# Patient Record
Sex: Male | Born: 1945 | Race: White | Hispanic: No | Marital: Married | Smoking: Former smoker
Health system: Southern US, Community
[De-identification: ages and names within clinical notes are randomized; demographics above are authoritative.]

## PROBLEM LIST (undated history)

## (undated) DIAGNOSIS — N529 Male erectile dysfunction, unspecified: Secondary | ICD-10-CM

## (undated) DIAGNOSIS — F329 Major depressive disorder, single episode, unspecified: Secondary | ICD-10-CM

## (undated) DIAGNOSIS — J342 Deviated nasal septum: Secondary | ICD-10-CM

## (undated) DIAGNOSIS — I10 Essential (primary) hypertension: Secondary | ICD-10-CM

## (undated) DIAGNOSIS — E785 Hyperlipidemia, unspecified: Secondary | ICD-10-CM

## (undated) DIAGNOSIS — H9319 Tinnitus, unspecified ear: Secondary | ICD-10-CM

## (undated) DIAGNOSIS — F419 Anxiety disorder, unspecified: Secondary | ICD-10-CM

## (undated) DIAGNOSIS — K5790 Diverticulosis of intestine, part unspecified, without perforation or abscess without bleeding: Secondary | ICD-10-CM

## (undated) DIAGNOSIS — E669 Obesity, unspecified: Secondary | ICD-10-CM

## (undated) DIAGNOSIS — F32A Depression, unspecified: Secondary | ICD-10-CM

## (undated) DIAGNOSIS — K649 Unspecified hemorrhoids: Secondary | ICD-10-CM

## (undated) DIAGNOSIS — G4733 Obstructive sleep apnea (adult) (pediatric): Secondary | ICD-10-CM

## (undated) DIAGNOSIS — K8020 Calculus of gallbladder without cholecystitis without obstruction: Secondary | ICD-10-CM

## (undated) DIAGNOSIS — I2510 Atherosclerotic heart disease of native coronary artery without angina pectoris: Secondary | ICD-10-CM

## (undated) DIAGNOSIS — I219 Acute myocardial infarction, unspecified: Secondary | ICD-10-CM

## (undated) HISTORY — DX: Essential (primary) hypertension: I10

## (undated) HISTORY — DX: Depression, unspecified: F32.A

## (undated) HISTORY — DX: Hyperlipidemia, unspecified: E78.5

## (undated) HISTORY — PX: TONSILLECTOMY: SUR1361

## (undated) HISTORY — PX: APPENDECTOMY: SHX54

## (undated) HISTORY — DX: Tinnitus, unspecified ear: H93.19

## (undated) HISTORY — DX: Major depressive disorder, single episode, unspecified: F32.9

## (undated) HISTORY — DX: Male erectile dysfunction, unspecified: N52.9

## (undated) HISTORY — DX: Deviated nasal septum: J34.2

## (undated) HISTORY — PX: SPLENECTOMY: SUR1306

## (undated) HISTORY — DX: Obesity, unspecified: E66.9

## (undated) HISTORY — DX: Obstructive sleep apnea (adult) (pediatric): G47.33

---

## 2001-08-27 HISTORY — PX: CORONARY ANGIOPLASTY WITH STENT PLACEMENT: SHX49

## 2002-07-31 ENCOUNTER — Emergency Department (HOSPITAL_COMMUNITY): Admission: EM | Admit: 2002-07-31 | Discharge: 2002-07-31 | Payer: Self-pay | Admitting: Internal Medicine

## 2002-07-31 ENCOUNTER — Encounter: Payer: Self-pay | Admitting: Internal Medicine

## 2002-08-03 ENCOUNTER — Inpatient Hospital Stay (HOSPITAL_COMMUNITY): Admission: RE | Admit: 2002-08-03 | Discharge: 2002-08-06 | Payer: Self-pay | Admitting: *Deleted

## 2002-08-03 ENCOUNTER — Encounter: Payer: Self-pay | Admitting: *Deleted

## 2003-06-26 ENCOUNTER — Inpatient Hospital Stay (HOSPITAL_COMMUNITY): Admission: EM | Admit: 2003-06-26 | Discharge: 2003-06-27 | Payer: Self-pay

## 2006-12-03 ENCOUNTER — Emergency Department (HOSPITAL_COMMUNITY): Admission: EM | Admit: 2006-12-03 | Discharge: 2006-12-03 | Payer: Self-pay | Admitting: Emergency Medicine

## 2011-03-22 ENCOUNTER — Other Ambulatory Visit: Payer: Self-pay | Admitting: Neurology

## 2011-03-22 DIAGNOSIS — G479 Sleep disorder, unspecified: Secondary | ICD-10-CM

## 2011-03-22 DIAGNOSIS — R443 Hallucinations, unspecified: Secondary | ICD-10-CM

## 2011-04-10 ENCOUNTER — Ambulatory Visit
Admission: RE | Admit: 2011-04-10 | Discharge: 2011-04-10 | Disposition: A | Discharge status: Home / Self Care | Payer: Federal, State, Local not specified - PPO | Source: Ambulatory Visit | Attending: Neurology | Admitting: Neurology

## 2011-04-10 DIAGNOSIS — G479 Sleep disorder, unspecified: Secondary | ICD-10-CM

## 2011-04-10 DIAGNOSIS — R443 Hallucinations, unspecified: Secondary | ICD-10-CM

## 2011-11-09 DIAGNOSIS — J329 Chronic sinusitis, unspecified: Secondary | ICD-10-CM | POA: Diagnosis not present

## 2011-11-09 DIAGNOSIS — I10 Essential (primary) hypertension: Secondary | ICD-10-CM | POA: Diagnosis not present

## 2011-11-09 DIAGNOSIS — G4733 Obstructive sleep apnea (adult) (pediatric): Secondary | ICD-10-CM | POA: Diagnosis not present

## 2011-11-09 DIAGNOSIS — J301 Allergic rhinitis due to pollen: Secondary | ICD-10-CM | POA: Diagnosis not present

## 2011-12-17 DIAGNOSIS — H9319 Tinnitus, unspecified ear: Secondary | ICD-10-CM | POA: Diagnosis not present

## 2011-12-17 DIAGNOSIS — J329 Chronic sinusitis, unspecified: Secondary | ICD-10-CM | POA: Diagnosis not present

## 2011-12-17 DIAGNOSIS — J342 Deviated nasal septum: Secondary | ICD-10-CM | POA: Diagnosis not present

## 2011-12-20 DIAGNOSIS — J329 Chronic sinusitis, unspecified: Secondary | ICD-10-CM | POA: Diagnosis not present

## 2012-01-10 DIAGNOSIS — J310 Chronic rhinitis: Secondary | ICD-10-CM | POA: Diagnosis not present

## 2012-01-10 DIAGNOSIS — J342 Deviated nasal septum: Secondary | ICD-10-CM | POA: Diagnosis not present

## 2012-01-10 DIAGNOSIS — J320 Chronic maxillary sinusitis: Secondary | ICD-10-CM | POA: Diagnosis not present

## 2012-01-10 DIAGNOSIS — H9319 Tinnitus, unspecified ear: Secondary | ICD-10-CM | POA: Diagnosis not present

## 2012-01-31 DIAGNOSIS — J342 Deviated nasal septum: Secondary | ICD-10-CM | POA: Diagnosis not present

## 2012-01-31 DIAGNOSIS — J320 Chronic maxillary sinusitis: Secondary | ICD-10-CM | POA: Diagnosis not present

## 2012-03-26 DIAGNOSIS — I10 Essential (primary) hypertension: Secondary | ICD-10-CM | POA: Diagnosis not present

## 2012-03-26 DIAGNOSIS — J320 Chronic maxillary sinusitis: Secondary | ICD-10-CM | POA: Diagnosis not present

## 2012-03-27 HISTORY — PX: CORONARY ANGIOPLASTY WITH STENT PLACEMENT: SHX49

## 2012-04-08 DIAGNOSIS — E669 Obesity, unspecified: Secondary | ICD-10-CM | POA: Diagnosis not present

## 2012-04-08 DIAGNOSIS — I10 Essential (primary) hypertension: Secondary | ICD-10-CM | POA: Diagnosis not present

## 2012-04-08 DIAGNOSIS — R0789 Other chest pain: Secondary | ICD-10-CM | POA: Diagnosis not present

## 2012-04-09 ENCOUNTER — Encounter: Payer: Self-pay | Admitting: *Deleted

## 2012-04-10 ENCOUNTER — Other Ambulatory Visit: Payer: Self-pay

## 2012-04-10 ENCOUNTER — Ambulatory Visit (HOSPITAL_COMMUNITY): Admit: 2012-04-10 | Payer: Federal, State, Local not specified - PPO | Admitting: Cardiovascular Disease

## 2012-04-10 ENCOUNTER — Encounter (HOSPITAL_COMMUNITY): Payer: Self-pay | Admitting: Emergency Medicine

## 2012-04-10 ENCOUNTER — Encounter: Payer: Self-pay | Admitting: Cardiology

## 2012-04-10 ENCOUNTER — Encounter: Payer: Federal, State, Local not specified - PPO | Admitting: Cardiology

## 2012-04-10 ENCOUNTER — Encounter (HOSPITAL_COMMUNITY)
Admission: EM | Disposition: A | Discharge status: Home / Self Care | Payer: Self-pay | Source: Home / Self Care | Attending: Cardiovascular Disease

## 2012-04-10 ENCOUNTER — Emergency Department (HOSPITAL_COMMUNITY): Payer: Federal, State, Local not specified - PPO

## 2012-04-10 ENCOUNTER — Inpatient Hospital Stay (HOSPITAL_COMMUNITY)
Admission: EM | Admit: 2012-04-10 | Discharge: 2012-04-13 | DRG: 853 | Disposition: A | Discharge status: Home / Self Care | Payer: Federal, State, Local not specified - PPO | Attending: Cardiovascular Disease | Admitting: Cardiovascular Disease

## 2012-04-10 DIAGNOSIS — E785 Hyperlipidemia, unspecified: Secondary | ICD-10-CM | POA: Diagnosis present

## 2012-04-10 DIAGNOSIS — Z87891 Personal history of nicotine dependence: Secondary | ICD-10-CM

## 2012-04-10 DIAGNOSIS — Z955 Presence of coronary angioplasty implant and graft: Secondary | ICD-10-CM

## 2012-04-10 DIAGNOSIS — I2510 Atherosclerotic heart disease of native coronary artery without angina pectoris: Secondary | ICD-10-CM

## 2012-04-10 DIAGNOSIS — D72829 Elevated white blood cell count, unspecified: Secondary | ICD-10-CM | POA: Diagnosis present

## 2012-04-10 DIAGNOSIS — F329 Major depressive disorder, single episode, unspecified: Secondary | ICD-10-CM | POA: Diagnosis present

## 2012-04-10 DIAGNOSIS — I213 ST elevation (STEMI) myocardial infarction of unspecified site: Secondary | ICD-10-CM | POA: Diagnosis present

## 2012-04-10 DIAGNOSIS — I2109 ST elevation (STEMI) myocardial infarction involving other coronary artery of anterior wall: Secondary | ICD-10-CM

## 2012-04-10 DIAGNOSIS — E876 Hypokalemia: Secondary | ICD-10-CM | POA: Diagnosis not present

## 2012-04-10 DIAGNOSIS — R079 Chest pain, unspecified: Secondary | ICD-10-CM | POA: Diagnosis not present

## 2012-04-10 DIAGNOSIS — R7309 Other abnormal glucose: Secondary | ICD-10-CM | POA: Diagnosis present

## 2012-04-10 DIAGNOSIS — G4733 Obstructive sleep apnea (adult) (pediatric): Secondary | ICD-10-CM | POA: Diagnosis present

## 2012-04-10 DIAGNOSIS — I517 Cardiomegaly: Secondary | ICD-10-CM | POA: Diagnosis not present

## 2012-04-10 DIAGNOSIS — E669 Obesity, unspecified: Secondary | ICD-10-CM | POA: Diagnosis present

## 2012-04-10 DIAGNOSIS — I10 Essential (primary) hypertension: Secondary | ICD-10-CM | POA: Diagnosis not present

## 2012-04-10 DIAGNOSIS — F3289 Other specified depressive episodes: Secondary | ICD-10-CM | POA: Diagnosis present

## 2012-04-10 DIAGNOSIS — I219 Acute myocardial infarction, unspecified: Secondary | ICD-10-CM | POA: Diagnosis not present

## 2012-04-10 DIAGNOSIS — Z79899 Other long term (current) drug therapy: Secondary | ICD-10-CM

## 2012-04-10 HISTORY — PX: LEFT HEART CATHETERIZATION WITH CORONARY ANGIOGRAM: SHX5451

## 2012-04-10 LAB — PROTIME-INR: INR: 0.96 (ref 0.00–1.49)

## 2012-04-10 LAB — CBC
HCT: 40.7 % (ref 39.0–52.0)
MCH: 31.6 pg (ref 26.0–34.0)
MCH: 31.6 pg (ref 26.0–34.0)
MCHC: 35.6 g/dL (ref 30.0–36.0)
MCV: 88.6 fL (ref 78.0–100.0)
MCV: 88.1 fL (ref 78.0–100.0)
Platelets: 323 10*3/uL (ref 150–400)
RBC: 4.62 MIL/uL (ref 4.22–5.81)
RDW: 13.9 % (ref 11.5–15.5)
WBC: 20.3 10*3/uL — ABNORMAL HIGH (ref 4.0–10.5)

## 2012-04-10 LAB — COMPREHENSIVE METABOLIC PANEL
AST: 39 U/L — ABNORMAL HIGH (ref 0–37)
Albumin: 4.4 g/dL (ref 3.5–5.2)
CO2: 28 mEq/L (ref 19–32)
Calcium: 10.4 mg/dL (ref 8.4–10.5)
Creatinine, Ser: 1.23 mg/dL (ref 0.50–1.35)
GFR calc non Af Amer: 59 mL/min — ABNORMAL LOW (ref >90)
Total Protein: 8.6 g/dL — ABNORMAL HIGH (ref 6.0–8.3)

## 2012-04-10 LAB — CARDIAC PANEL(CRET KIN+CKTOT+MB+TROPI): CK, MB: 129.1 ng/mL (ref 0.3–4.0)

## 2012-04-10 LAB — APTT: aPTT: 24 seconds (ref 24–37)

## 2012-04-10 LAB — POCT ACTIVATED CLOTTING TIME: Activated Clotting Time: 160 seconds

## 2012-04-10 SURGERY — LEFT HEART CATHETERIZATION WITH CORONARY ANGIOGRAM
Anesthesia: LOCAL

## 2012-04-10 MED ORDER — ONDANSETRON HCL 4 MG/2ML IJ SOLN
INTRAMUSCULAR | Status: AC
  Administered 2012-04-10: 4 mg
  Filled 2012-04-10: qty 2

## 2012-04-10 MED ORDER — NITROGLYCERIN IN D5W 200-5 MCG/ML-% IV SOLN
5.0000 ug/min | Freq: Once | INTRAVENOUS | Status: AC
  Administered 2012-04-10: 5 ug/min via INTRAVENOUS

## 2012-04-10 MED ORDER — EPTIFIBATIDE 75 MG/100ML IV SOLN
INTRAVENOUS | Status: AC
  Administered 2012-04-10: 2.003 ug/kg/min via INTRAVENOUS
  Filled 2012-04-10: qty 100

## 2012-04-10 MED ORDER — ACETAMINOPHEN 325 MG PO TABS
650.0000 mg | ORAL_TABLET | ORAL | Status: DC | PRN
  Administered 2012-04-11 – 2012-04-12 (×4): 650 mg via ORAL
  Filled 2012-04-10 (×4): qty 2

## 2012-04-10 MED ORDER — SODIUM CHLORIDE 0.9 % IV SOLN
0.2500 mg/kg/h | INTRAVENOUS | Status: DC
  Filled 2012-04-10: qty 250

## 2012-04-10 MED ORDER — SODIUM CHLORIDE 0.9 % IV SOLN: INTRAVENOUS | Status: AC

## 2012-04-10 MED ORDER — MORPHINE SULFATE 4 MG/ML IJ SOLN
INTRAMUSCULAR | Status: AC
  Administered 2012-04-10: 4 mg
  Filled 2012-04-10: qty 1

## 2012-04-10 MED ORDER — HYDRALAZINE HCL 20 MG/ML IJ SOLN
INTRAMUSCULAR | Status: AC
  Filled 2012-04-10: qty 1

## 2012-04-10 MED ORDER — SODIUM CHLORIDE 0.9 % IV SOLN
INTRAVENOUS | Status: DC
  Filled 2012-04-10: qty 250

## 2012-04-10 MED ORDER — PRASUGREL HCL 10 MG PO TABS
10.0000 mg | ORAL_TABLET | Freq: Every day | ORAL | Status: DC
  Administered 2012-04-11 – 2012-04-13 (×3): 10 mg via ORAL
  Filled 2012-04-10 (×3): qty 1

## 2012-04-10 MED ORDER — HYDRALAZINE HCL 20 MG/ML IJ SOLN: 10.0000 mg | INTRAMUSCULAR | Status: DC | PRN

## 2012-04-10 MED ORDER — HEPARIN (PORCINE) IN NACL 100-0.45 UNIT/ML-% IJ SOLN
INTRAMUSCULAR | Status: AC
  Filled 2012-04-10: qty 250

## 2012-04-10 MED ORDER — NITROGLYCERIN IN D5W 200-5 MCG/ML-% IV SOLN
INTRAVENOUS | Status: AC
  Filled 2012-04-10: qty 250

## 2012-04-10 MED ORDER — HEPARIN BOLUS VIA INFUSION
4000.0000 [IU] | Freq: Once | INTRAVENOUS | Status: AC
  Administered 2012-04-10: 4000 [IU] via INTRAVENOUS

## 2012-04-10 MED ORDER — METOPROLOL TARTRATE 1 MG/ML IV SOLN
INTRAVENOUS | Status: AC
  Filled 2012-04-10: qty 5

## 2012-04-10 MED ORDER — FENTANYL CITRATE 0.05 MG/ML IJ SOLN
INTRAMUSCULAR | Status: AC
  Filled 2012-04-10: qty 2

## 2012-04-10 MED ORDER — LIDOCAINE HCL (PF) 1 % IJ SOLN
INTRAMUSCULAR | Status: AC
  Filled 2012-04-10: qty 30

## 2012-04-10 MED ORDER — NITROGLYCERIN IN D5W 200-5 MCG/ML-% IV SOLN: 2.0000 ug/min | INTRAVENOUS | Status: DC

## 2012-04-10 MED ORDER — NITROGLYCERIN 0.2 MG/ML ON CALL CATH LAB
INTRAVENOUS | Status: AC
  Filled 2012-04-10: qty 1

## 2012-04-10 MED ORDER — MORPHINE SULFATE 10 MG/ML IJ SOLN
INTRAMUSCULAR | Status: AC
  Filled 2012-04-10: qty 1

## 2012-04-10 MED ORDER — HEPARIN (PORCINE) IN NACL 100-0.45 UNIT/ML-% IJ SOLN
1000.0000 [IU]/h | INTRAMUSCULAR | Status: DC
  Administered 2012-04-10: 1000 [IU]/h via INTRAVENOUS
  Filled 2012-04-10: qty 250

## 2012-04-10 MED ORDER — METOPROLOL TARTRATE 1 MG/ML IV SOLN: 5.0000 mg | Freq: Once | INTRAVENOUS | Status: DC

## 2012-04-10 MED ORDER — MIDAZOLAM HCL 2 MG/2ML IJ SOLN
INTRAMUSCULAR | Status: AC
  Filled 2012-04-10: qty 2

## 2012-04-10 MED ORDER — PRASUGREL HCL 10 MG PO TABS
ORAL_TABLET | ORAL | Status: AC
  Filled 2012-04-10: qty 6

## 2012-04-10 MED ORDER — ONDANSETRON HCL 4 MG/2ML IJ SOLN
4.0000 mg | Freq: Four times a day (QID) | INTRAMUSCULAR | Status: DC | PRN
  Filled 2012-04-10: qty 2

## 2012-04-10 MED ORDER — SODIUM CHLORIDE 0.9 % IV SOLN
1000.0000 mL | INTRAVENOUS | Status: DC
Start: 2012-04-10 — End: 2012-04-10

## 2012-04-10 MED ORDER — METOPROLOL TARTRATE 25 MG PO TABS
25.0000 mg | ORAL_TABLET | Freq: Two times a day (BID) | ORAL | Status: DC
  Administered 2012-04-10 – 2012-04-13 (×6): 25 mg via ORAL
  Filled 2012-04-10 (×8): qty 1

## 2012-04-10 MED ORDER — ATORVASTATIN CALCIUM 80 MG PO TABS
80.0000 mg | ORAL_TABLET | Freq: Every day | ORAL | Status: DC
  Administered 2012-04-10 – 2012-04-12 (×3): 80 mg via ORAL
  Filled 2012-04-10 (×4): qty 1

## 2012-04-10 MED ORDER — HEPARIN (PORCINE) IN NACL 2-0.9 UNIT/ML-% IJ SOLN
INTRAMUSCULAR | Status: AC
  Filled 2012-04-10: qty 3000

## 2012-04-10 MED ORDER — BIVALIRUDIN 250 MG IV SOLR
INTRAVENOUS | Status: AC
  Filled 2012-04-10: qty 250

## 2012-04-10 MED ORDER — EPTIFIBATIDE 75 MG/100ML IV SOLN
2.0000 ug/kg/min | INTRAVENOUS | Status: DC
  Administered 2012-04-10: 2.003 ug/kg/min via INTRAVENOUS
  Administered 2012-04-11: 2 ug/kg/min via INTRAVENOUS
  Filled 2012-04-10 (×3): qty 100

## 2012-04-10 MED ORDER — POTASSIUM CHLORIDE CRYS ER 20 MEQ PO TBCR: 60.0000 meq | EXTENDED_RELEASE_TABLET | Freq: Once | ORAL | Status: DC

## 2012-04-10 NOTE — CV Procedure (Signed)
Cardiac Catheterization Operative Report  Bradley Hurley [id number] 8/15/20134:57 PM [name], MD  Procedure Performed:  1. Left Heart Catheterization 2. Selective Coronary Angiography 3. Left ventricular angiogram 4. Thrombectomy of the mid LAD with an Expressway thrombectomy catheter 5. PTCA/DES x 2 proximal and mid LAD  Operator: Verne Carrow, MD  Indication:  Acute anterior STEMI                                  Procedure Details: The patient was brought to the cath lab via EMS. He received 4000 Units heparin IV per EMS. No ASA secondary to ASA allergy. Emergency consent was obtained. The risks, benefits, complications, treatment options, and expected outcomes were discussed with the patient. The patient and/or family concurred with the proposed plan, giving verbal consent. The patient was brought to the cath lab after IV hydration was begun and oral premedication was given. The patient was further sedated with Versed and Fentanyl. The right groin was prepped and draped in the usual manner. Using the modified Seldinger access technique, a 6 French sheath was placed in the right femoral artery. A JR4 catheter was used to engage the RCA and perform selective angiography. I then engaged the left main with a XB LAD 3.5 guiding catheter. Angiography was performed. The LAD was totally occluded in the mid segment at the proximal edge of the old stent. He was given a bolus of Angiomax and a drip was started. He was given 60 mg po Effient. I then advanced a Cougar wire down the LAD through the totally occluded segment. Flow was re-established with the wire. I then used a 2.5 x 12 mm balloon x 1 to open the stenotic area. This was just prior to the old stent. There was a long segment of disease extending from the proximal vessel down through the more distal old stent. I deployed a 3.0 x 38 mm Promus Element DES in the mid vessel. I then deployed an overlapping 3.5 x 20 mm Promus  Element DES in the proximal vessel. The most distal newly placed stent was post-dilated with a 3.0 x 20 mm Harlan balloon. The more proximal newly placed stent was post-dilated with a 3.5 x 20 mm [place] balloon. There was an excellent result proximally and in the mid segment. The old stent in the mid vessel had a slight filling defect on the distal edge. The remainder of the distal LAD was diffusely diseased. I placed my 2.5 x 12 mm balloon in the distal edge of the old stent and inflated x 2 to see if this affected the small filling defect. This is likely residual downstream thrombus. The mid and distal LAD was too small and diffusely diseased for further stenting. I gave multiple rounds of NTG IC. He was given a bolus of Integrilin and a drip was started. The Angiomax was turned off.  I elected to treat him with Integrilin overnight in an attempt to allow the thrombus distally to break up. The guide and wire were removed. A pigtail catheter was used to perform a left ventricular angiogram. Right femoral artery angiogram showed that there was a high bifurcation and the sheath was in the profunda so no closure device was used.   There were no immediate complications. The patient was taken to the recovery area in stable condition.   Hemodynamic Findings: Central aortic pressure: 180/101 Left ventricular pressure: 172/11/15  Angiographic Findings:  Left  main:  Distal 10% plaque.   Left Anterior Descending Artery: Large caliber vessel that courses the apex. The proximal vessel has diffuse 50% stenosis. The mid vessel has 100% total occlusion just prior to the old stent. Two stents are visible in the mid vessel. There is a 5-8 mm area of vessel that is not stented between the stents.   Circumflex Artery: Large caliber vessel that terminates into a marginal branch. There is a 40% stenosis in the mid portion of the marginal branch.   Right Coronary Artery: Large dominant vessel with luminal irregularities in the  proximal and mid segment. The distal vessel has serial 30% stenoses. The PDA is a moderate caliber vessel with mild diffuse plaque. The PLA is a moderate sized vessel with mild diffuse plaque.   Left Ventricular Angiogram: LVEF=40%. Hypokinesis of the anterior wall and apex.   Impression: 1. Acute anterior STEMI 2. Occluded mid LAD, now s/p successful thrombectomy and PTCA with placement of 2 DES in the proximal and mid LAD.  3. Segment LV systolic dysfunction  Recommendations: Will continue Effient for one year. He is allergic to ASA. Start beta blocker, statin. Will continue Integrilin drip for 18 hours with large thrombus burden. Will continue NTG gtt with elevated BP. Echo in am. CCU given large anterior MI.        Complications:  None. The patient tolerated the procedure well.

## 2012-04-10 NOTE — Progress Notes (Signed)
CRITICAL VALUE ALERT  Critical value received:  ckmb 129.9, troponin >20.0  Date of notification:  04/10/12  Time of notification:  2000  Critical value read back:yes  Nurse who received alert:  Hillery Aldo RN  MD notified (1st page):  J. Hope   Time of first page:  2030  MD notified (2nd page):  Time of second page:  Responding MD:  J. Hope  Time MD responded:  2045

## 2012-04-10 NOTE — ED Provider Notes (Signed)
History  This chart was scribed for Carleene Cooper III, MD by Erskine Emery. This patient was seen in room APA07/APA07 and the patient's care was started at 14:30.    CSN: [id number]  Arrival date & time 04/10/12  1433   None     No chief complaint on file.   (Consider location/radiation/quality/duration/timing/severity/associated sxs/prior treatment) HPI Bradley Hurley is a 66 y.o. male who presents to the Emergency Department complaining of sudden onset severe chest pain with associated numbness but no radiation for about 10 minutes. Pt reports he has had some mild chest pain for 3 days but reports he slept fine last night. Pt had an appointment with [place] at 2:30 today but came here instead. Pt has not taken an aspirin because he is allergic. Pt has a h/o stent placement (2) in 2003 at [facility]. Pt also has a h/o ASCVD, HTN, hyperlipidemia, and obstructive sleep apnea and is on flonase, hydrodiurl, lisinopril, and pravachol. Pt does not smoke (he quit in 1982 after 15 years) and he drinks a little.   Past Medical History  Diagnosis Date  . 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 Hypertension     Lab 03/2012: Normal CMet, TSH, CBC, PSA  . Hyperlipidemia     Lipid profile in 03/2012:182, 409,81,191  . Obstructive sleep apnea 05/2011    [facility]-05/2011  . Tinnitus   . Erectile dysfunction     Low serum testosterone level  . Depression   . Obesity   . Deviated septum   . 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 Splenectomy   . Appendectomy     Family History  Problem Relation Age of Onset  . Arrhythmia Father   . Cancer Mother     History  Substance Use Topics  . Smoking status: Former Smoker -- 1.0 packs/day for 15 years  .  Smokeless tobacco: Former Neurosurgeon    Quit date: 01/30/1981   Comment: Quit in 1982  . Alcohol Use: 0.5 oz/week    1 drink(s) per week     Occational consumption      Review of Systems  Constitutional: Negative for fever, chills and activity change.  HENT: Negative for neck pain.   Eyes: Negative for discharge.  Respiratory: Negative for cough.   Cardiovascular: Positive for chest pain.  Gastrointestinal: Negative for nausea, vomiting and diarrhea.  Genitourinary: Negative for dysuria.  Musculoskeletal: Negative for back pain.  Skin: Negative for rash.  Neurological: Negative for syncope.  Psychiatric/Behavioral: Negative for confusion and disturbed wake/sleep cycle.    Allergies  Aspirin  Home Medications   Current Outpatient Rx  Name Route Sig Dispense Refill  . FLUTICASONE PROPIONATE 50 MCG/ACT NA SUSP Nasal Place 2 sprays into the nose daily.    Marland Kitchen HYDROCHLOROTHIAZIDE 25 MG PO TABS Oral Take 25 mg by mouth daily.    Marland Kitchen LISINOPRIL 20 MG PO TABS Oral Take 20 mg by mouth daily.    Marland Kitchen PRAVASTATIN SODIUM 40 MG PO TABS Oral Take 40 mg by mouth daily.      There were no vitals taken for this visit.  Physical Exam  Nursing note and vitals reviewed. Constitutional: He is oriented to person, place, and time. He appears well-developed and well-nourished.  No distress.  HENT:  Head: Normocephalic and atraumatic.  Eyes: EOM are normal.  Neck: Neck supple. No tracheal deviation present.  Pulmonary/Chest: He exhibits tenderness.       EKG shows acute MI  Abdominal: Soft. He exhibits no distension. There is no tenderness.  Musculoskeletal: Normal range of motion. He exhibits no edema.  Neurological: He is alert and oriented to person, place, and time.  Skin: Skin is warm and dry.  Psychiatric: He has a normal mood and affect.    ED Course  Procedures (including critical care time)   COORDINATION OF CARE: 14:30--I evaluated the patient and we discussed a treatment plan  including morphine, zophran, and nitroglycerine to which the pt agreed.  Pt's EKG shows acute MI.   14:56--I rechecked the pt and ordered him more pain medication (4 mg/ml of morphine)  15:00--I notified Dr. Gwyneth Sprout in [facility] ED that the pt would be there soon.    Date: 04/10/2012  Rate: 68  Rhythm: normal sinus rhythm  QRS Axis: normal  Intervals: normal QRS:  ST elevation in V1 - V4, Acute anterior myocardial infarction.  ST/T Wave abnormalities: ST elevations anteriorly  Conduction Disutrbances:none  Narrative Interpretation: Acute anterior myocardial infarction.  Old EKG Reviewed: none available  2:48 PM Pt seen --> physical exam performed.  EKG shows acute anterior myocardial infarction.  Call to [facility] STEMI --> spoke to Dr. Herbie Baltimore, who accepts pt in transfer to [facility].  He will notify [facility].         1. Acute myocardial infarction      I personally performed the services described in this documentation, which was scribed in my presence. The recorded information has been reviewed and considered.  Osvaldo Human, M.D.       Carleene Cooper III, MD 04/11/12 [phone]

## 2012-04-10 NOTE — Plan of Care (Signed)
Problem: Phase I Progression Outcomes Goal: Aspirin unless contraindicated Outcome: Completed/Met Date Met:  04/10/12 Pt allergic to ASA

## 2012-04-10 NOTE — H&P (Signed)
History and Physical  Patient ID: Bradley Hurley MRN: [id number], DOB: [birth year] Date of Encounter: 04/10/2012, 3:48 PM Primary Physician: [name], MD Primary Cardiologist: was scheduled to see Dr. Dietrich Pates for the first time today  Chief Complaint: chest pain Reason for Admission: anterior STEMI  HPI: Bradley Hurley is a 66 y/o M with hx of known CAD s/p LAD stenting x 2 in 2003 who presented to the [facility] ER with chest pain and was found to have anterior STEMI by EKG. He was actually scheduled to see Dr. Dietrich Pates this afternoon as a new patient -- he was formerly followed by [facility] & recently requested to establish followup with [place]. He has been having intermittent chest pain approximately 3 days. He was feeling okay this afternoon but while getting ready for his appointment around 1:50pm, he developed substernal chest pain associated with diaphoresis. No SOB, nausea or vomiting. His pain got progressively worse, eventually radiating to his back so he went to [facility] ER where EKG demonstrated NSR 68bpm with acute ST elevation anteriorly V1-V5 up to 4mm in V2 with reciprocal inferior changes. He was in so much distress that he was apparently screaming in pain there. At [facility], was given heparin bolus and gtt, NTG gtt, Zofran, and 8mg  morphine plus received an additional 2mg  by [facility]. He was [facility] emerg[facility]. He was comfortable and pain free upon arrival. He did not receive ASA prior to cath because of reported ASA allergy. Initial labs show WBC 17.9, first troponin of 1.47. CXR showed borderline cardiomegaly without acute disease.  He denies any interim medical history since discharge in 2003, except for a hospitalization for CP which he ruled out for MI and was told he had reflux. He denies any diabetes, stroke, liver dz, kidney dz, or bleeding problems.   Past Medical History  Diagnosis Date  . 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 Hypertension     Lab 03/2012: Normal CMet, TSH, CBC, PSA  . Hyperlipidemia     Lipid profile in 03/2012:182, 409,81,191  . Obstructive sleep apnea 05/2011    [facility]-05/2011  . Tinnitus   . Erectile dysfunction     Low serum testosterone level  . Depression   . Obesity   . Deviated septum   . 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 Splenectomy   . Appendectomy      Home Meds: Prior to Admission medications   Medication Sig Start Date End Date Taking? Authorizing Provider  fluticasone (FLONASE) 50 MCG/ACT nasal spray Place 2 sprays into the nose daily.    Historical Provider, MD  hydrochlorothiazide (HYDRODIURIL) 25 MG tablet Take 25 mg by mouth daily.    Historical Provider, MD  lisinopril (PRINIVIL,ZESTRIL) 20 MG tablet Take 20 mg by mouth daily.    Historical Provider, MD  pravastatin (PRAVACHOL) 40 MG tablet Take 40 mg by mouth daily.    Historical Provider, MD    Allergies:  Allergies  Allergen Reactions  . Aspirin     Throat closes    History   Social History  . Marital Status: Married    Spouse Name: N/A    Number  of Children: N/A  . Years of Education: N/A   Occupational History  . 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 Smoking status: Former Smoker -- 1.0 packs/day for 15 years  . Smokeless tobacco: Former Neurosurgeon    Quit date: 01/30/1981   Comment: Quit in 1982  . Alcohol Use: 0.5 oz/week    1 drink(s) per week     Rare consumption - once/twice per week  . Drug Use: No  . Sexually Active: Not on file   Other Topics Concern  . 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     Family History    Problem Relation Age of Onset  . Arrhythmia Father   . Cancer Mother     Review of Systems: General: negative for chills, fever Cardiovascular: see above Respiratory: negative for cough Urologic: negative for hematuria Abdominal: negative for nausea, vomiting, diarrhea, bright red blood per rectum, melena, or hematemesis Neurologic: negative for visual changes, syncope, or dizziness All other systems reviewed and are otherwise negative except as noted above.  Labs:   Lab Results  Component Value Date   WBC 17.9* 04/10/2012   HGB 16.0 04/10/2012   HCT 44.9 04/10/2012   MCV 88.6 04/10/2012   PLT 323 04/10/2012     Lab 04/10/12 1436  NA 138  K 3.2*  CL 97  CO2 28  BUN 15  CREATININE 1.23  CALCIUM 10.4  PROT 8.6*  BILITOT 0.5  ALKPHOS 48  ALT 24  AST 39*  GLUCOSE 134*   First POC troponin 1.47  Radiology/Studies:  1.  Chest Portable 1 View 04/10/2012  *RADIOLOGY REPORT*  Clinical Data: Chest pain and hypertension  PORTABLE CHEST - 1 VIEW  Comparison: None of the  Findings: Heart size appears upper normal to mildly prominent for portable technique.  Coronary artery stents are noted.  The pulmonary vascularity is within normal limits.  The lung volumes are low bilaterally.  No focal airspace disease, effusion, or edema is identified.  No evidence of pneumothorax.  Visualized bony thorax is unremarkable.  IMPRESSION: 1. Low lung volumes.  No acute findings identified. 2.  Borderline/mild cardiomegaly with coronary artery stents visible.  Original Report Authenticated By: Britta Mccreedy, M.D.    EKG: NSR 68bpm with acute ST elevation anteriorly V1-V5 up to 4mm in V2 with reciprocal inferior changes  Physical Exam:brief given acute situation Blood pressure 144/108, pulse 66, temperature 98.5 F (36.9 C), temperature source Oral, resp. rate 23, SpO2 100.00%. Repeat BP by [facility] 160 systolic General: Well developed flushed WM in no acute distress. Head: Normocephalic, atraumatic,  sclera non-icteric, no xanthomas, nares are without discharge.  Neck: JVD not elevated. Lungs: Clear bilaterally to auscultation without wheezes, rales, or rhonchi. Breathing is unlabored. Heart: RRR with S1 S2. No murmurs, rubs, or gallops appreciated. Abdomen: Soft, non-tender, non-distended with normoactive bowel sounds. No hepatomegaly. No rebound/guarding. No obvious abdominal masses. Msk:  Strength and tone appear normal for age. Extremities: No clubbing or cyanosis. No edema.  Distal pedal pulses are 2+ and equal bilaterally. Neuro: Alert and oriented X 3. Moves all extremities spontaneously. Psych:  Responds to questions appropriately with a normal affect.   ASSESSMENT AND PLAN:   1. Acute anterior STEMI with hx of known CAD with prior LAD stenting  - for emergent cath. Tentatively has been loaded with Effient. Note ASA allergy.  2. HTN - BP meds  will need adjusting. 3. HL - consider change to more potent statin. 4. Hypokalemia - will need repletion. May be due to home HCTZ. 5. Leukocytosis - likely reactive to STEMI. Trend. Afebrile and no evidence of infection preliminarily. 6. Hyperglycemia - no hx of diabetes. Could be related to stress of acute illness but check A1C.  Further meds/recs per MD based on coronary anatomy.  Signed, Ronie Spies PA-C 04/10/2012, 3:48 PM  I have personally seen and examined this patient with Ronie Spies, PA-C. I agree with the assessment and plan as outlined above. Emergent cath today. Plans to follow.   MCALHANY,CHRISTOPHER\ 5:34 PM 04/10/2012

## 2012-04-10 NOTE — ED Notes (Signed)
Report given to [facility].  Pt. Left via ambulance.

## 2012-04-10 NOTE — ED Notes (Addendum)
Increased pain, gave addt'l. 4mg  Morphine.  Increased NTG to 20 mcg.

## 2012-04-10 NOTE — ED Notes (Signed)
I stat troponin running at this time. Ekg mused and given to DR. Ignacia Palma as well. [name]

## 2012-04-10 NOTE — ED Notes (Signed)
Pt c/o chest pain x 3 days and had an appt with [facility]auer at 230.

## 2012-04-10 NOTE — ED Notes (Signed)
[facility] has been called for [name] also [facility] called and in route to transport the Pt.

## 2012-04-10 NOTE — Progress Notes (Signed)
Rt groin sheath pulled. No bleeding issues noted during sheath pull. Pull had minimal bleeding. Pressure dressing applied after bleeding stopped and monitored by nurse for 20 minutes. Pt tolerated well. No complaints of pain or SOB. Pt instructed to keep leg straight and has bedrest for four hours starting at 1900. Pt instructed to hold groin if needs to urinate, laugh, cough, or strain. Informed to call nurse with any chest or back pain, SOB, or notice of blood running down leg. Pulses good and strong in feet. Pt verbalizes understanding of all instructions.

## 2012-04-10 NOTE — ED Notes (Signed)
Report called to Morrie Sheldon RN in [facility] cath lab

## 2012-04-10 NOTE — Progress Notes (Signed)
Responded to [facility] STEMI.  On arrival of pt, offered pastoral presence to staff.  Per EMS, a family member was en route- I could not locate them at the time of this writing.  Please page if we can be of support to the family or pt.  [phone]

## 2012-04-11 DIAGNOSIS — I2510 Atherosclerotic heart disease of native coronary artery without angina pectoris: Secondary | ICD-10-CM

## 2012-04-11 DIAGNOSIS — I517 Cardiomegaly: Secondary | ICD-10-CM

## 2012-04-11 DIAGNOSIS — I10 Essential (primary) hypertension: Secondary | ICD-10-CM | POA: Diagnosis not present

## 2012-04-11 DIAGNOSIS — E876 Hypokalemia: Secondary | ICD-10-CM | POA: Diagnosis present

## 2012-04-11 DIAGNOSIS — E785 Hyperlipidemia, unspecified: Secondary | ICD-10-CM | POA: Diagnosis present

## 2012-04-11 DIAGNOSIS — I2109 ST elevation (STEMI) myocardial infarction involving other coronary artery of anterior wall: Secondary | ICD-10-CM | POA: Diagnosis not present

## 2012-04-11 DIAGNOSIS — I213 ST elevation (STEMI) myocardial infarction of unspecified site: Secondary | ICD-10-CM | POA: Diagnosis present

## 2012-04-11 LAB — CBC
HCT: 38.8 % — ABNORMAL LOW (ref 39.0–52.0)
Hemoglobin: 13.3 g/dL (ref 13.0–17.0)
MCH: 30.7 pg (ref 26.0–34.0)
MCHC: 34.3 g/dL (ref 30.0–36.0)
MCV: 89.6 fL (ref 78.0–100.0)
RBC: 4.33 MIL/uL (ref 4.22–5.81)

## 2012-04-11 LAB — URINE MICROSCOPIC-ADD ON

## 2012-04-11 LAB — CARDIAC PANEL(CRET KIN+CKTOT+MB+TROPI)
CK, MB: 170.2 ng/mL (ref 0.3–4.0)
Relative Index: 8.7 — ABNORMAL HIGH (ref 0.0–2.5)
Total CK: 1957 U/L — ABNORMAL HIGH (ref 7–232)
Total CK: 1227 U/L — ABNORMAL HIGH (ref 7–232)
Troponin I: >20 ng/mL (ref <0.30)

## 2012-04-11 LAB — BASIC METABOLIC PANEL
BUN: 17 mg/dL (ref 6–23)
CO2: 26 mEq/L (ref 19–32)
Calcium: 8.9 mg/dL (ref 8.4–10.5)
Creatinine, Ser: 0.98 mg/dL (ref 0.50–1.35)
Glucose, Bld: 125 mg/dL — ABNORMAL HIGH (ref 70–99)

## 2012-04-11 LAB — LIPID PANEL
Cholesterol: 158 mg/dL (ref 0–200)
HDL: 38 mg/dL — ABNORMAL LOW (ref >39)
Total CHOL/HDL Ratio: 4.2 RATIO
VLDL: 19 mg/dL (ref 0–40)

## 2012-04-11 LAB — URINALYSIS, ROUTINE W REFLEX MICROSCOPIC
Bilirubin Urine: NEGATIVE
Glucose, UA: NEGATIVE mg/dL
Ketones, ur: 15 mg/dL — AB
Protein, ur: 30 mg/dL — AB

## 2012-04-11 MED ORDER — LISINOPRIL 5 MG PO TABS
5.0000 mg | ORAL_TABLET | Freq: Every day | ORAL | Status: DC
  Administered 2012-04-11 – 2012-04-13 (×3): 5 mg via ORAL
  Filled 2012-04-11 (×3): qty 1

## 2012-04-11 MED ORDER — ROCURONIUM BROMIDE 50 MG/5ML IV SOLN
INTRAVENOUS | Status: AC
  Filled 2012-04-11: qty 2

## 2012-04-11 MED ORDER — ETOMIDATE 2 MG/ML IV SOLN
INTRAVENOUS | Status: AC
  Filled 2012-04-11: qty 20

## 2012-04-11 MED ORDER — SUCCINYLCHOLINE CHLORIDE 20 MG/ML IJ SOLN
INTRAMUSCULAR | Status: AC
  Filled 2012-04-11: qty 10

## 2012-04-11 MED ORDER — LIDOCAINE HCL (CARDIAC) 20 MG/ML IV SOLN
INTRAVENOUS | Status: AC
  Filled 2012-04-11: qty 5

## 2012-04-11 NOTE — Progress Notes (Signed)
Echocardiogram 2D Echocardiogram has been performed.  Bradley Hurley 04/11/2012, 10:16 AM

## 2012-04-11 NOTE — Progress Notes (Signed)
Utilization Review Completed.  Madden Garron T  04/11/2012  

## 2012-04-11 NOTE — Progress Notes (Signed)
1410-1450 Pt had walked 350 ft with RN earlier and now resting. Feeling a little tired now from fever. Stated he had no CP during walk. Pt stated he was ready for education though. Pt voiced understanding of ed. Completed MI ed. Permission given to refer to [place] Phase 2. Will followup tomorrow.

## 2012-04-11 NOTE — Progress Notes (Signed)
[facility] Notified of pt's increasing temperature. Order for UA received. Pt resting comfortably in chair. Denies pain or sob. Please see vitals flow sheet.

## 2012-04-11 NOTE — Progress Notes (Addendum)
SUBJECTIVE: No chest pain or SOB this am. No events.   Tele: NSR  BP 122/61  Pulse 80  Temp 99.3 F (37.4 C) (Oral)  Resp 17  Ht 6' (1.829 m)  Wt 205 lb 7.5 oz (93.2 kg)  BMI 27.87 kg/m2  SpO2 98%  Intake/Output Summary (Last 24 hours) at 04/11/12 1610 Last data filed at 04/11/12 0600  Gross per 24 hour  Intake 1048.6 ml  Output    125 ml  Net  923.6 ml    PHYSICAL EXAM General: Well developed, well nourished, in no acute distress. Alert and oriented x 3.  Psych:  Good affect, responds appropriately Neck: No JVD. No masses noted.  Lungs: Clear bilaterally with no wheezes or rhonci noted.  Heart: RRR with no murmurs noted. Abdomen: Bowel sounds are present. Soft, non-tender.  Extremities: No lower extremity edema. Right groin cath site ok.   LABS: Basic Metabolic Panel:  Basename 04/10/12 1436  NA 138  K 3.2*  CL 97  CO2 28  GLUCOSE 134*  BUN 15  CREATININE 1.23  CALCIUM 10.4  MG --  PHOS --   CBC:  Basename 04/10/12 1840 04/10/12 1436  WBC 20.3* 17.9*  NEUTROABS -- --  HGB 14.6 16.0  HCT 40.7 44.9  MCV 88.1 88.6  PLT 315 323   Cardiac Enzymes:  Basename 04/11/12 0045 04/10/12 1840  CKTOTAL 1957* 1401*  CKMB 170.2* 129.1*  CKMBINDEX -- --  TROPONINI >20.00* >20.00*   Fasting Lipid Panel: Pending.  Current Meds:    . atorvastatin  80 mg Oral q1800  . bivalirudin      . fentaNYL      . heparin      . heparin      . heparin  4,000 Units Intravenous Once  . hydrALAZINE      . lidocaine      . metoprolol      . metoprolol  5 mg Intravenous Once  . metoprolol tartrate  25 mg Oral BID  . midazolam      . morphine      . morphine      . morphine      . nitroGLYCERIN      . nitroGLYCERIN  5 mcg/min Intravenous Once  . nitroGLYCERIN      . ondansetron      . potassium chloride  60 mEq Oral Once  . prasugrel      . prasugrel  10 mg Oral Daily  . DISCONTD: bivalirudin (ANGIOMAX) infusion 5 mg/mL (Cath Lab,ACS,PCI indication)  0.25  mg/kg/hr Intravenous To Cath   EKG: residual ST elevation precordial leads with diffuse T wave inversions, c/w evolving MI. Sinus  ASSESSMENT AND PLAN:  1. Acute anterior STEMI: Pt presented to [facility] ED yesterday afternoon with chest pain. Initial EKG with anterior ST elevation c/w acute MI. Emergent cath with 100% occlusion of LAD in the mid segment just proximal to an old drug eluting stent (2003). Now s/p thrombectomy and PTCA/DES x 2 in the proximal and mid LAD. The distal vessel had spasm with some thrombus so he was managed overnight with Integrilin drip. He has been started on Effient, statin, beta blocker. No ASA secondary to ASA allergy ("throat closes"). NTG gtt overnight. Troponin greater than 20 representing large infarction.     -Will stop Integrilin drip this am    - D/C NTG gtt    -Continue Effient/statin/beta blocker. Will add Ace-inh today.     -Echo  today  2. CAD: See above. S/p cath with 2 DES.   3.  HTN :  BP much better this am.   4. HLD: Continue statin.    5. Hypokalemia: BMET pending this am.   6. Dispo: Transfer to step down today. Ambulate this am. Cardiac rehab.     Bradley Hurley  8/16/20136:43 AM

## 2012-04-12 DIAGNOSIS — I2510 Atherosclerotic heart disease of native coronary artery without angina pectoris: Secondary | ICD-10-CM | POA: Diagnosis not present

## 2012-04-12 DIAGNOSIS — I219 Acute myocardial infarction, unspecified: Secondary | ICD-10-CM

## 2012-04-12 DIAGNOSIS — E876 Hypokalemia: Secondary | ICD-10-CM | POA: Diagnosis not present

## 2012-04-12 LAB — URINALYSIS, ROUTINE W REFLEX MICROSCOPIC
Leukocytes, UA: NEGATIVE
Specific Gravity, Urine: 1.012 (ref 1.005–1.030)
Urobilinogen, UA: 0.2 mg/dL (ref 0.0–1.0)

## 2012-04-12 LAB — URINE MICROSCOPIC-ADD ON

## 2012-04-12 LAB — BASIC METABOLIC PANEL
BUN: 19 mg/dL (ref 6–23)
CO2: 28 mEq/L (ref 19–32)
Calcium: 8.8 mg/dL (ref 8.4–10.5)
Creatinine, Ser: 1 mg/dL (ref 0.50–1.35)
Glucose, Bld: 103 mg/dL — ABNORMAL HIGH (ref 70–99)

## 2012-04-12 LAB — CBC
MCH: 30.5 pg (ref 26.0–34.0)
MCHC: 33.9 g/dL (ref 30.0–36.0)
MCV: 89.8 fL (ref 78.0–100.0)
Platelets: 248 10*3/uL (ref 150–400)
RBC: 4.4 MIL/uL (ref 4.22–5.81)
RDW: 14.4 % (ref 11.5–15.5)

## 2012-04-12 MED ORDER — POTASSIUM CHLORIDE CRYS ER 20 MEQ PO TBCR
40.0000 meq | EXTENDED_RELEASE_TABLET | Freq: Once | ORAL | Status: AC
  Administered 2012-04-12: 40 meq via ORAL
  Filled 2012-04-12: qty 2

## 2012-04-12 MED ORDER — ALUM & MAG HYDROXIDE-SIMETH 200-200-20 MG/5ML PO SUSP
30.0000 mL | ORAL | Status: DC | PRN
  Administered 2012-04-12: 30 mL via ORAL
  Filled 2012-04-12: qty 30

## 2012-04-12 NOTE — Progress Notes (Signed)
RT placed patient on CPAP 11cm H2o, medium NASAL mask. NO O2 bled in. Patient verbalized home settings to RT. Sterile water placed in resevoir. HR 76, RR 18, Sats: 99%. Patient tolerating well. RT will continue to monitor.

## 2012-04-12 NOTE — Progress Notes (Signed)
CARDIAC REHAB PHASE I   PRE:  Rate/Rhythm: SR 76  BP:  Supine:   Sitting: 150/80  Standing:    SaO2: RA  MODE:  Ambulation: 650 ft   POST:  Rate/Rhythm: SR 87  BP:  Supine:   Sitting: 148/78  Standing:    SaO2: RA Ambulation completed with no c/o cp or sob.  Pt c/o mild bilat leg discomfort he noted with ambulation.  Pt remarked that his temperature was elevated last night. During ambulation pt with occasional productive cough.  Pt anticipates going home tomorrow.  Pt back to bed with call bell in place. [id number]  Arna Medici

## 2012-04-12 NOTE — Progress Notes (Signed)
    SUBJECTIVE:  66y/o admitted 8/15 with large anterior stemi. Now s/p thrombectomy and PTCA/DES x 2 in the proximal and mid LAD. The distal vessel had spasm with some thrombus so he was managed overnight with Integrilin drip. Has ASA allergy on Effient now.   ECHO: The estimated ejection fraction was in the range of 50% to 55%. Regional wall motion abnormalities: There is akinesis of the mid-distalanteroseptal, inferoseptal, and apical myocardium.   He feels good. Mild indigestion. But no CP.   Tele: NSR  BP 99/49  Pulse 76  Temp 98.5 F (36.9 C) (Oral)  Resp 18  Ht 6' (1.829 m)  Wt 93.2 kg (205 lb 7.5 oz)  BMI 27.87 kg/m2  SpO2 95%  Intake/Output Summary (Last 24 hours) at 04/12/12 0916 Last data filed at 04/11/12 2200  Gross per 24 hour  Intake    460 ml  Output      0 ml  Net    460 ml    PHYSICAL EXAM General: Well developed, well nourished, in no acute distress. Alert and oriented x 3.  Psych:  Good affect, responds appropriately Neck: No JVD. No masses noted.  Lungs: Clear bilaterally with no wheezes or rhonci noted.  Heart: RRR with no murmurs noted. Abdomen: Obese Bowel sounds are present. Soft, non-tender.  Extremities: No lower extremity edema.   LABS: Basic Metabolic Panel:  Basename 04/12/12 0630 04/11/12 0635  NA 137 137  K 3.6 3.7  CL 100 100  CO2 28 26  GLUCOSE 103* 125*  BUN 19 17  CREATININE 1.00 0.98  CALCIUM 8.8 8.9  MG -- --  PHOS -- --   CBC:  Basename 04/12/12 0630 04/11/12 0635  WBC 10.2 15.7*  NEUTROABS -- --  HGB 13.4 13.3  HCT 39.5 38.8*  MCV 89.8 89.6  PLT 248 263   Cardiac Enzymes:  Basename 04/11/12 0635 04/11/12 0045 04/10/12 1840  CKTOTAL 1227* 1957* 1401*  CKMB 73.7* 170.2* 129.1*  CKMBINDEX -- -- --  TROPONINI >20.00* >20.00* >20.00*   Fasting Lipid Panel: Pending.  Current Meds:    . atorvastatin  80 mg Oral q1800  . lisinopril  5 mg Oral Daily  . metoprolol  5 mg Intravenous Once  . metoprolol  tartrate  25 mg Oral BID  . potassium chloride  60 mEq Oral Once  . prasugrel  10 mg Oral Daily   EKG: residual ST elevation precordial leads with diffuse T wave inversions, c/w evolving MI. Sinus  ASSESSMENT AND PLAN:  1. Acute anterior STEMI:  2. CAD: See above. S/p cath with 2 DES.   3.  HTN :  BP soft but stable  4. HLD: Continue high-dose statin.    5. Hypokalemia: mildly low. Will supp.   6. Dispo: Transfer to tele today. Probably home tomorrow  Discussion: Overall much improved. EF low normal by echo despite large troponin elevation. BP too low to titrate meds toomuch. To tele today. Continue CR. Probably home in am.   Arvilla Meres  8/17/20139:16 AM

## 2012-04-13 DIAGNOSIS — I219 Acute myocardial infarction, unspecified: Secondary | ICD-10-CM | POA: Diagnosis not present

## 2012-04-13 MED ORDER — LISINOPRIL 5 MG PO TABS: 5.0000 mg | ORAL_TABLET | Freq: Every day | ORAL | Status: DC

## 2012-04-13 MED ORDER — METOPROLOL TARTRATE 25 MG PO TABS: 25.0000 mg | ORAL_TABLET | Freq: Two times a day (BID) | ORAL | Status: DC

## 2012-04-13 MED ORDER — PRASUGREL HCL 10 MG PO TABS: 10.0000 mg | ORAL_TABLET | Freq: Every day | ORAL | Status: DC

## 2012-04-13 NOTE — Discharge Summary (Signed)
Physician Discharge Summary  Patient ID: Bradley Hurley MRN: [id number] DOB/AGE: 04/02/1946 66 y.o.  Admit date: 04/10/2012 Discharge date: 04/13/2012  Primary Discharge Diagnosis:  1. Anterior STEMI  2. S/P thrombectomy of the mid LAD, PTCA/DES x2 proximal and mid LAD  Secondary Discharge Diagnosis: 1. CAD: Status post stenting to the LAD in 2003 2. Hypertension 3. Hyperlipidemia 4. OSA  Significant Diagnostic Studies: Cardiac catheterization completed by Dr. Verne Carrow 04/10/2012  Angiographic Findings:  Left main: Distal 10% plaque.  Left Anterior Descending Artery: Large caliber vessel that courses the apex. The proximal vessel has diffuse 50% stenosis. The mid vessel has 100% total occlusion just prior to the old stent. Two stents are visible in the mid vessel. There is a 5-8 mm area of vessel that is not stented between the stents.  Circumflex Artery: Large caliber vessel that terminates into a marginal branch. There is a 40% stenosis in the mid portion of the marginal branch.  Right Coronary Artery: Large dominant vessel with luminal irregularities in the proximal and mid segment. The distal vessel has serial 30% stenoses. The PDA is a moderate caliber vessel with mild diffuse plaque. The PLA is a moderate sized vessel with mild diffuse plaque.  Left Ventricular Angiogram: LVEF=40%. Hypokinesis of the anterior wall and apex.  Impression:  1. Acute anterior STEMI  2. Occluded mid LAD, now s/p successful thrombectomy and PTCA with placement of   2 DES in the proximal and mid LAD.  3. Segment LV systolic dysfunction    Consults: None  Hospital Course: [name] is a 66 year old patient of Dr. Kawela Bay Bing and Dr. Fuller Plan, with known history of coronary artery disease. He had not yet been est. in our practice but did have a poor with Dr. Dietrich Pates the day of admission, formerly followed by [facility]. He presented to [facility] with substernal chest pain  associated with diaphoresis. He was transferred emergently to [facility] hospital where he was sent to catheterization lab. At cath lab the patient was found to have a totally occluded LAD in the mid segment at the proximal edge of the old stent. A successful thrombectomy and PTCA with placement of 2 drug-eluting stents in the proximal and mid LAD was completed. He tolerated the procedure well and recovered without incident. He was started on ,Effient, lisinopril 5 mg daily atorvastatin 80 mg daily and aspirin. Echocardiogram was completed with findings:  Systolic function was normal. The estimated ejection fraction was in the range of 50% to 55%. There is akinesis of the mid-distalanteroseptal, inferoseptal, and apical myocardium. Doppler parameters are consistent with abnormal left ventricular relaxation (grade1 diastolic dysfunction). He was seen and examined by Dr. Eden Emms and found to be stable for discharge. He will followup in [place] office with Joni Reining, NP and will need a repeat echocardiogram in 4-6 weeks.   Discharge Exam: Blood pressure 128/82, pulse 81, temperature 99.7 F (37.6 C), temperature source Oral, resp. rate 19, height 6' (1.829 m), weight 205 lb 7.5 oz (93.2 kg), SpO2 96.00%.    Labs:   Lab Results  Component Value Date   WBC 10.2 04/12/2012   HGB 13.4 04/12/2012   HCT 39.5 04/12/2012   MCV 89.8 04/12/2012   PLT 248 04/12/2012    Lab 04/12/12 0630 04/10/12 1436  NA 137 --  K 3.6 --  CL 100 --  CO2 28 --  BUN 19 --  CREATININE 1.00 --  CALCIUM 8.8 --  PROT -- 8.6*  BILITOT -- 0.5  ALKPHOS -- 48  ALT -- 24  AST -- 39*  GLUCOSE 103* --   Lab Results  Component Value Date   CKTOTAL 1227* 04/11/2012   CKMB 73.7* 04/11/2012   TROPONINI >20.00* 04/11/2012    Lab Results  Component Value Date   CHOL 158 04/11/2012   Lab Results  Component Value Date   HDL 38* 04/11/2012   Lab Results  Component Value Date   LDLCALC 101* 04/11/2012   Lab Results  Component  Value Date   TRIG 96 04/11/2012   Lab Results  Component Value Date   CHOLHDL 4.2 04/11/2012   No results found for this basename: LDLDIRECT      Radiology: Dg Chest Portable 1 View  04/10/2012  *RADIOLOGY REPORT*  Clinical Data: Chest pain and hypertension  PORTABLE CHEST - 1 VIEW  Comparison: None of the  Findings: Heart size appears upper normal to mildly prominent for portable technique.  Coronary artery stents are noted.  The pulmonary vascularity is within normal limits.  The lung volumes are low bilaterally.  No focal airspace disease, effusion, or edema is identified.  No evidence of pneumothorax.  Visualized bony thorax is unremarkable.  IMPRESSION: 1. Low lung volumes.  No acute findings identified. 2.  Borderline/mild cardiomegaly with coronary artery stents visible.  Original Report Authenticated By: Britta Mccreedy, M.D.    EKG: Normal sinus rhythm with anterior septal infarct.  FOLLOW UP PLANS AND APPOINTMENTS Discharge Orders    Future Orders Please Complete By Expires   Amb Referral to Cardiac Rehabilitation        Medication List  As of 04/13/2012 10:06 AM   TAKE these medications         fluticasone 50 MCG/ACT nasal spray   Commonly known as: FLONASE   Place 2 sprays into the nose daily.      hydrochlorothiazide 25 MG tablet   Commonly known as: HYDRODIURIL   Take 25 mg by mouth daily.      lisinopril 5 MG tablet   Commonly known as: PRINIVIL,ZESTRIL   Take 1 tablet (5 mg total) by mouth daily.      metoprolol tartrate 25 MG tablet   Commonly known as: LOPRESSOR   Take 1 tablet (25 mg total) by mouth 2 (two) times daily.      prasugrel 10 MG Tabs   Commonly known as: EFFIENT   Take 1 tablet (10 mg total) by mouth daily.      pravastatin 40 MG tablet   Commonly known as: PRAVACHOL   Take 40 mg by mouth daily.           Follow-up Information    Follow up with Joni Reining, NP. (Our office will call you for appointment in [place])    Contact  information:   [address] [place] [address] [phone]            Time spent with patient to include physician time:40 minutes Signed: Joni Reining 04/13/2012, 10:06 AM Co-Sign MD

## 2012-04-13 NOTE — Progress Notes (Signed)
Patient ID: Bradley Hurley, male   DOB: 29-Jul-1946, 66 y.o.   MRN: [id number]    SUBJECTIVE:  66y/o admitted 8/15 with large anterior stemi. Now s/p thrombectomy and PTCA/DES x 2 in the proximal and mid LAD. The distal vessel had spasm with some thrombus so he was managed overnight with Integrilin drip. Has ASA allergy on Effient now.   ECHO: The estimated ejection fraction was in the range of 50% to 55%. Regional wall motion abnormalities: There is akinesis of the mid-distalanteroseptal, inferoseptal, and apical myocardium.   No pain or dyspnea  Tele: NSR no VT or arrhythmia 04/13/2012   BP 120/71  Pulse 75  Temp 99.8 F (37.7 C) (Oral)  Resp 19  Ht 6' (1.829 m)  Wt 205 lb 7.5 oz (93.2 kg)  BMI 27.87 kg/m2  SpO2 96% No intake or output data in the 24 hours ending 04/13/12 0854  PHYSICAL EXAM General: Obese white male  Psych:  Good affect, responds appropriately Neck: No JVD. No masses noted.  Lungs: Clear bilaterally with no wheezes or rhonci noted.  Heart: RRR with no murmurs noted. Abdomen: Obese Bowel sounds are present. Soft, non-tender.  Extremities: No lower extremity edema.   LABS: Basic Metabolic Panel:  Basename 04/12/12 0630 04/11/12 0635  NA 137 137  K 3.6 3.7  CL 100 100  CO2 28 26  GLUCOSE 103* 125*  BUN 19 17  CREATININE 1.00 0.98  CALCIUM 8.8 8.9  MG -- --  PHOS -- --   CBC:  Basename 04/12/12 0630 04/11/12 0635  WBC 10.2 15.7*  NEUTROABS -- --  HGB 13.4 13.3  HCT 39.5 38.8*  MCV 89.8 89.6  PLT 248 263   Cardiac Enzymes:  Basename 04/11/12 0635 04/11/12 0045 04/10/12 1840  CKTOTAL 1227* 1957* 1401*  CKMB 73.7* 170.2* 129.1*  CKMBINDEX -- -- --  TROPONINI >20.00* >20.00* >20.00*   Fasting Lipid Panel: Pending.  Current Meds:    . atorvastatin  80 mg Oral q1800  . lisinopril  5 mg Oral Daily  . metoprolol tartrate  25 mg Oral BID  . potassium chloride  40 mEq Oral Once  . potassium chloride  60 mEq Oral Once  . prasugrel  10  mg Oral Daily  . DISCONTD: metoprolol  5 mg Intravenous Once   EKG: residual ST elevation precordial leads with diffuse T wave inversions, c/w evolving MI. Sinus  ASSESSMENT AND PLAN:  1. Acute anterior STEMI:  2. CAD: See above. S/p cath with 2 DES.   3.  HTN :  BP soft but stable  4. HLD: Continue high-dose statin.    5. Hypokalemia: mildly low. Will supp.   6. Dispo: Transfer to tele today. Probably home tomorrow  Ok to D/C home F/U in [place] with PA K. Lawrence and Dr Dietrich Pates.  Would repeat echo n 4-6 weeks to make sure there is no  remodelling and worsening of EF  Needs cardiac rehab at [facility]  8/18/20138:54 AM

## 2012-04-14 NOTE — Progress Notes (Signed)
This encounter was created in error - please disregard.

## 2012-05-20 ENCOUNTER — Encounter (HOSPITAL_COMMUNITY)
Admission: RE | Admit: 2012-05-20 | Discharge: 2012-05-20 | Disposition: A | Discharge status: Home / Self Care | Payer: Federal, State, Local not specified - PPO | Source: Ambulatory Visit | Attending: Internal Medicine | Admitting: Internal Medicine

## 2012-05-20 ENCOUNTER — Encounter (HOSPITAL_COMMUNITY): Payer: Self-pay

## 2012-05-20 DIAGNOSIS — I10 Essential (primary) hypertension: Secondary | ICD-10-CM | POA: Insufficient documentation

## 2012-05-20 DIAGNOSIS — I2109 ST elevation (STEMI) myocardial infarction involving other coronary artery of anterior wall: Secondary | ICD-10-CM | POA: Insufficient documentation

## 2012-05-20 DIAGNOSIS — I2510 Atherosclerotic heart disease of native coronary artery without angina pectoris: Secondary | ICD-10-CM | POA: Insufficient documentation

## 2012-05-20 DIAGNOSIS — Z5189 Encounter for other specified aftercare: Secondary | ICD-10-CM | POA: Insufficient documentation

## 2012-05-20 NOTE — Progress Notes (Signed)
Patient referred by Dr. Clifton James due to STEMI 410.90 and Stent Placement V45.82. During orientation advised patient on arrival and appointment times what to wear, what to do before, during and after exercise. Reviewed attendance and class policy. Talked about inclement weather and class consultation policy. Pt is scheduled to start Cardiac Rehab on 05/26/12 at  11 am. Pt was advised to come to class 5 minutes before class starts. He was also given instructions on meeting with the dietician and attending the Family Structure classes. Pt is eager to get started.

## 2012-05-20 NOTE — Patient Instructions (Signed)
  Pt has finished orientation and is scheduled to start CR on 05/26/12 at 11 am. Pt has been instructed to arrive to class 15 minutes early for scheduled class. Pt has been instructed to wear comfortable clothing and shoes with rubber soles. Pt has been told to take their medications 1 hour prior to coming to class.  If the patient is not going to attend class, he/she has been instructed to call.

## 2012-05-21 ENCOUNTER — Telehealth: Payer: Self-pay

## 2012-05-21 NOTE — Telephone Encounter (Signed)
Called. Many rings and no answer.  

## 2012-05-21 NOTE — Telephone Encounter (Signed)
Pt called back and said that he is going to have to wait because he had a heart attack and is still under the care of the Cardiology. He said that he will call us back when they release him.

## 2012-05-22 NOTE — Telephone Encounter (Signed)
Letter sent to Dr. Sudie Bailey.

## 2012-05-26 ENCOUNTER — Encounter (HOSPITAL_COMMUNITY)
Admission: RE | Admit: 2012-05-26 | Discharge: 2012-05-26 | Disposition: A | Discharge status: Home / Self Care | Payer: Federal, State, Local not specified - PPO | Source: Ambulatory Visit | Attending: Internal Medicine | Admitting: Internal Medicine

## 2012-05-26 DIAGNOSIS — I2109 ST elevation (STEMI) myocardial infarction involving other coronary artery of anterior wall: Secondary | ICD-10-CM | POA: Diagnosis not present

## 2012-05-26 DIAGNOSIS — I10 Essential (primary) hypertension: Secondary | ICD-10-CM | POA: Diagnosis not present

## 2012-05-26 DIAGNOSIS — I2510 Atherosclerotic heart disease of native coronary artery without angina pectoris: Secondary | ICD-10-CM | POA: Diagnosis not present

## 2012-05-26 DIAGNOSIS — Z5189 Encounter for other specified aftercare: Secondary | ICD-10-CM | POA: Diagnosis not present

## 2012-05-28 ENCOUNTER — Encounter (HOSPITAL_COMMUNITY)
Admission: RE | Admit: 2012-05-28 | Discharge: 2012-05-28 | Disposition: A | Discharge status: Home / Self Care | Payer: Federal, State, Local not specified - PPO | Source: Ambulatory Visit | Attending: Internal Medicine | Admitting: Internal Medicine

## 2012-05-28 DIAGNOSIS — I10 Essential (primary) hypertension: Secondary | ICD-10-CM | POA: Insufficient documentation

## 2012-05-28 DIAGNOSIS — Z5189 Encounter for other specified aftercare: Secondary | ICD-10-CM | POA: Diagnosis not present

## 2012-05-28 DIAGNOSIS — I2109 ST elevation (STEMI) myocardial infarction involving other coronary artery of anterior wall: Secondary | ICD-10-CM | POA: Diagnosis not present

## 2012-05-28 DIAGNOSIS — I2510 Atherosclerotic heart disease of native coronary artery without angina pectoris: Secondary | ICD-10-CM | POA: Insufficient documentation

## 2012-05-30 ENCOUNTER — Encounter (HOSPITAL_COMMUNITY)
Admission: RE | Admit: 2012-05-30 | Discharge: 2012-05-30 | Disposition: A | Discharge status: Home / Self Care | Payer: Federal, State, Local not specified - PPO | Source: Ambulatory Visit | Attending: Internal Medicine | Admitting: Internal Medicine

## 2012-06-02 ENCOUNTER — Encounter: Payer: Self-pay | Admitting: Internal Medicine

## 2012-06-02 ENCOUNTER — Encounter (HOSPITAL_COMMUNITY): Payer: Federal, State, Local not specified - PPO

## 2012-06-02 ENCOUNTER — Ambulatory Visit (INDEPENDENT_AMBULATORY_CARE_PROVIDER_SITE_OTHER): Payer: Federal, State, Local not specified - PPO | Admitting: Internal Medicine

## 2012-06-02 ENCOUNTER — Encounter: Payer: Self-pay | Admitting: *Deleted

## 2012-06-02 VITALS — BP 134/74 | HR 50 | Ht 65.0 in | Wt 197.4 lb

## 2012-06-02 DIAGNOSIS — I10 Essential (primary) hypertension: Secondary | ICD-10-CM

## 2012-06-02 DIAGNOSIS — E782 Mixed hyperlipidemia: Secondary | ICD-10-CM

## 2012-06-02 MED ORDER — LISINOPRIL 10 MG PO TABS: 10.0000 mg | ORAL_TABLET | Freq: Every day | ORAL | Status: DC

## 2012-06-02 MED ORDER — HYDROCHLOROTHIAZIDE 25 MG PO TABS: 12.5000 mg | ORAL_TABLET | Freq: Every day | ORAL | Status: DC

## 2012-06-02 NOTE — Progress Notes (Signed)
HPI Patient is a 66 yr old who presents for post hospital visit. He was admitted to [facility] and sent to cath lab for STEMI.    Cath showed:  100% mid LAD prior to old stent.  LCx with 40% mid; RCA with 30% stenoses LVEF was 40%  He underwent DESx2 to hte prox and mid LAD Echo prior to d/c was normal with akinesis of the mid/distal anteroseptal, inferoseptal and apical walls.  Since seen no CP  Breathing is OK  Endurance is building at rehab.   """" Allergies  Allergen Reactions  . Aspirin     Throat closes    Current Outpatient Prescriptions  Medication Sig Dispense Refill  . fluticasone (FLONASE) 50 MCG/ACT nasal spray Place 2 sprays into the nose daily.      . hydrochlorothiazide (HYDRODIURIL) 25 MG tablet Take 25 mg by mouth daily.      Marland Kitchen lisinopril (PRINIVIL,ZESTRIL) 5 MG tablet Take 1 tablet (5 mg total) by mouth daily.  30 tablet  6  . metoprolol tartrate (LOPRESSOR) 25 MG tablet Take 1 tablet (25 mg total) by mouth 2 (two) times daily.  60 tablet  10  . prasugrel (EFFIENT) 10 MG TABS Take 1 tablet (10 mg total) by mouth daily.  30 tablet  11  . pravastatin (PRAVACHOL) 40 MG tablet Take 40 mg by mouth daily.        Past Medical History  Diagnosis Date  . 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 Hypertension     Lab 03/2012: Normal CMet, TSH, CBC, PSA  . Hyperlipidemia     Lipid profile in 03/2012:182, 161,09,604  . Obstructive sleep apnea 05/2011    [facility]-05/2011  . Tinnitus   . Erectile dysfunction     Low serum testosterone level  . Depression   . Obesity   . Deviated septum   . 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 Splenectomy   . Appendectomy   . Coronary stent placement 04/11/12    Family History  Problem Relation  Age of Onset  . Arrhythmia Father   . Cancer Mother     History   Social History  . Marital Status: Married    Spouse Name: N/A    Number of Children: N/A  . Years of Education: N/A   Occupational History  . 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 Smoking status: Former Smoker -- 1.0 packs/day for 15 years  . Smokeless tobacco: Former Neurosurgeon    Quit date: 01/30/1981   Comment: Quit in 1982  . Alcohol Use: 0.5 oz/week    1 drink(s) per week     Rare consumption - once/twice per week  . Drug Use: No  . Sexually Active: Yes   Other Topics Concern  . 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    Review of Systems:  All systems reviewed.  They are negative to the above problem except as previously stated.  Vital Signs: BP 134/74  Pulse 50  Ht 5\' 5"  (1.651 m)  Wt 197 lb 6.4 oz (89.54 kg)  BMI 32.85 kg/m2  SpO2 96%  Physical Exam Patient  is in NAD HEENT:  Normocephalic, atraumatic. EOMI, PERRLA.  Neck: JVP is normal.  No bruits.  Lungs: clear to auscultation. No rales no wheezes.  Heart: Regular rate and rhythm. Normal S1, S2. No S3.   No significant murmurs. PMI not displaced.  Abdomen:  Supple, nontender. Normal bowel sounds. No masses. No hepatomegaly.  Extremities:   Good distal pulses throughout. No lower extremity edema.  Musculoskeletal :moving all extremities.  Neuro:   alert and oriented x3.  CN II-XII grossly intact.   Assessment and Plan: 1.  CAD.  Patient doing well s/p recent intervention.  Will need to be on Effient long term with ASA allergy Would repeat echo at end of Oct to reassess LVEF and wall motion. Continue meds.  Check labs.  2.  HTN  Patient notes frequent urination.  Would recomm decreasing HCTZ to 12.5 mg per day.  Increase lisinoprl to 10 qd.  Labs in 2 wks  3.  HL  WIll need fasting lipids in a couple wks.  Continue statin.  Continue cardiac rehab.  F/U in 3  months.

## 2012-06-02 NOTE — Patient Instructions (Addendum)
Your physician recommends that you schedule a follow-up appointment in: January  Your physician recommends that you return for lab work in: 2 weeks  Your physician has recommended you make the following change in your medication:  1 - INCREASE Lisinopril to 10 mg daily 2 - DECREASE HCTZ to 12.5 mg dialy  Your physician has requested that you have an echocardiogram. Echocardiography is a painless test that uses sound waves to create images of your heart. It provides your doctor with information about the size and shape of your heart and how well your heart's chambers and valves are working. This procedure takes approximately one hour. There are no restrictions for this procedure.

## 2012-06-04 ENCOUNTER — Encounter (HOSPITAL_COMMUNITY)
Admission: RE | Admit: 2012-06-04 | Discharge: 2012-06-04 | Disposition: A | Discharge status: Home / Self Care | Payer: Federal, State, Local not specified - PPO | Source: Ambulatory Visit | Attending: Internal Medicine | Admitting: Internal Medicine

## 2012-06-06 ENCOUNTER — Emergency Department (HOSPITAL_COMMUNITY)
Admission: EM | Admit: 2012-06-06 | Discharge: 2012-06-06 | Disposition: A | Discharge status: Home / Self Care | Payer: Federal, State, Local not specified - PPO | Attending: Emergency Medicine | Admitting: Emergency Medicine

## 2012-06-06 ENCOUNTER — Other Ambulatory Visit: Payer: Self-pay

## 2012-06-06 ENCOUNTER — Encounter: Payer: Self-pay | Admitting: *Deleted

## 2012-06-06 ENCOUNTER — Encounter (HOSPITAL_COMMUNITY): Payer: Self-pay | Admitting: *Deleted

## 2012-06-06 ENCOUNTER — Encounter (HOSPITAL_COMMUNITY)
Admission: RE | Admit: 2012-06-06 | Discharge: 2012-06-06 | Disposition: A | Discharge status: Home / Self Care | Payer: Federal, State, Local not specified - PPO | Source: Ambulatory Visit | Attending: Internal Medicine | Admitting: Internal Medicine

## 2012-06-06 DIAGNOSIS — Z884 Allergy status to anesthetic agent status: Secondary | ICD-10-CM | POA: Diagnosis not present

## 2012-06-06 DIAGNOSIS — F329 Major depressive disorder, single episode, unspecified: Secondary | ICD-10-CM | POA: Diagnosis not present

## 2012-06-06 DIAGNOSIS — F3289 Other specified depressive episodes: Secondary | ICD-10-CM | POA: Insufficient documentation

## 2012-06-06 DIAGNOSIS — E669 Obesity, unspecified: Secondary | ICD-10-CM | POA: Insufficient documentation

## 2012-06-06 DIAGNOSIS — Z9861 Coronary angioplasty status: Secondary | ICD-10-CM | POA: Insufficient documentation

## 2012-06-06 DIAGNOSIS — I252 Old myocardial infarction: Secondary | ICD-10-CM | POA: Insufficient documentation

## 2012-06-06 DIAGNOSIS — E785 Hyperlipidemia, unspecified: Secondary | ICD-10-CM | POA: Insufficient documentation

## 2012-06-06 DIAGNOSIS — Z87891 Personal history of nicotine dependence: Secondary | ICD-10-CM | POA: Insufficient documentation

## 2012-06-06 DIAGNOSIS — I2510 Atherosclerotic heart disease of native coronary artery without angina pectoris: Secondary | ICD-10-CM | POA: Diagnosis not present

## 2012-06-06 DIAGNOSIS — F438 Other reactions to severe stress: Secondary | ICD-10-CM | POA: Diagnosis not present

## 2012-06-06 DIAGNOSIS — G4733 Obstructive sleep apnea (adult) (pediatric): Secondary | ICD-10-CM | POA: Diagnosis not present

## 2012-06-06 DIAGNOSIS — N529 Male erectile dysfunction, unspecified: Secondary | ICD-10-CM | POA: Insufficient documentation

## 2012-06-06 DIAGNOSIS — I10 Essential (primary) hypertension: Secondary | ICD-10-CM | POA: Insufficient documentation

## 2012-06-06 DIAGNOSIS — F430 Acute stress reaction: Secondary | ICD-10-CM

## 2012-06-06 DIAGNOSIS — F4389 Other reactions to severe stress: Secondary | ICD-10-CM | POA: Insufficient documentation

## 2012-06-06 HISTORY — DX: Acute myocardial infarction, unspecified: I21.9

## 2012-06-06 LAB — URINALYSIS, ROUTINE W REFLEX MICROSCOPIC
Bilirubin Urine: NEGATIVE
Ketones, ur: NEGATIVE mg/dL
Leukocytes, UA: NEGATIVE
Nitrite: NEGATIVE
Protein, ur: NEGATIVE mg/dL

## 2012-06-06 LAB — CBC WITH DIFFERENTIAL/PLATELET
Basophils Absolute: 0.1 10*3/uL (ref 0.0–0.1)
Basophils Relative: 1 % (ref 0–1)
Eosinophils Absolute: 0.2 10*3/uL (ref 0.0–0.7)
Eosinophils Relative: 3 % (ref 0–5)
HCT: 37.4 % — ABNORMAL LOW (ref 39.0–52.0)
MCH: 31 pg (ref 26.0–34.0)
MCHC: 34.8 g/dL (ref 30.0–36.0)
MCV: 89 fL (ref 78.0–100.0)
Monocytes Absolute: 0.8 10*3/uL (ref 0.1–1.0)
Neutro Abs: 5.1 10*3/uL (ref 1.7–7.7)
RDW: 14.6 % (ref 11.5–15.5)

## 2012-06-06 LAB — BASIC METABOLIC PANEL
BUN: 27 mg/dL — ABNORMAL HIGH (ref 6–23)
Calcium: 10 mg/dL (ref 8.4–10.5)
Creatinine, Ser: 1.24 mg/dL (ref 0.50–1.35)
GFR calc Af Amer: 68 mL/min — ABNORMAL LOW (ref >90)
GFR calc non Af Amer: 59 mL/min — ABNORMAL LOW (ref >90)

## 2012-06-06 MED ORDER — LORAZEPAM 2 MG/ML IJ SOLN
1.0000 mg | Freq: Once | INTRAMUSCULAR | Status: AC
  Administered 2012-06-06: 1 mg via INTRAVENOUS
  Filled 2012-06-06: qty 1

## 2012-06-06 MED ORDER — LORAZEPAM 1 MG PO TABS: 0.5000 mg | ORAL_TABLET | Freq: Three times a day (TID) | ORAL | Status: DC | PRN

## 2012-06-06 MED ORDER — SODIUM CHLORIDE 0.9 % IV SOLN
Freq: Once | INTRAVENOUS | Status: AC
  Administered 2012-06-06: 18:00:00 via INTRAVENOUS

## 2012-06-06 NOTE — ED Notes (Signed)
Pt states that he could give [place] a sample at this time. Abhiram Criado

## 2012-06-06 NOTE — ED Provider Notes (Signed)
History     CSN: [id number]  Arrival date & time 06/06/12  1738   First MD Initiated Contact with Patient 06/06/12 1802      Chief Complaint  Patient presents with  . Chest Pain    (Consider location/radiation/quality/duration/timing/severity/associated sxs/prior treatment) Patient is a 66 y.o. male presenting with chest pain. The history is provided by the patient and medical records. No language interpreter was used.  Chest Pain The chest pain began 1 - 2 hours ago. Episode Length: Pt had had a STEMI in August 2013.  He was in a stressful experience an hour or so ago, and felt his heart was heavy and he had mild left chest pain, rated at a 1 or 2, and facial flushing.Marland Kitchen  He therefore sought evalutation. The chest pain is improving. The pain is associated with stress. At its most intense, the pain is at 2/10. The pain is currently at 2/10. The severity of the pain is mild. The quality of the pain is described as heavy. The pain does not radiate. Chest pain is worsened by stress. Pertinent negatives for primary symptoms include no fever. He tried nothing for the symptoms. Risk factors: Had STEMI in August 2013, about 6 weeks ago.  His past medical history is significant for CAD and MI.  Procedure history is positive for cardiac catheterization.     Past Medical History  Diagnosis Date  . 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 Hypertension     Lab 03/2012: Normal CMet, TSH, CBC, PSA  . Hyperlipidemia     Lipid profile in 03/2012:182, 409,81,191  . Obstructive sleep apnea 05/2011    [facility]-05/2011  . Tinnitus   . Erectile dysfunction     Low serum testosterone level  . Depression   . Obesity   . Deviated septum   . 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 MI (myocardial  infarction)     Past Surgical History  Procedure Date  . Splenectomy   . Appendectomy   . Coronary stent placement 04/11/12    Family History  Problem Relation Age of Onset  . Arrhythmia Father   . Cancer Mother     History  Substance Use Topics  . Smoking status: Former Smoker -- 1.0 packs/day for 15 years  . Smokeless tobacco: Former Neurosurgeon    Quit date: 01/30/1981   Comment: Quit in 1982  . Alcohol Use: 0.5 oz/week    1 drink(s) per week     Rare consumption - once/twice per week      Review of Systems  Constitutional: Negative for fever and chills.  HENT:       Facial flushing.  Eyes: Negative.   Respiratory: Negative.   Cardiovascular: Positive for chest pain.  Gastrointestinal: Negative.   Genitourinary: Negative.   Musculoskeletal: Negative.   Neurological: Negative.   Psychiatric/Behavioral: The patient is nervous/anxious.     Allergies  Aspirin  Home Medications   Current Outpatient Rx  Name Route Sig Dispense Refill  . FLUTICASONE PROPIONATE 50 MCG/ACT NA SUSP Nasal Place 2 sprays into the nose daily.    Marland Kitchen HYDROCHLOROTHIAZIDE 25 MG PO TABS Oral Take 0.5 tablets (12.5 mg total) by mouth daily. 30 tablet 11  . LISINOPRIL 10 MG PO TABS Oral Take 1 tablet (10 mg  total) by mouth daily. 30 tablet 11  . METOPROLOL TARTRATE 25 MG PO TABS Oral Take 1 tablet (25 mg total) by mouth 2 (two) times daily. 60 tablet 10  . PRASUGREL HCL 10 MG PO TABS Oral Take 1 tablet (10 mg total) by mouth daily. 30 tablet 11  . PRAVASTATIN SODIUM 40 MG PO TABS Oral Take 40 mg by mouth daily.      BP 164/78  Pulse 61  Temp 98.7 F (37.1 C) (Oral)  Ht 5\' 4"  (1.626 m)  Wt 197 lb (89.359 kg)  BMI 33.82 kg/m2  SpO2 98%  Physical Exam  Nursing note and vitals reviewed. Constitutional: He appears well-developed and well-nourished.       Appears stressed.  HENT:  Head: Normocephalic and atraumatic.  Right Ear: External ear normal.  Left Ear: External ear normal.    Mouth/Throat: Oropharynx is clear and moist.  Eyes: Conjunctivae normal and EOM are normal. Pupils are equal, round, and reactive to light.  Neck: Normal range of motion. Neck supple.  Cardiovascular: Normal rate, regular rhythm and normal heart sounds.   Pulmonary/Chest: Effort normal and breath sounds normal. No respiratory distress.  Abdominal: Soft. Bowel sounds are normal.    ED Course  Procedures (including critical care time)  6:03 PM  Date: 06/06/2012  Rate: 69  Rhythm: normal sinus rhythm  QRS Axis: normal  Intervals: normal  ST/T Wave abnormalities: nonspecific T wave changes  Conduction Disutrbances:none  Narrative Interpretation: Abnormal EKG  Old EKG Reviewed: none available   6:19 PM Pt was seen --> physical exam performed.  EKG benign.  Lab workup ordered.  IV Ativan ordered for anxiety.  7:40 PM Results for orders placed during the hospital encounter of 06/06/12  CBC WITH DIFFERENTIAL      Component Value Range   WBC 8.3  4.0 - 10.5 K/uL   RBC 4.20 (*) 4.22 - 5.81 MIL/uL   Hemoglobin 13.0  13.0 - 17.0 g/dL   HCT 16.1 (*) 09.6 - 04.5 %   MCV 89.0  78.0 - 100.0 fL   MCH 31.0  26.0 - 34.0 pg   MCHC 34.8  30.0 - 36.0 g/dL   RDW 40.9  81.1 - 91.4 %   Platelets 348  150 - 400 K/uL   Neutrophils Relative 61  43 - 77 %   Neutro Abs 5.1  1.7 - 7.7 K/uL   Lymphocytes Relative 26  12 - 46 %   Lymphs Abs 2.1  0.7 - 4.0 K/uL   Monocytes Relative 10  3 - 12 %   Monocytes Absolute 0.8  0.1 - 1.0 K/uL   Eosinophils Relative 3  0 - 5 %   Eosinophils Absolute 0.2  0.0 - 0.7 K/uL   Basophils Relative 1  0 - 1 %   Basophils Absolute 0.1  0.0 - 0.1 K/uL  BASIC METABOLIC PANEL      Component Value Range   Sodium 139  135 - 145 mEq/L   Potassium 3.6  3.5 - 5.1 mEq/L   Chloride 102  96 - 112 mEq/L   CO2 27  19 - 32 mEq/L   Glucose, Bld 94  70 - 99 mg/dL   BUN 27 (*) 6 - 23 mg/dL   Creatinine, Ser 7.82  0.50 - 1.35 mg/dL   Calcium 95.6  8.4 - 21.3 mg/dL   GFR calc  non Af Amer 59 (*) >90 mL/min   GFR calc Af Amer 68 (*) >90 mL/min  POCT I-STAT TROPONIN I      Component Value Range   Troponin i, poc 0.02  0.00 - 0.08 ng/mL   Comment 3            7:40 PM Lab tests were negative.  Pt resting comfortably after having been given Ativan.  Call to [facility] --> case discussed with Valera Castle, M.D., who advised that it would be safe for pt to go home.  He will arrange followup for him in the office.  1. Stress reaction        Carleene Cooper III, MD 06/06/12 [phone]

## 2012-06-09 ENCOUNTER — Encounter (HOSPITAL_COMMUNITY)
Admission: RE | Admit: 2012-06-09 | Discharge: 2012-06-09 | Disposition: A | Discharge status: Home / Self Care | Payer: Federal, State, Local not specified - PPO | Source: Ambulatory Visit | Attending: Internal Medicine | Admitting: Internal Medicine

## 2012-06-11 ENCOUNTER — Encounter (HOSPITAL_COMMUNITY)
Admission: RE | Admit: 2012-06-11 | Discharge: 2012-06-11 | Disposition: A | Discharge status: Home / Self Care | Payer: Federal, State, Local not specified - PPO | Source: Ambulatory Visit | Attending: Internal Medicine | Admitting: Internal Medicine

## 2012-06-13 ENCOUNTER — Encounter (HOSPITAL_COMMUNITY)
Admission: RE | Admit: 2012-06-13 | Discharge: 2012-06-13 | Disposition: A | Discharge status: Home / Self Care | Payer: Federal, State, Local not specified - PPO | Source: Ambulatory Visit | Attending: Internal Medicine | Admitting: Internal Medicine

## 2012-06-13 ENCOUNTER — Encounter: Payer: Self-pay | Admitting: Internal Medicine

## 2012-06-13 ENCOUNTER — Other Ambulatory Visit: Payer: Self-pay | Admitting: *Deleted

## 2012-06-13 DIAGNOSIS — I10 Essential (primary) hypertension: Secondary | ICD-10-CM

## 2012-06-13 DIAGNOSIS — E782 Mixed hyperlipidemia: Secondary | ICD-10-CM

## 2012-06-16 ENCOUNTER — Encounter (HOSPITAL_COMMUNITY)
Admission: RE | Admit: 2012-06-16 | Discharge: 2012-06-16 | Disposition: A | Discharge status: Home / Self Care | Payer: Federal, State, Local not specified - PPO | Source: Ambulatory Visit | Attending: Internal Medicine | Admitting: Internal Medicine

## 2012-06-18 ENCOUNTER — Ambulatory Visit (HOSPITAL_COMMUNITY)
Admission: RE | Admit: 2012-06-18 | Discharge: 2012-06-18 | Disposition: A | Discharge status: Home / Self Care | Payer: Federal, State, Local not specified - PPO | Source: Ambulatory Visit | Attending: Internal Medicine | Admitting: Internal Medicine

## 2012-06-18 ENCOUNTER — Encounter (HOSPITAL_COMMUNITY)
Admission: RE | Admit: 2012-06-18 | Discharge: 2012-06-18 | Disposition: A | Discharge status: Home / Self Care | Payer: Federal, State, Local not specified - PPO | Source: Ambulatory Visit | Attending: Internal Medicine | Admitting: Internal Medicine

## 2012-06-18 DIAGNOSIS — I10 Essential (primary) hypertension: Secondary | ICD-10-CM

## 2012-06-18 DIAGNOSIS — E785 Hyperlipidemia, unspecified: Secondary | ICD-10-CM | POA: Insufficient documentation

## 2012-06-18 DIAGNOSIS — E782 Mixed hyperlipidemia: Secondary | ICD-10-CM

## 2012-06-18 DIAGNOSIS — I517 Cardiomegaly: Secondary | ICD-10-CM | POA: Diagnosis not present

## 2012-06-18 DIAGNOSIS — I2510 Atherosclerotic heart disease of native coronary artery without angina pectoris: Secondary | ICD-10-CM | POA: Insufficient documentation

## 2012-06-18 DIAGNOSIS — R079 Chest pain, unspecified: Secondary | ICD-10-CM | POA: Insufficient documentation

## 2012-06-18 NOTE — Progress Notes (Signed)
*  PRELIMINARY RESULTS* Echocardiogram 2D Echocardiogram has been performed.  Bradley Hurley 06/18/2012, 11:00 AM

## 2012-06-20 ENCOUNTER — Encounter (HOSPITAL_COMMUNITY)
Admission: RE | Admit: 2012-06-20 | Discharge: 2012-06-20 | Disposition: A | Discharge status: Home / Self Care | Payer: Federal, State, Local not specified - PPO | Source: Ambulatory Visit | Attending: Internal Medicine | Admitting: Internal Medicine

## 2012-06-23 ENCOUNTER — Encounter (HOSPITAL_COMMUNITY)
Admission: RE | Admit: 2012-06-23 | Discharge: 2012-06-23 | Disposition: A | Discharge status: Home / Self Care | Payer: Federal, State, Local not specified - PPO | Source: Ambulatory Visit | Attending: Internal Medicine | Admitting: Internal Medicine

## 2012-06-25 ENCOUNTER — Encounter (HOSPITAL_COMMUNITY)
Admission: RE | Admit: 2012-06-25 | Discharge: 2012-06-25 | Disposition: A | Discharge status: Home / Self Care | Payer: Federal, State, Local not specified - PPO | Source: Ambulatory Visit | Attending: Internal Medicine | Admitting: Internal Medicine

## 2012-06-27 ENCOUNTER — Encounter (HOSPITAL_COMMUNITY): Payer: Federal, State, Local not specified - PPO

## 2012-06-27 ENCOUNTER — Encounter: Payer: Self-pay | Admitting: *Deleted

## 2012-06-30 ENCOUNTER — Encounter (HOSPITAL_COMMUNITY)
Admission: RE | Admit: 2012-06-30 | Discharge: 2012-06-30 | Disposition: A | Discharge status: Home / Self Care | Payer: Federal, State, Local not specified - PPO | Source: Ambulatory Visit | Attending: Internal Medicine | Admitting: Internal Medicine

## 2012-06-30 DIAGNOSIS — Z5189 Encounter for other specified aftercare: Secondary | ICD-10-CM | POA: Diagnosis not present

## 2012-06-30 DIAGNOSIS — I2510 Atherosclerotic heart disease of native coronary artery without angina pectoris: Secondary | ICD-10-CM | POA: Insufficient documentation

## 2012-06-30 DIAGNOSIS — I10 Essential (primary) hypertension: Secondary | ICD-10-CM | POA: Insufficient documentation

## 2012-06-30 DIAGNOSIS — I2109 ST elevation (STEMI) myocardial infarction involving other coronary artery of anterior wall: Secondary | ICD-10-CM | POA: Diagnosis not present

## 2012-07-02 ENCOUNTER — Encounter (HOSPITAL_COMMUNITY)
Admission: RE | Admit: 2012-07-02 | Discharge: 2012-07-02 | Disposition: A | Discharge status: Home / Self Care | Payer: Federal, State, Local not specified - PPO | Source: Ambulatory Visit | Attending: Internal Medicine | Admitting: Internal Medicine

## 2012-07-02 DIAGNOSIS — Z5189 Encounter for other specified aftercare: Secondary | ICD-10-CM | POA: Diagnosis not present

## 2012-07-02 DIAGNOSIS — I2109 ST elevation (STEMI) myocardial infarction involving other coronary artery of anterior wall: Secondary | ICD-10-CM | POA: Diagnosis not present

## 2012-07-02 DIAGNOSIS — I10 Essential (primary) hypertension: Secondary | ICD-10-CM | POA: Diagnosis not present

## 2012-07-02 DIAGNOSIS — I2510 Atherosclerotic heart disease of native coronary artery without angina pectoris: Secondary | ICD-10-CM | POA: Diagnosis not present

## 2012-07-03 ENCOUNTER — Telehealth: Payer: Self-pay | Admitting: *Deleted

## 2012-07-03 NOTE — Telephone Encounter (Signed)
Spoke to pt to advise results/instructions. Pt understood, concerning pt Echo resulted on 06-18-12 Lab results/Imaging/Results forwarded to pt PCP for review

## 2012-07-04 ENCOUNTER — Encounter (HOSPITAL_COMMUNITY)
Admission: RE | Admit: 2012-07-04 | Discharge: 2012-07-04 | Disposition: A | Discharge status: Home / Self Care | Payer: Federal, State, Local not specified - PPO | Source: Ambulatory Visit | Attending: Internal Medicine | Admitting: Internal Medicine

## 2012-07-04 DIAGNOSIS — I2109 ST elevation (STEMI) myocardial infarction involving other coronary artery of anterior wall: Secondary | ICD-10-CM | POA: Diagnosis not present

## 2012-07-04 DIAGNOSIS — Z5189 Encounter for other specified aftercare: Secondary | ICD-10-CM | POA: Diagnosis not present

## 2012-07-04 DIAGNOSIS — I10 Essential (primary) hypertension: Secondary | ICD-10-CM | POA: Diagnosis not present

## 2012-07-04 DIAGNOSIS — I2510 Atherosclerotic heart disease of native coronary artery without angina pectoris: Secondary | ICD-10-CM | POA: Diagnosis not present

## 2012-07-07 ENCOUNTER — Encounter (HOSPITAL_COMMUNITY)
Admission: RE | Admit: 2012-07-07 | Discharge: 2012-07-07 | Disposition: A | Discharge status: Home / Self Care | Payer: Federal, State, Local not specified - PPO | Source: Ambulatory Visit | Attending: Internal Medicine | Admitting: Internal Medicine

## 2012-07-07 DIAGNOSIS — I10 Essential (primary) hypertension: Secondary | ICD-10-CM | POA: Diagnosis not present

## 2012-07-07 DIAGNOSIS — I2109 ST elevation (STEMI) myocardial infarction involving other coronary artery of anterior wall: Secondary | ICD-10-CM | POA: Diagnosis not present

## 2012-07-07 DIAGNOSIS — I2510 Atherosclerotic heart disease of native coronary artery without angina pectoris: Secondary | ICD-10-CM | POA: Diagnosis not present

## 2012-07-07 DIAGNOSIS — Z5189 Encounter for other specified aftercare: Secondary | ICD-10-CM | POA: Diagnosis not present

## 2012-07-09 ENCOUNTER — Encounter (HOSPITAL_COMMUNITY)
Admission: RE | Admit: 2012-07-09 | Discharge: 2012-07-09 | Disposition: A | Discharge status: Home / Self Care | Payer: Federal, State, Local not specified - PPO | Source: Ambulatory Visit | Attending: Internal Medicine | Admitting: Internal Medicine

## 2012-07-09 DIAGNOSIS — I2510 Atherosclerotic heart disease of native coronary artery without angina pectoris: Secondary | ICD-10-CM | POA: Diagnosis not present

## 2012-07-09 DIAGNOSIS — Z5189 Encounter for other specified aftercare: Secondary | ICD-10-CM | POA: Diagnosis not present

## 2012-07-09 DIAGNOSIS — I10 Essential (primary) hypertension: Secondary | ICD-10-CM | POA: Diagnosis not present

## 2012-07-09 DIAGNOSIS — I2109 ST elevation (STEMI) myocardial infarction involving other coronary artery of anterior wall: Secondary | ICD-10-CM | POA: Diagnosis not present

## 2012-07-11 ENCOUNTER — Encounter (HOSPITAL_COMMUNITY)
Admission: RE | Admit: 2012-07-11 | Discharge: 2012-07-11 | Disposition: A | Discharge status: Home / Self Care | Payer: Federal, State, Local not specified - PPO | Source: Ambulatory Visit | Attending: Internal Medicine | Admitting: Internal Medicine

## 2012-07-11 DIAGNOSIS — I2510 Atherosclerotic heart disease of native coronary artery without angina pectoris: Secondary | ICD-10-CM | POA: Diagnosis not present

## 2012-07-11 DIAGNOSIS — I10 Essential (primary) hypertension: Secondary | ICD-10-CM | POA: Diagnosis not present

## 2012-07-11 DIAGNOSIS — I2109 ST elevation (STEMI) myocardial infarction involving other coronary artery of anterior wall: Secondary | ICD-10-CM | POA: Diagnosis not present

## 2012-07-11 DIAGNOSIS — Z5189 Encounter for other specified aftercare: Secondary | ICD-10-CM | POA: Diagnosis not present

## 2012-07-14 ENCOUNTER — Encounter (HOSPITAL_COMMUNITY)
Admission: RE | Admit: 2012-07-14 | Discharge: 2012-07-14 | Disposition: A | Discharge status: Home / Self Care | Payer: Federal, State, Local not specified - PPO | Source: Ambulatory Visit | Attending: Internal Medicine | Admitting: Internal Medicine

## 2012-07-14 DIAGNOSIS — I10 Essential (primary) hypertension: Secondary | ICD-10-CM | POA: Diagnosis not present

## 2012-07-14 DIAGNOSIS — I2109 ST elevation (STEMI) myocardial infarction involving other coronary artery of anterior wall: Secondary | ICD-10-CM | POA: Diagnosis not present

## 2012-07-14 DIAGNOSIS — I2510 Atherosclerotic heart disease of native coronary artery without angina pectoris: Secondary | ICD-10-CM | POA: Diagnosis not present

## 2012-07-14 DIAGNOSIS — Z5189 Encounter for other specified aftercare: Secondary | ICD-10-CM | POA: Diagnosis not present

## 2012-07-16 ENCOUNTER — Encounter (HOSPITAL_COMMUNITY)
Admission: RE | Admit: 2012-07-16 | Discharge: 2012-07-16 | Disposition: A | Discharge status: Home / Self Care | Payer: Federal, State, Local not specified - PPO | Source: Ambulatory Visit | Attending: Internal Medicine | Admitting: Internal Medicine

## 2012-07-16 DIAGNOSIS — I2510 Atherosclerotic heart disease of native coronary artery without angina pectoris: Secondary | ICD-10-CM | POA: Diagnosis not present

## 2012-07-16 DIAGNOSIS — I2109 ST elevation (STEMI) myocardial infarction involving other coronary artery of anterior wall: Secondary | ICD-10-CM | POA: Diagnosis not present

## 2012-07-16 DIAGNOSIS — Z5189 Encounter for other specified aftercare: Secondary | ICD-10-CM | POA: Diagnosis not present

## 2012-07-16 DIAGNOSIS — I10 Essential (primary) hypertension: Secondary | ICD-10-CM | POA: Diagnosis not present

## 2012-07-18 ENCOUNTER — Encounter (HOSPITAL_COMMUNITY)
Admission: RE | Admit: 2012-07-18 | Discharge: 2012-07-18 | Disposition: A | Discharge status: Home / Self Care | Payer: Federal, State, Local not specified - PPO | Source: Ambulatory Visit | Attending: Internal Medicine | Admitting: Internal Medicine

## 2012-07-18 DIAGNOSIS — I10 Essential (primary) hypertension: Secondary | ICD-10-CM | POA: Diagnosis not present

## 2012-07-18 DIAGNOSIS — Z5189 Encounter for other specified aftercare: Secondary | ICD-10-CM | POA: Diagnosis not present

## 2012-07-18 DIAGNOSIS — I2510 Atherosclerotic heart disease of native coronary artery without angina pectoris: Secondary | ICD-10-CM | POA: Diagnosis not present

## 2012-07-18 DIAGNOSIS — I2109 ST elevation (STEMI) myocardial infarction involving other coronary artery of anterior wall: Secondary | ICD-10-CM | POA: Diagnosis not present

## 2012-07-21 ENCOUNTER — Encounter (HOSPITAL_COMMUNITY)
Admission: RE | Admit: 2012-07-21 | Discharge: 2012-07-21 | Disposition: A | Discharge status: Home / Self Care | Payer: Federal, State, Local not specified - PPO | Source: Ambulatory Visit | Attending: Internal Medicine | Admitting: Internal Medicine

## 2012-07-21 DIAGNOSIS — I2109 ST elevation (STEMI) myocardial infarction involving other coronary artery of anterior wall: Secondary | ICD-10-CM | POA: Diagnosis not present

## 2012-07-21 DIAGNOSIS — I2510 Atherosclerotic heart disease of native coronary artery without angina pectoris: Secondary | ICD-10-CM | POA: Diagnosis not present

## 2012-07-21 DIAGNOSIS — I10 Essential (primary) hypertension: Secondary | ICD-10-CM | POA: Diagnosis not present

## 2012-07-21 DIAGNOSIS — Z5189 Encounter for other specified aftercare: Secondary | ICD-10-CM | POA: Diagnosis not present

## 2012-07-23 ENCOUNTER — Encounter (HOSPITAL_COMMUNITY): Payer: Federal, State, Local not specified - PPO

## 2012-07-25 ENCOUNTER — Encounter (HOSPITAL_COMMUNITY): Payer: Federal, State, Local not specified - PPO

## 2012-07-26 ENCOUNTER — Emergency Department (HOSPITAL_COMMUNITY): Payer: Federal, State, Local not specified - PPO

## 2012-07-26 ENCOUNTER — Ambulatory Visit (HOSPITAL_COMMUNITY)
Admission: EM | Admit: 2012-07-26 | Discharge: 2012-07-28 | Disposition: A | Discharge status: Skilled Nursing Facility | Payer: Federal, State, Local not specified - PPO | Attending: Emergency Medicine | Admitting: Emergency Medicine

## 2012-07-26 ENCOUNTER — Encounter (HOSPITAL_COMMUNITY): Payer: Self-pay | Admitting: Emergency Medicine

## 2012-07-26 DIAGNOSIS — Z79899 Other long term (current) drug therapy: Secondary | ICD-10-CM | POA: Insufficient documentation

## 2012-07-26 DIAGNOSIS — F3289 Other specified depressive episodes: Secondary | ICD-10-CM | POA: Insufficient documentation

## 2012-07-26 DIAGNOSIS — E785 Hyperlipidemia, unspecified: Secondary | ICD-10-CM | POA: Diagnosis not present

## 2012-07-26 DIAGNOSIS — I10 Essential (primary) hypertension: Secondary | ICD-10-CM | POA: Diagnosis not present

## 2012-07-26 DIAGNOSIS — I200 Unstable angina: Secondary | ICD-10-CM | POA: Insufficient documentation

## 2012-07-26 DIAGNOSIS — I252 Old myocardial infarction: Secondary | ICD-10-CM | POA: Diagnosis not present

## 2012-07-26 DIAGNOSIS — F329 Major depressive disorder, single episode, unspecified: Secondary | ICD-10-CM | POA: Insufficient documentation

## 2012-07-26 DIAGNOSIS — E669 Obesity, unspecified: Secondary | ICD-10-CM | POA: Insufficient documentation

## 2012-07-26 DIAGNOSIS — I249 Acute ischemic heart disease, unspecified: Secondary | ICD-10-CM

## 2012-07-26 DIAGNOSIS — Z888 Allergy status to other drugs, medicaments and biological substances status: Secondary | ICD-10-CM

## 2012-07-26 DIAGNOSIS — I2510 Atherosclerotic heart disease of native coronary artery without angina pectoris: Secondary | ICD-10-CM | POA: Diagnosis not present

## 2012-07-26 HISTORY — DX: Atherosclerotic heart disease of native coronary artery without angina pectoris: I25.10

## 2012-07-26 LAB — CBC WITH DIFFERENTIAL/PLATELET
Basophils Relative: 2 % — ABNORMAL HIGH (ref 0–1)
Hemoglobin: 14.1 g/dL (ref 13.0–17.0)
Lymphs Abs: 3.1 10*3/uL (ref 0.7–4.0)
Monocytes Relative: 9 % (ref 3–12)
Neutro Abs: 4.5 10*3/uL (ref 1.7–7.7)
Neutrophils Relative %: 51 % (ref 43–77)
RBC: 4.49 MIL/uL (ref 4.22–5.81)

## 2012-07-26 LAB — TROPONIN I: Troponin I: 0.35 ng/mL (ref <0.30)

## 2012-07-26 LAB — BASIC METABOLIC PANEL
BUN: 21 mg/dL (ref 6–23)
Chloride: 103 mEq/L (ref 96–112)
GFR calc Af Amer: 75 mL/min — ABNORMAL LOW (ref >90)
Glucose, Bld: 109 mg/dL — ABNORMAL HIGH (ref 70–99)
Potassium: 3.5 mEq/L (ref 3.5–5.1)

## 2012-07-26 LAB — MRSA PCR SCREENING: MRSA by PCR: NEGATIVE

## 2012-07-26 LAB — PROTIME-INR
INR: 1.01 (ref 0.00–1.49)
Prothrombin Time: 13.2 seconds (ref 11.6–15.2)

## 2012-07-26 MED ORDER — ZOLPIDEM TARTRATE 5 MG PO TABS: 5.0000 mg | ORAL_TABLET | Freq: Every evening | ORAL | Status: DC | PRN

## 2012-07-26 MED ORDER — SIMVASTATIN 20 MG PO TABS
20.0000 mg | ORAL_TABLET | Freq: Every day | ORAL | Status: DC
  Administered 2012-07-26 – 2012-07-27 (×2): 20 mg via ORAL
  Filled 2012-07-26 (×4): qty 1

## 2012-07-26 MED ORDER — FLUTICASONE PROPIONATE 50 MCG/ACT NA SUSP
2.0000 | Freq: Every day | NASAL | Status: DC | PRN
  Filled 2012-07-26: qty 16

## 2012-07-26 MED ORDER — NITROGLYCERIN 0.4 MG SL SUBL
0.4000 mg | SUBLINGUAL_TABLET | Freq: Once | SUBLINGUAL | Status: AC
  Administered 2012-07-26: 0.4 mg via SUBLINGUAL

## 2012-07-26 MED ORDER — SODIUM CHLORIDE 0.9 % IV SOLN: 250.0000 mL | INTRAVENOUS | Status: DC | PRN

## 2012-07-26 MED ORDER — SODIUM CHLORIDE 0.9 % IV SOLN: 20.0000 mL | INTRAVENOUS | Status: DC

## 2012-07-26 MED ORDER — NITROGLYCERIN 0.4 MG SL SUBL
0.4000 mg | SUBLINGUAL_TABLET | Freq: Once | SUBLINGUAL | Status: AC
  Administered 2012-07-26: 0.4 mg via SUBLINGUAL
  Filled 2012-07-26: qty 25

## 2012-07-26 MED ORDER — NITROGLYCERIN 0.4 MG SL SUBL: 0.4000 mg | SUBLINGUAL_TABLET | SUBLINGUAL | Status: DC | PRN

## 2012-07-26 MED ORDER — NITROGLYCERIN IN D5W 200-5 MCG/ML-% IV SOLN
5.0000 ug/min | INTRAVENOUS | Status: DC
  Administered 2012-07-26: 5 ug/min via INTRAVENOUS
  Filled 2012-07-26: qty 250

## 2012-07-26 MED ORDER — HEPARIN BOLUS VIA INFUSION
4000.0000 [IU] | Freq: Once | INTRAVENOUS | Status: AC
  Administered 2012-07-26: 4000 [IU] via INTRAVENOUS

## 2012-07-26 MED ORDER — ONDANSETRON HCL 4 MG/2ML IJ SOLN: 4.0000 mg | Freq: Four times a day (QID) | INTRAMUSCULAR | Status: DC | PRN

## 2012-07-26 MED ORDER — LISINOPRIL 10 MG PO TABS
10.0000 mg | ORAL_TABLET | Freq: Every day | ORAL | Status: DC
  Administered 2012-07-27: 10 mg via ORAL
  Filled 2012-07-26 (×2): qty 1

## 2012-07-26 MED ORDER — SODIUM CHLORIDE 0.9 % IV SOLN
Freq: Once | INTRAVENOUS | Status: AC
  Administered 2012-07-26: 12:00:00 via INTRAVENOUS

## 2012-07-26 MED ORDER — SODIUM CHLORIDE 0.9 % IJ SOLN
3.0000 mL | Freq: Two times a day (BID) | INTRAMUSCULAR | Status: DC
  Administered 2012-07-26 – 2012-07-27 (×3): 3 mL via INTRAVENOUS

## 2012-07-26 MED ORDER — LORAZEPAM 0.5 MG PO TABS: 0.5000 mg | ORAL_TABLET | Freq: Three times a day (TID) | ORAL | Status: DC | PRN

## 2012-07-26 MED ORDER — SODIUM CHLORIDE 0.9 % IJ SOLN: 3.0000 mL | INTRAMUSCULAR | Status: DC | PRN

## 2012-07-26 MED ORDER — ALPRAZOLAM 0.25 MG PO TABS: 0.2500 mg | ORAL_TABLET | Freq: Two times a day (BID) | ORAL | Status: DC | PRN

## 2012-07-26 MED ORDER — SODIUM CHLORIDE 0.9 % IV SOLN: Freq: Once | INTRAVENOUS | Status: DC

## 2012-07-26 MED ORDER — METOPROLOL TARTRATE 25 MG PO TABS
25.0000 mg | ORAL_TABLET | Freq: Two times a day (BID) | ORAL | Status: DC
  Administered 2012-07-26 – 2012-07-27 (×3): 25 mg via ORAL
  Filled 2012-07-26 (×5): qty 1

## 2012-07-26 MED ORDER — HEPARIN (PORCINE) IN NACL 100-0.45 UNIT/ML-% IJ SOLN
1250.0000 [IU]/h | INTRAMUSCULAR | Status: DC
  Administered 2012-07-26: 1100 [IU]/h via INTRAVENOUS
  Administered 2012-07-27: 1250 [IU]/h via INTRAVENOUS
  Filled 2012-07-26 (×3): qty 250

## 2012-07-26 MED ORDER — PNEUMOCOCCAL VAC POLYVALENT 25 MCG/0.5ML IJ INJ
0.5000 mL | INJECTION | INTRAMUSCULAR | Status: AC
  Filled 2012-07-26: qty 0.5

## 2012-07-26 MED ORDER — INFLUENZA VIRUS VACC SPLIT PF IM SUSP
0.5000 mL | INTRAMUSCULAR | Status: AC
  Filled 2012-07-26: qty 0.5

## 2012-07-26 MED ORDER — HYDROCHLOROTHIAZIDE 25 MG PO TABS
12.5000 mg | ORAL_TABLET | Freq: Every day | ORAL | Status: DC
  Administered 2012-07-27: 12.5 mg via ORAL
  Filled 2012-07-26 (×2): qty 0.5

## 2012-07-26 MED ORDER — ACETAMINOPHEN 325 MG PO TABS: 650.0000 mg | ORAL_TABLET | ORAL | Status: DC | PRN

## 2012-07-26 MED ORDER — PRASUGREL HCL 10 MG PO TABS
10.0000 mg | ORAL_TABLET | Freq: Every day | ORAL | Status: DC
  Administered 2012-07-27: 10 mg via ORAL
  Filled 2012-07-26 (×2): qty 1

## 2012-07-26 MED ORDER — HEPARIN (PORCINE) IN NACL 100-0.45 UNIT/ML-% IJ SOLN
INTRAMUSCULAR | Status: AC
  Administered 2012-07-26: 25000 [IU]
  Filled 2012-07-26: qty 250

## 2012-07-26 NOTE — ED Provider Notes (Addendum)
History  This chart was scribed for Hilario Quarry, MD by Erskine Emery, ED Scribe. This patient was seen in room APA16A/APA16A and the patient's care was started at 11:14.   CSN: [id number]  Arrival date & time 07/26/12  1107   First MD Initiated Contact with Patient 07/26/12 1114      Chief Complaint  Patient presents with  . Chest Pain    (Consider location/radiation/quality/duration/timing/severity/associated sxs/prior treatment) The history is provided by the patient. No language interpreter was used.  [place] Nordquist is a 66 y.o. male brought in by ambulance, who presents to the Emergency Department complaining of sudden onset right anterior chest pain that radiates up to the right right jaw since standing in the shower, 20 minutes ago. Pt reports the pain was a 9/10 originally and now is a 3/10.  Pt reports some associated SOB, diaphoresis, feeling hot, and facial numbness. Pt had his first MI on September 17th and reports this episode feels similar, but from a different location and with less cold sweats. He was catheterized during that hospitalization. Pt is allergic to aspirin, it closes his throat and he has not yet had any nitro or pain medication today. Pt has been going to cardiac rehab, Monday, Wednesday, Friday. He has been seen by Dr. Tenny Craw at [facility] cardiology once since his heart attack. He has had one episode of chest pain since then, for which he came here, but it was related to stress and indigestion and not his heart. Pt had 2 stents placed 10 years ago, after an episode of angina. Pt also has a h/o ASCVD, HTN, obstructive sleep apnea, and hyperlipidemia. Pt reports he has been taking all of his medications as prescribed, including this morning.  Dr. Sudie Bailey is the pt's PCP.  Past Medical History  Diagnosis Date  . 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 Hypertension     Lab 03/2012: Normal CMet, TSH, CBC, PSA  . Hyperlipidemia     Lipid profile in 03/2012:182, 295,28,413  . Obstructive sleep apnea 05/2011    [facility]-05/2011  . Tinnitus   . Erectile dysfunction     Low serum testosterone level  . Depression   . Obesity   . Deviated septum   . 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 MI (myocardial infarction)     Past Surgical History  Procedure Date  . Splenectomy   . Appendectomy   . Coronary stent placement 04/11/12    Family History  Problem Relation Age of Onset  . Arrhythmia Father   . Cancer Mother     History  Substance Use Topics  . Smoking status: Former Smoker -- 1.0 packs/day for 15 years  . Smokeless tobacco: Former Neurosurgeon    Quit date: 01/30/1981     Comment: Quit in 1982  . Alcohol Use: 0.5 oz/week    1 drink(s) per week     Comment: Rare consumption - once/twice per week      Review of Systems  Constitutional: Positive for diaphoresis. Negative for fever and chills.  Respiratory: Positive for shortness of breath.   Cardiovascular: Positive for chest pain.  Gastrointestinal: Negative for nausea and vomiting.  Neurological: Positive for numbness (facial). Negative for weakness.  All other systems reviewed and are negative.  Allergies  Aspirin  Home Medications   Current Outpatient Rx  Name  Route  Sig  Dispense  Refill  . FLUTICASONE PROPIONATE 50 MCG/ACT NA SUSP   Nasal   Place 2 sprays into the nose daily.         Marland Kitchen HYDROCHLOROTHIAZIDE 25 MG PO TABS   Oral   Take 0.5 tablets (12.5 mg total) by mouth daily.   30 tablet   11   . LISINOPRIL 10 MG PO TABS   Oral   Take 1 tablet (10 mg total) by mouth daily.   30 tablet   11   . LORAZEPAM 1 MG PO TABS   Oral   Take 0.5 tablets (0.5 mg total) by mouth 3 (three) times daily as needed for anxiety.   15 tablet   0   . METOPROLOL TARTRATE 25 MG PO  TABS   Oral   Take 1 tablet (25 mg total) by mouth 2 (two) times daily.   60 tablet   10   . PRASUGREL HCL 10 MG PO TABS   Oral   Take 1 tablet (10 mg total) by mouth daily.   30 tablet   11   . PRAVASTATIN SODIUM 40 MG PO TABS   Oral   Take 40 mg by mouth daily.           BP 212/97  Pulse 83  Temp 97.9 F (36.6 C) (Oral)  Resp 20  SpO2 100%  Physical Exam  Nursing note and vitals reviewed. Constitutional: He is oriented to person, place, and time. He appears well-developed and well-nourished. No distress.  HENT:  Head: Normocephalic and atraumatic.  Eyes: EOM are normal. Pupils are equal, round, and reactive to light.  Neck: Neck supple. No tracheal deviation present.  Cardiovascular: Normal rate.   Pulmonary/Chest: Effort normal. No respiratory distress.  Abdominal: Soft. He exhibits no distension.  Musculoskeletal: Normal range of motion. He exhibits no edema.  Neurological: He is alert and oriented to person, place, and time.  Skin: Skin is warm and dry.  Psychiatric: He has a normal mood and affect.    ED Course  Procedures (including critical care time) DIAGNOSTIC STUDIES: Oxygen Saturation is 100% on room air, normal by my interpretation.    COORDINATION OF CARE: 11:20--I evaluated the patient and we discussed a treatment plan including chest x-Izabelle Daus to which the pt agreed. His systolic blood pressure was 210 and now it's 178.  11:52--I rechecked the pt who has had 2 nitros and his chest pain is now a 1/10. He does report some cold sweats and dizziness and his blood pressure is down to 84/48.Patient given fluid bolus and bp rapidly returned to normotensive.   12:04--I rechecked the pt and let him know that his labs and EKG show he is not having a stemi  but possible ischemia. I told him we would transfer him to [facility] for cardiology. Pt reports he missed his medication yesterday but took everything as prescribed today. I told him we would put him on a  blood thinner.   13:56--I rechecked the pt who is ready for transfer.  Labs Reviewed - No data to display No results found.   No diagnosis found.  I personally performed the services described in this documentation, which was scribed in my presence. The recorded information has been reviewed and considered.   Date: 07/27/2012  Rate: 81  Rhythm: normal sinus rhythm  QRS Axis: normal  Intervals: normal  ST/T Wave  abnormalities: nonspecific ST changes  Conduction Disutrbances:none  Narrative Interpretation:   Old EKG Reviewed: changes noted   MDM  Patient pain free after nitro x 2 here.   Patient has taken his effient today but is allergic with throat swelling to aspirin.  Patient to be heparininzed by pharmacy protocol.  Patient with nonspecific st changes and discussed with Dr. Anne Fu and the patient will be transferred to [facility].  Patient remained pain free and hemodynamically stable here while awaiting transfer.      Date: 07/26/2012  Rate: 81  Rhythm: normal sinus rhythm  QRS Axis: normal  Intervals: normal  ST/T Wave abnormalities: nonspecific ST changes  Conduction Disutrbances:none  Narrative Interpretation:   Old EKG Reviewed: 06/06/12 without st changes, nsst changes new today   MDM  Discussed with Dr. Chales Abrahams and he has accepted patient to [facility].\  Patient pain free after 2 sublingual that was. He did have an episode of transient hypotension and received a 500 cc bolus. IV nitroglycerin is in place. Discussed patient's care with Dr. Chales Abrahams and although patient is allergic to aspirin he is taking effient. He did take his effient today.  Heparin is ordered per pharmacy consult. First set of troponin is negative. Patient feels improved. Awaiting bed assignment at [facility] from bed control.  Acute coronary syndrome   I personally performed the services described in this documentation, which was scribed in my presence. The recorded information has been reviewed and  considered.   CRITICAL CARE Performed by: Hilario Quarry   Total critical care time: 60  Critical care time was exclusive of separately billable procedures and treating other patients.  Critical care was necessary to treat or prevent imminent or life-threatening deterioration.  Critical care was time spent personally by me on the following activities: development of treatment plan with patient and/or surrogate as well as nursing, discussions with consultants, evaluation of patient's response to treatment, examination of patient, obtaining history from patient or surrogate, ordering and performing treatments and interventions, ordering and review of laboratory studies, ordering and review of radiographic studies, pulse oximetry and re-evaluation of patient's condition.  CRITICAL CARE Performed by: Hilario Quarry   Total critical care time: 45  Critical care time was exclusive of separately billable procedures and treating other patients.  Critical care was necessary to treat or prevent imminent or life-threatening deterioration.  Critical care was time spent personally by me on the following activities: development of treatment plan with patient and/or surrogate as well as nursing, discussions with consultants, evaluation of patient's response to treatment, examination of patient, obtaining history from patient or surrogate, ordering and performing treatments and interventions, ordering and review of laboratory studies, ordering and review of radiographic studies, pulse oximetry and re-evaluation of patient's condition.   Hilario Quarry, MD 07/27/12 1610  Hilario Quarry, MD 07/28/12 [phone]

## 2012-07-26 NOTE — ED Notes (Signed)
Spoke with Margo at [employer]. Rm 2920 given but Pt is still in that rm, and she will call as soon as that Pt leaves.

## 2012-07-26 NOTE — Progress Notes (Signed)
CRITICAL VALUE ALERT  Critical value received: troponin .35  Date of notification:  07/26/2012  Time of notification:  1925  Critical value read back:yes  Nurse who received alert:  Nettie Elm RN  MD notified (1st page):  Dr. Diona Browner  Time of first page:  1924  MD notified (2nd page):  Time of second page:  Responding MD:  Diona Browner  Time MD responded:  [phone]

## 2012-07-26 NOTE — Progress Notes (Signed)
ANTICOAGULATION CONSULT NOTE - Initial Consult  Pharmacy Consult for Heparin Indication: chest pain/ACS  Allergies  Allergen Reactions  . Aspirin Anaphylaxis    Throat closes    Patient Measurements:  Heparin Dosing Weight: 89.5 KG   Vital Signs: Temp: 97.9 F (36.6 C) (11/30 1115) Temp src: Oral (11/30 1115) BP: 137/74 mmHg (11/30 1210) Pulse Rate: 69  (11/30 1158)  Labs:  Basename 07/26/12 1135 07/26/12 1125  HGB -- 14.1  HCT -- 40.8  PLT -- 383  APTT 28 --  LABPROT 13.2 --  INR 1.01 --  HEPARINUNFRC -- --  CREATININE -- 1.15  CKTOTAL -- --  CKMB -- --  TROPONINI -- --    The CrCl is unknown because both a height and weight (above a minimum accepted value) are required for this calculation.   Medical History: Past Medical History  Diagnosis Date  . 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 Hypertension     Lab 03/2012: Normal CMet, TSH, CBC, PSA  . Hyperlipidemia     Lipid profile in 03/2012:182, 696,29,528  . Obstructive sleep apnea 05/2011    [facility]-05/2011  . Tinnitus   . Erectile dysfunction     Low serum testosterone level  . Depression   . Obesity   . Deviated septum   . 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 MI (myocardial infarction)     Medications:  Scheduled:    . heparin      . [COMPLETED] nitroGLYCERIN  0.4 mg Sublingual Once  . [COMPLETED] nitroGLYCERIN  0.4 mg Sublingual Once    Assessment: Ok for protocol  Goal of Therapy:  Heparin level 0.3-0.7 units/ml Monitor platelets by anticoagulation protocol: Yes   Plan:  Give 4000 units bolus x 1 Start heparin infusion at 1100 units/hr Check anti-Xa level in 6 hours and daily while on heparin Continue to monitor H&H and platelets  Bradley Hurley, Bradley Hurley 07/26/2012,12:24  PM

## 2012-07-26 NOTE — ED Notes (Signed)
Pt c/o left chest pain that radiates left jaw and shoulder that began 5-10 mins ago. Pt also c/o SOB and "numbness in face". Pt states he had "massive heart attack" on September 17 and this feels the same. Pt has allergy to aspirin and is not rx nitro and has not taken any pain meds pta. Pt was ambulatory but in distress. Pt is A&O.

## 2012-07-26 NOTE — ED Notes (Signed)
Pt given 500cc bolus of ns per dr. Denny Levy orders.

## 2012-07-26 NOTE — Progress Notes (Signed)
ANTICOAGULATION CONSULT NOTE - Follow Up Consult  Pharmacy Consult for heparin Indication: chest pain/ACS  Allergies  Allergen Reactions  . Aspirin Anaphylaxis    Throat closes    Patient Measurements: Height: 5\' 5"  (165.1 cm) Weight: 199 lb 15.3 oz (90.7 kg) IBW/kg (Calculated) : 61.5  Heparin Dosing Weight: 90 kg  Vital Signs: Temp: 99 F (37.2 C) (11/30 2000) Temp src: Oral (11/30 2000) BP: 155/74 mmHg (11/30 2000) Pulse Rate: 62  (11/30 2000)  Labs:  Basename 07/26/12 2018 07/26/12 1750 07/26/12 1135 07/26/12 1125  HGB -- -- -- 14.1  HCT -- -- -- 40.8  PLT -- -- -- 383  APTT -- -- 28 --  LABPROT -- -- 13.2 --  INR -- -- 1.01 --  HEPARINUNFRC 0.29* -- -- --  CREATININE -- -- -- 1.15  CKTOTAL -- -- -- --  CKMB -- -- -- --  TROPONINI -- 0.35* -- --    Estimated Creatinine Clearance: 65.4 ml/min (by C-G formula based on Cr of 1.15).   Medications:  Scheduled:    . [COMPLETED] sodium chloride   Intravenous Once  . [COMPLETED] heparin      . [COMPLETED] heparin  4,000 Units Intravenous Once  . hydrochlorothiazide  12.5 mg Oral Daily  . influenza  inactive virus vaccine  0.5 mL Intramuscular Tomorrow-1000  . lisinopril  10 mg Oral Daily  . metoprolol tartrate  25 mg Oral BID  . [COMPLETED] nitroGLYCERIN  0.4 mg Sublingual Once  . [COMPLETED] nitroGLYCERIN  0.4 mg Sublingual Once  . pneumococcal 23 valent vaccine  0.5 mL Intramuscular Tomorrow-1000  . prasugrel  10 mg Oral Daily  . simvastatin  20 mg Oral q1800  . sodium chloride  3 mL Intravenous Q12H  . [DISCONTINUED] sodium chloride   Intravenous Once   Infusions:    . sodium chloride 20 mL (07/26/12 2000)  . heparin 1,100 Units/hr (07/26/12 2000)  . nitroGLYCERIN 5 mcg/min (07/26/12 2000)    Assessment: 66 yo male with chest pain is currently on subtherapeutic heparin.  Heparin level was 0.29 Goal of Therapy:  Heparin level 0.3-0.7 units/ml Monitor platelets by anticoagulation protocol: Yes     Plan:  1) Increase heparin drip to 1250 units/hr 2) 6hr heparin level  Myracle Febres, Tsz-Yin 07/26/2012,9:47 PM

## 2012-07-26 NOTE — ED Notes (Signed)
Pt reports approx pta had finished taking a shower and had sudden onset of severe r sided chest pain into r jaw.  Dr. Rosalia Hammers at bedside at this time.

## 2012-07-26 NOTE — H&P (Signed)
Primary cardiologist: Dr. Dietrich Pates  Clinical Summary Mr. Seright is a 66 y.o.male with past medical history outlined below, transferred from [facility] ER today secondary to recurrent chest pain symptoms and ECG changes concerning for unstable angina. He was seen by Dr. Tenny Craw in followup back in October, clinically stable at that time. He had presented with an anterior STEMI in August and underwent thrombectomy of the mid LAD with placement of DES x2 in the proximal and mid segment. He had had previous stenting to the LAD in 2003. He does have an aspirin allergy, states that he has been compliant with Effient.  He reports being in his usual state of health until late morning today. He states that he was watching television with his kids, "horsing around," and then suddenly while standing still he developed an intense discomfort in his mid sternal region reminding him of his MI symptoms from August. This radiated into his neck, also associated with mild diaphoresis. He did not have nitroglycerin to take. He presented quickly to the ER, his symptoms resolved after nitroglycerin, subsequently placed on heparin as well. Total duration of symptoms approximately 30 minutes. ECG shows sinus rhythm with more pronounced ST segment depression in the inferolateral leads compared to prior tracing. His initial cardiac markers are normal.  Recent testing includes an echocardiogram from 10/23 demonstrating mild to moderate LVH with LVEF 60%, no focal wall motion abnormalities, grade 1 diastolic dysfunction, sclerotic aortic valve, moderately dilated left atrium  Also mildly dilated RV and mildly reduced RV function.   Allergies  Allergen Reactions  . Aspirin Anaphylaxis    Throat closes    Home Medications Prescriptions prior to admission  Medication Sig Dispense Refill  . fluticasone (FLONASE) 50 MCG/ACT nasal spray Place 2 sprays into the nose daily as needed. For allergies      . hydrochlorothiazide  (HYDRODIURIL) 25 MG tablet Take 0.5 tablets (12.5 mg total) by mouth daily.  30 tablet  11  . lisinopril (PRINIVIL,ZESTRIL) 10 MG tablet Take 1 tablet (10 mg total) by mouth daily.  30 tablet  11  . LORazepam (ATIVAN) 1 MG tablet Take 0.5 tablets (0.5 mg total) by mouth 3 (three) times daily as needed for anxiety.  15 tablet  0  . metoprolol tartrate (LOPRESSOR) 25 MG tablet Take 1 tablet (25 mg total) by mouth 2 (two) times daily.  60 tablet  10  . prasugrel (EFFIENT) 10 MG TABS Take 1 tablet (10 mg total) by mouth daily.  30 tablet  11  . pravastatin (PRAVACHOL) 40 MG tablet Take 40 mg by mouth daily.        Past Medical History  Diagnosis Date  . Coronary atherosclerosis of native coronary artery 07/2002    Previous patient of [facility]; PCI  2003-90% septal perforator, 90% proximal and mid LAD->DES x2, normal CX, luminal irregularities in RCA, normal EF  . Essential hypertension, benign   . Hyperlipidemia     Lipid profile in 03/2012:182, 161,09,604  . Obstructive sleep apnea     [facility] - 05/2011  . Tinnitus   . Erectile dysfunction     Low serum testosterone level  . Depression   . Obesity   . Deviated septum   . 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 MI (myocardial infarction)     Anterior STEMI 8/13    Past Surgical History  Procedure Date  . Splenectomy   . Appendectomy  Family History  Problem Relation Age of Onset  . Arrhythmia Father   . Cancer Mother     Social History Mr. Corbo reports that he has quit smoking. His smoking use included Cigarettes. He has a 15 pack-year smoking history. He quit smokeless tobacco use about 31 years ago. Mr. Kalmar reports that he drinks about .5 ounces of alcohol per week.  Review of Systems No palpitations or syncope. No reported bleeding episodes. No orthopnea or PND. Reports stable appetite and bowel movements. Still has his gallbladder. No cough, fevers or chills. Otherwise  negative.  Physical Examination Temp:  [97.9 F (36.6 C)-98.2 F (36.8 C)] 98.2 F (36.8 C) (11/30 1616) Pulse Rate:  [56-83] 56  (11/30 1615) Resp:  [14-21] 20  (11/30 1615) BP: (84-212)/(58-108) 139/74 mmHg (11/30 1615) SpO2:  [94 %-100 %] 100 % (11/30 1615)  Patient in no acute distress. No active chest pain. HEENT: Conjunctiva and lids normal, oropharynx clear with moist mucosa. Neck: Supple, no elevated JVP or carotid bruits, no thyromegaly. Lungs: Clear to auscultation, nonlabored breathing at rest. Cardiac: Regular rate and rhythm, no S3, soft systolic murmur, no pericardial rub. Abdomen: Soft, nontender, bowel sounds present, no guarding or rebound. Extremities: No pitting edema, distal pulses 2+. Skin: Warm and dry. Musculoskeletal: No kyphosis. Neuropsychiatric: Alert and oriented x3, affect grossly appropriate.   Lab Results Basic Metabolic Panel:  Lab 07/26/12 4098  NA 142  K 3.5  CL 103  CO2 27  GLUCOSE 109*  BUN 21  CREATININE 1.15  CALCIUM 9.9  MG --  PHOS --   CBC:  Lab 07/26/12 1125  WBC 8.8  NEUTROABS 4.5  HGB 14.1  HCT 40.8  MCV 90.9  PLT 383    Impression  1. Symptoms concerning for unstable angina, initial cardiac markers normal, however ECG with more prominent inferolateral ST segment depression. He is now stable without active chest pain on home medicines plus intravenous nitroglycerin and heparin. He reports compliance with Effient, has an aspirin allergy.  2. Known CAD status post anterior STEMI in August 2013, underwent thrombectomy with DES x2 to the proximal and mid LAD. Otherwise had nonobstructive disease, recent LVEF normal by echocardiogram in October.  3. Hypertension, blood pressure stable.  4  Hyperlipidemia, on statin therapy.   Recommendations  Discussed situation with the patient. Plan to continue current regimen, further cycle cardiac markers, and schedule for diagnostic cardiac catheterization on Monday, sooner if  he becomes clinically unstable. He is now pain-free. On nitroglycerin, heparin, Effient, Lopressor, lisinopril, and Pravachol.  Jonelle Sidle, M.D., F.A.C.C.

## 2012-07-27 DIAGNOSIS — I200 Unstable angina: Secondary | ICD-10-CM | POA: Diagnosis not present

## 2012-07-27 LAB — CBC
HCT: 36.7 % — ABNORMAL LOW (ref 39.0–52.0)
MCHC: 33.5 g/dL (ref 30.0–36.0)
MCV: 90.8 fL (ref 78.0–100.0)
Platelets: 317 10*3/uL (ref 150–400)
RDW: 14.9 % (ref 11.5–15.5)
WBC: 11 10*3/uL — ABNORMAL HIGH (ref 4.0–10.5)

## 2012-07-27 LAB — COMPREHENSIVE METABOLIC PANEL
ALT: 14 U/L (ref 0–53)
CO2: 29 mEq/L (ref 19–32)
Calcium: 9.2 mg/dL (ref 8.4–10.5)
Chloride: 104 mEq/L (ref 96–112)
Creatinine, Ser: 1 mg/dL (ref 0.50–1.35)
GFR calc Af Amer: 89 mL/min — ABNORMAL LOW (ref >90)
GFR calc non Af Amer: 76 mL/min — ABNORMAL LOW (ref >90)
Glucose, Bld: 109 mg/dL — ABNORMAL HIGH (ref 70–99)
Total Bilirubin: 0.3 mg/dL (ref 0.3–1.2)

## 2012-07-27 LAB — URINALYSIS, ROUTINE W REFLEX MICROSCOPIC
Hgb urine dipstick: NEGATIVE
Ketones, ur: NEGATIVE mg/dL
Protein, ur: NEGATIVE mg/dL
Urobilinogen, UA: 0.2 mg/dL (ref 0.0–1.0)

## 2012-07-27 LAB — HEPARIN LEVEL (UNFRACTIONATED): Heparin Unfractionated: 0.36 IU/mL (ref 0.30–0.70)

## 2012-07-27 LAB — TROPONIN I: Troponin I: <0.3 ng/mL (ref <0.30)

## 2012-07-27 MED ORDER — SODIUM CHLORIDE 0.9 % IJ SOLN: 3.0000 mL | Freq: Two times a day (BID) | INTRAMUSCULAR | Status: DC

## 2012-07-27 MED ORDER — SODIUM CHLORIDE 0.9 % IV SOLN: INTRAVENOUS | Status: DC

## 2012-07-27 MED ORDER — DIAZEPAM 5 MG PO TABS
5.0000 mg | ORAL_TABLET | ORAL | Status: AC
  Administered 2012-07-28: 5 mg via ORAL
  Filled 2012-07-27: qty 1

## 2012-07-27 MED ORDER — NITROGLYCERIN IN D5W 200-5 MCG/ML-% IV SOLN: 10.0000 ug/min | INTRAVENOUS | Status: DC

## 2012-07-27 NOTE — Progress Notes (Signed)
SUBJECTIVE:  No further chest pain.  No SOB   PHYSICAL EXAM Filed Vitals:   07/27/12 0500 07/27/12 0800 07/27/12 0808 07/27/12 0958  BP: 125/60 128/82  133/69  Pulse: 57 72  60  Temp:   98.5 F (36.9 C)   TempSrc:   Oral   Resp:      Height:  5\' 5"  (1.651 m)    Weight: 197 lb 8.5 oz (89.6 kg) 197 lb 8.5 oz (89.6 kg)    SpO2: 96% 97%     General:  No distress Lungs:  Clear Heart:  RRR Abdomen:  Positive bowel sounds, no rebound no guarding Extremities:  No edema Neuro:  Nonfocal  LABS: Lab Results  Component Value Date   TROPONINI <0.30 07/27/2012   Results for orders placed during the hospital encounter of 07/26/12 (from the past 24 hour(s))  CBC WITH DIFFERENTIAL     Status: Abnormal   Collection Time   07/26/12 11:25 AM      Component Value Range   WBC 8.8  4.0 - 10.5 K/uL   RBC 4.49  4.22 - 5.81 MIL/uL   Hemoglobin 14.1  13.0 - 17.0 g/dL   HCT 16.1  09.6 - 04.5 %   MCV 90.9  78.0 - 100.0 fL   MCH 31.4  26.0 - 34.0 pg   MCHC 34.6  30.0 - 36.0 g/dL   RDW 40.9  81.1 - 91.4 %   Platelets 383  150 - 400 K/uL   Neutrophils Relative 51  43 - 77 %   Neutro Abs 4.5  1.7 - 7.7 K/uL   Lymphocytes Relative 35  12 - 46 %   Lymphs Abs 3.1  0.7 - 4.0 K/uL   Monocytes Relative 9  3 - 12 %   Monocytes Absolute 0.8  0.1 - 1.0 K/uL   Eosinophils Relative 3  0 - 5 %   Eosinophils Absolute 0.3  0.0 - 0.7 K/uL   Basophils Relative 2 (*) 0 - 1 %   Basophils Absolute 0.2 (*) 0.0 - 0.1 K/uL  BASIC METABOLIC PANEL     Status: Abnormal   Collection Time   07/26/12 11:25 AM      Component Value Range   Sodium 142  135 - 145 mEq/L   Potassium 3.5  3.5 - 5.1 mEq/L   Chloride 103  96 - 112 mEq/L   CO2 27  19 - 32 mEq/L   Glucose, Bld 109 (*) 70 - 99 mg/dL   BUN 21  6 - 23 mg/dL   Creatinine, Ser 7.82  0.50 - 1.35 mg/dL   Calcium 9.9  8.4 - 95.6 mg/dL   GFR calc non Af Amer 65 (*) >90 mL/min   GFR calc Af Amer 75 (*) >90 mL/min  POCT I-STAT TROPONIN I     Status: Normal   Collection Time   07/26/12 11:25 AM      Component Value Range   Troponin i, poc 0.02  0.00 - 0.08 ng/mL   Comment 3           PROTIME-INR     Status: Normal   Collection Time   07/26/12 11:35 AM      Component Value Range   Prothrombin Time 13.2  11.6 - 15.2 seconds   INR 1.01  0.00 - 1.49  APTT     Status: Normal   Collection Time   07/26/12 11:35 AM      Component Value Range  aPTT 28  24 - 37 seconds  MRSA PCR SCREENING     Status: Normal   Collection Time   07/26/12  4:40 PM      Component Value Range   MRSA by PCR NEGATIVE  NEGATIVE  TROPONIN I     Status: Abnormal   Collection Time   07/26/12  5:50 PM      Component Value Range   Troponin I 0.35 (*) <0.30 ng/mL  HEPARIN LEVEL (UNFRACTIONATED)     Status: Abnormal   Collection Time   07/26/12  8:18 PM      Component Value Range   Heparin Unfractionated 0.29 (*) 0.30 - 0.70 IU/mL  TROPONIN I     Status: Abnormal   Collection Time   07/26/12 11:10 PM      Component Value Range   Troponin I 0.40 (*) <0.30 ng/mL  URINALYSIS, ROUTINE W REFLEX MICROSCOPIC     Status: Normal   Collection Time   07/27/12  4:16 AM      Component Value Range   Color, Urine YELLOW  YELLOW   APPearance CLEAR  CLEAR   Specific Gravity, Urine 1.018  1.005 - 1.030   pH 6.0  5.0 - 8.0   Glucose, UA NEGATIVE  NEGATIVE mg/dL   Hgb urine dipstick NEGATIVE  NEGATIVE   Bilirubin Urine NEGATIVE  NEGATIVE   Ketones, ur NEGATIVE  NEGATIVE mg/dL   Protein, ur NEGATIVE  NEGATIVE mg/dL   Urobilinogen, UA 0.2  0.0 - 1.0 mg/dL   Nitrite NEGATIVE  NEGATIVE   Leukocytes, UA NEGATIVE  NEGATIVE  COMPREHENSIVE METABOLIC PANEL     Status: Abnormal   Collection Time   07/27/12  5:01 AM      Component Value Range   Sodium 140  135 - 145 mEq/L   Potassium 4.1  3.5 - 5.1 mEq/L   Chloride 104  96 - 112 mEq/L   CO2 29  19 - 32 mEq/L   Glucose, Bld 109 (*) 70 - 99 mg/dL   BUN 18  6 - 23 mg/dL   Creatinine, Ser 1.61  0.50 - 1.35 mg/dL   Calcium 9.2  8.4 -  09.6 mg/dL   Total Protein 6.7  6.0 - 8.3 g/dL   Albumin 3.1 (*) 3.5 - 5.2 g/dL   AST 15  0 - 37 U/L   ALT 14  0 - 53 U/L   Alkaline Phosphatase 34 (*) 39 - 117 U/L   Total Bilirubin 0.3  0.3 - 1.2 mg/dL   GFR calc non Af Amer 76 (*) >90 mL/min   GFR calc Af Amer 89 (*) >90 mL/min  HEPARIN LEVEL (UNFRACTIONATED)     Status: Normal   Collection Time   07/27/12  5:01 AM      Component Value Range   Heparin Unfractionated 0.36  0.30 - 0.70 IU/mL  CBC     Status: Abnormal   Collection Time   07/27/12  5:01 AM      Component Value Range   WBC 11.0 (*) 4.0 - 10.5 K/uL   RBC 4.04 (*) 4.22 - 5.81 MIL/uL   Hemoglobin 12.3 (*) 13.0 - 17.0 g/dL   HCT 04.5 (*) 40.9 - 81.1 %   MCV 90.8  78.0 - 100.0 fL   MCH 30.4  26.0 - 34.0 pg   MCHC 33.5  30.0 - 36.0 g/dL   RDW 91.4  78.2 - 95.6 %   Platelets 317  150 - 400 K/uL  TROPONIN I     Status: Normal   Collection Time   07/27/12  5:10 AM      Component Value Range   Troponin I <0.30  <0.30 ng/mL    Intake/Output Summary (Last 24 hours) at 07/27/12 1106 Last data filed at 07/27/12 0810  Gross per 24 hour  Intake 1290.28 ml  Output   1350 ml  Net -59.72 ml    ASSESSMENT AND PLAN:  Unstable angina/NQWMI: Mild troponin elevation.  Cath tomorrow.  No ASA.   ASA allergy See above  HTN Controlled with meds as above.  Fayrene Fearing [facility] 07/27/2012 11:06 AM

## 2012-07-27 NOTE — Progress Notes (Signed)
ANTICOAGULATION CONSULT NOTE - Follow Up Consult  Pharmacy Consult for heparin Indication: chest pain/ACS  Allergies  Allergen Reactions  . Aspirin Anaphylaxis    Throat closes   Labs:  Basename 07/27/12 0510 07/27/12 0501 07/26/12 2310 07/26/12 2018 07/26/12 1750 07/26/12 1135 07/26/12 1125  HGB -- 12.3* -- -- -- -- 14.1  HCT -- 36.7* -- -- -- -- 40.8  PLT -- 317 -- -- -- -- 383  APTT -- -- -- -- -- 28 --  LABPROT -- -- -- -- -- 13.2 --  INR -- -- -- -- -- 1.01 --  HEPARINUNFRC -- 0.36 -- 0.29* -- -- --  CREATININE -- 1.00 -- -- -- -- 1.15  CKTOTAL -- -- -- -- -- -- --  CKMB -- -- -- -- -- -- --  TROPONINI <0.30 -- 0.40* -- 0.35* -- --    Estimated Creatinine Clearance: 74.7 ml/min (by C-G formula based on Cr of 1).  Assessment: 66 yo male with chest pain with plans for cath tomorrow Heparin level therapeutic  Goal of Therapy:  Heparin level 0.3-0.7 units/ml Monitor platelets by anticoagulation protocol: Yes   Plan:  1) Continue heparin at 1250 units / hr 2) Follow up AM labs  Thank you. Okey Regal, PharmD [phone]  07/27/2012,12:57 PM

## 2012-07-27 NOTE — Progress Notes (Signed)
RT Groin and rt Radial prepped as ordered. Pt tolerated procedure well.

## 2012-07-28 ENCOUNTER — Encounter (HOSPITAL_COMMUNITY): Payer: Federal, State, Local not specified - PPO

## 2012-07-28 ENCOUNTER — Encounter (HOSPITAL_COMMUNITY)
Admission: EM | Disposition: A | Discharge status: Home / Self Care | Payer: Self-pay | Source: Home / Self Care | Attending: Emergency Medicine

## 2012-07-28 ENCOUNTER — Encounter (HOSPITAL_COMMUNITY): Payer: Self-pay | Admitting: Physician Assistant

## 2012-07-28 DIAGNOSIS — I2109 ST elevation (STEMI) myocardial infarction involving other coronary artery of anterior wall: Secondary | ICD-10-CM | POA: Diagnosis not present

## 2012-07-28 DIAGNOSIS — I2510 Atherosclerotic heart disease of native coronary artery without angina pectoris: Secondary | ICD-10-CM

## 2012-07-28 DIAGNOSIS — Z888 Allergy status to other drugs, medicaments and biological substances status: Secondary | ICD-10-CM

## 2012-07-28 HISTORY — PX: LEFT HEART CATHETERIZATION WITH CORONARY ANGIOGRAM: SHX5451

## 2012-07-28 HISTORY — PX: CARDIAC CATHETERIZATION: SHX172

## 2012-07-28 LAB — CBC
MCH: 31.5 pg (ref 26.0–34.0)
MCV: 91 fL (ref 78.0–100.0)
Platelets: 317 10*3/uL (ref 150–400)
RBC: 3.9 MIL/uL — ABNORMAL LOW (ref 4.22–5.81)
RDW: 14.8 % (ref 11.5–15.5)

## 2012-07-28 SURGERY — LEFT HEART CATHETERIZATION WITH CORONARY ANGIOGRAM
Anesthesia: LOCAL

## 2012-07-28 MED ORDER — HEPARIN SODIUM (PORCINE) 1000 UNIT/ML IJ SOLN
INTRAMUSCULAR | Status: AC
  Filled 2012-07-28: qty 1

## 2012-07-28 MED ORDER — MIDAZOLAM HCL 2 MG/2ML IJ SOLN
INTRAMUSCULAR | Status: AC
  Filled 2012-07-28: qty 2

## 2012-07-28 MED ORDER — ISOSORBIDE MONONITRATE ER 30 MG PO TB24
30.0000 mg | ORAL_TABLET | Freq: Every day | ORAL | Status: DC
  Filled 2012-07-28: qty 1

## 2012-07-28 MED ORDER — FENTANYL CITRATE 0.05 MG/ML IJ SOLN
INTRAMUSCULAR | Status: AC
  Filled 2012-07-28: qty 2

## 2012-07-28 MED ORDER — SODIUM CHLORIDE 0.9 % IV SOLN: INTRAVENOUS | Status: DC

## 2012-07-28 MED ORDER — NITROGLYCERIN 0.2 MG/ML ON CALL CATH LAB
INTRAVENOUS | Status: AC
  Filled 2012-07-28: qty 1

## 2012-07-28 MED ORDER — NITROGLYCERIN 0.4 MG SL SUBL: 0.4000 mg | SUBLINGUAL_TABLET | SUBLINGUAL | Status: DC | PRN

## 2012-07-28 MED ORDER — LIDOCAINE HCL (PF) 1 % IJ SOLN
INTRAMUSCULAR | Status: AC
  Filled 2012-07-28: qty 30

## 2012-07-28 MED ORDER — ISOSORBIDE MONONITRATE ER 30 MG PO TB24: 30.0000 mg | ORAL_TABLET | Freq: Every day | ORAL | Status: DC

## 2012-07-28 MED ORDER — HEPARIN (PORCINE) IN NACL 2-0.9 UNIT/ML-% IJ SOLN
INTRAMUSCULAR | Status: AC
  Filled 2012-07-28: qty 1500

## 2012-07-28 NOTE — Discharge Summary (Signed)
Discharge Summary   Patient ID: Bradley Hurley,  MRN: [id number], DOB/AGE: November 17, 1945 66 y.o.  Admit date: 07/26/2012 Discharge date: 07/28/2012  Primary Physician: [name], MD Primary Cardiologist: Dietrich Pates, MD  Discharge Diagnoses Principal Problem:  *NSTEMI (non-ST elevated myocardial infarction)  - s/p cardiac cath 07/28/12 revealing nonobstructive CAD, patent LAD stents, unchanged mid LAD 25% ISR from 05/2012->medical management recommended  - Imdur added for antianginal benefit Active Problems:  HTN (hypertension)  CAD (coronary artery disease)  Hyperlipidemia  Aspirin allergy  - noted in history, continued on prasugrel   Allergies Allergies  Allergen Reactions  . Aspirin Anaphylaxis    Throat closes    Diagnostic Studies/Procedures  PORTABLE CHEST X-RAY - 07/26/12  IMPRESSION:  1. No acute cardiopulmonary disease.  2. Improved aeration.  3. Bilateral interstitial coarsening appears chronic.  CARDIAC CATHETERIZATION - 07/28/12  Hemodynamic Findings:  Central aortic pressure: 137/71  Left ventricular pressure: 155/13/19  Angiographic Findings:  Left main: Distal 20% stenosis.  Left Anterior Descending Artery: Large caliber vessel that courses to the apex and becomes smaller in caliber in the distal segment. There are patent stents in the proximal and mid vessel with no restenosis noted in the proximal segment. The stented segment in the mid vessel has 25% narrowing consistent with restenosis (of note, this is the area of stenting from 2003 and is unchanged in appearance from last cath in August 2013). The distal vessel is small in caliber and has serial 40% stenoses. The diagonal branch is moderate in caliber and has minimal plaque disease.  Circumflex Artery: Large caliber vessel that terminates into a marginal branch. The ostium of the vessel appears to have 30-40% stenosis. This does not appear to be flow limiting. There is a 40-50% stenosis in the  mid portion of the marginal branch.  Right Coronary Artery: Large dominant vessel with luminal irregularities in the proximal and mid segment. The distal vessel has serial 30% stenoses. The PDA is a moderate caliber vessel with mild diffuse plaque. The PLA is a moderate sized vessel with mild diffuse plaque.  Left Ventricular Angiogram: LVEF=55%. Hypokinesis of the apex.  Aortic root without evidence of dilatation or dissection.  Impression:  1. Double vessel CAD with patent stents in the proximal and mid LAD with diffuse small vessel disease in LAD and Circumflex  2. Preserved LV systolic function  History of Present Illness  [name] is a 66yo male who was transferred and admitted from [facility] hospital to [facility] hospital on 07/26/12 for symptoms c/w unstable angina.   He has a history cardiac history s/f anterior STEMI in 03/2012 s/p mid LAD thrombectomy w/ DES x 2 placed to proximal and mid segments. He had prior LAD stenting in 2003. He followed up with Dr. Tenny Craw in 05/2012 and was doing well. He was in his USOH and playing with his children the morning of admission when he suddenly stood and developed severe SSCP radiating to his neck, associated with diaphoresis, and was reminiscent of his MI symptoms from August. He presented to the ED where an EKG revealed more pronounced inferior ST depressions from baseline. He was given NTG with relief, and started on heparin. Initial cardiac markers were normal. Given his cardiac history and symptoms c/w unstable angina, the decision was made to transfer to [facility] for diagnostic cardiac catheterization the following Monday.  Hospital Course   He remained stable during this time. Cardiac biomarkers were cycled, and did return mildly elevated as noted below.  He was informed, consented and prepped for cardiac catheterization which revealed 20% distal left main, patent prox & mid LAD stents w/ 25% ISR of the latter (unchanged from 03/2012 cath,  mid-LAD stent placed in 2003), 30-40% ostial LCx, 40-50% distal marginal, 30% distal serial RCA stenoses; LVEF 55% with apical HK. He tolerated the procedure well without complications. RCA and left coronary system views were obtained with both R radial and R femoral approaches. The recommendation was to continue medical therapy. Imdur was added. He was deemed stable for discharge by Dr. Clifton James. He completed bed rest, ambulated without incident and the access sites remained stable without complications. He will follow-up in </= 7 days given NSTEMI status. He will resume OP cardiac rehab. This information, including post-cath instructions, has been clearly outlined in the discharge AVS.   Discharge Vitals:  Blood pressure 110/97, pulse 63, temperature 98.1 F (36.7 C), temperature source Oral, resp. rate 19, height 5\' 5"  (1.651 m), weight 89.5 kg (197 lb 5 oz), SpO2 98.00%.   Labs: Recent Labs  Basename 07/28/12 0500 07/27/12 0501   WBC 9.6 11.0*   HGB 12.3* 12.3*   HCT 35.5* 36.7*   MCV 91.0 90.8   PLT 317 317    Lab 07/27/12 0501 07/26/12 1125  NA 140 142  K 4.1 3.5  CL 104 103  CO2 29 27  BUN 18 21  CREATININE 1.00 1.15  CALCIUM 9.2 9.9  PROT 6.7 --  BILITOT 0.3 --  ALKPHOS 34* --  ALT 14 --  AST 15 --  AMYLASE -- --  LIPASE -- --  GLUCOSE 109* 109*   Recent Labs  Basename 07/27/12 0510 07/26/12 2310 07/26/12 1750   CKTOTAL -- -- --   CKMB -- -- --   CKMBINDEX -- -- --   TROPONINI <0.30 0.40* 0.35*   Disposition:  Discharge Orders    Future Appointments: Provider: Department: Dept Phone: Center:   07/30/2012 11:00 AM [facility] [place] [facility] [phone] [facility]   08/01/2012 11:00 AM [facility] [place] [facility] [phone] [facility]   08/01/2012 1:40 PM Jodelle Gross, NP [facility] [place] [phone] [facility]   08/04/2012 11:00 AM [facility] [place] [facility] [phone]  [facility]   08/06/2012 11:00 AM [facility] [place] [facility] [phone] [facility]   08/08/2012 11:00 AM [facility] [place] [facility] [phone] [facility]   08/11/2012 11:00 AM [facility] [place] [facility] [phone] [facility]   08/13/2012 11:00 AM [facility] 6 [place] [facility] [phone] [facility]   08/15/2012 11:00 AM [facility] [place] [facility] [phone] [facility]   09/19/2012 10:00 AM Pricilla Riffle, MD [facility] [place] [phone] [facility]     Follow-up Information    Follow up with Joni Reining, NP. On 08/01/2012. (At 1:40 PM for follow-up after this hospitalization. )    Contact information:   [address] [place] [address] [phone]         Discharge Medications:    Medication List     As of 07/28/2012  1:49 PM    START taking these medications         isosorbide mononitrate 30 MG 24 hr tablet   Commonly known as: IMDUR   Take 1 tablet (30 mg total) by mouth daily.      nitroGLYCERIN 0.4 MG SL tablet   Commonly known as: NITROSTAT   Place 1 tablet (0.4  mg total) under the tongue every 5 (five) minutes x 3 doses as needed for chest pain.      CONTINUE taking these medications         fluticasone 50 MCG/ACT nasal spray   Commonly known as: FLONASE      hydrochlorothiazide 25 MG tablet   Commonly known as: HYDRODIURIL   Take 0.5 tablets (12.5 mg total) by mouth daily.      lisinopril 10 MG tablet   Commonly known as: PRINIVIL,ZESTRIL   Take 1 tablet (10 mg total) by mouth daily.      LORazepam 1 MG tablet   Commonly known as: ATIVAN   Take 0.5 tablets (0.5 mg total) by mouth 3 (three) times daily as needed for anxiety.      metoprolol tartrate 25 MG tablet   Commonly known as: LOPRESSOR   Take 1 tablet (25 mg total) by mouth 2 (two) times daily.      prasugrel 10 MG Tabs   Commonly known as: EFFIENT   Take 1  tablet (10 mg total) by mouth daily.      pravastatin 40 MG tablet   Commonly known as: PRAVACHOL          Where to get your medications    These are the prescriptions that you need to pick up. We sent them to a specific pharmacy, so you will need to go there to get them.   [id number] - [place], [place] - [address]    [address]  [place] [address]    Phone: [phone]        isosorbide mononitrate 30 MG 24 hr tablet   nitroGLYCERIN 0.4 MG SL tablet           Outstanding Labs/Studies: None  Duration of Discharge Encounter: Greater than 30 minutes including physician time.  Signed, R. Hurman Horn, PA-C 07/28/2012, 1:49 PM

## 2012-07-28 NOTE — H&P (View-Only) (Signed)
 SUBJECTIVE:  No further chest pain.  No SOB   PHYSICAL EXAM Filed Vitals:   07/27/12 0500 07/27/12 0800 07/27/12 0808 07/27/12 0958  BP: 125/60 128/82  133/69  Pulse: 57 72  60  Temp:   98.5 F (36.9 C)   TempSrc:   Oral   Resp:      Height:  5' 5" (1.651 m)    Weight: 197 lb 8.5 oz (89.6 kg) 197 lb 8.5 oz (89.6 kg)    SpO2: 96% 97%     General:  No distress Lungs:  Clear Heart:  RRR Abdomen:  Positive bowel sounds, no rebound no guarding Extremities:  No edema Neuro:  Nonfocal  LABS: Lab Results  Component Value Date   TROPONINI <0.30 07/27/2012   Results for orders placed during the hospital encounter of 07/26/12 (from the past 24 hour(s))  CBC WITH DIFFERENTIAL     Status: Abnormal   Collection Time   07/26/12 11:25 AM      Component Value Range   WBC 8.8  4.0 - 10.5 K/uL   RBC 4.49  4.22 - 5.81 MIL/uL   Hemoglobin 14.1  13.0 - 17.0 g/dL   HCT 40.8  39.0 - 52.0 %   MCV 90.9  78.0 - 100.0 fL   MCH 31.4  26.0 - 34.0 pg   MCHC 34.6  30.0 - 36.0 g/dL   RDW 14.9  11.5 - 15.5 %   Platelets 383  150 - 400 K/uL   Neutrophils Relative 51  43 - 77 %   Neutro Abs 4.5  1.7 - 7.7 K/uL   Lymphocytes Relative 35  12 - 46 %   Lymphs Abs 3.1  0.7 - 4.0 K/uL   Monocytes Relative 9  3 - 12 %   Monocytes Absolute 0.8  0.1 - 1.0 K/uL   Eosinophils Relative 3  0 - 5 %   Eosinophils Absolute 0.3  0.0 - 0.7 K/uL   Basophils Relative 2 (*) 0 - 1 %   Basophils Absolute 0.2 (*) 0.0 - 0.1 K/uL  BASIC METABOLIC PANEL     Status: Abnormal   Collection Time   07/26/12 11:25 AM      Component Value Range   Sodium 142  135 - 145 mEq/L   Potassium 3.5  3.5 - 5.1 mEq/L   Chloride 103  96 - 112 mEq/L   CO2 27  19 - 32 mEq/L   Glucose, Bld 109 (*) 70 - 99 mg/dL   BUN 21  6 - 23 mg/dL   Creatinine, Ser 1.15  0.50 - 1.35 mg/dL   Calcium 9.9  8.4 - 10.5 mg/dL   GFR calc non Af Amer 65 (*) >90 mL/min   GFR calc Af Amer 75 (*) >90 mL/min  POCT I-STAT TROPONIN I     Status: Normal   Collection Time   07/26/12 11:25 AM      Component Value Range   Troponin i, poc 0.02  0.00 - 0.08 ng/mL   Comment 3           PROTIME-INR     Status: Normal   Collection Time   07/26/12 11:35 AM      Component Value Range   Prothrombin Time 13.2  11.6 - 15.2 seconds   INR 1.01  0.00 - 1.49  APTT     Status: Normal   Collection Time   07/26/12 11:35 AM      Component Value Range     aPTT 28  24 - 37 seconds  MRSA PCR SCREENING     Status: Normal   Collection Time   07/26/12  4:40 PM      Component Value Range   MRSA by PCR NEGATIVE  NEGATIVE  TROPONIN I     Status: Abnormal   Collection Time   07/26/12  5:50 PM      Component Value Range   Troponin I 0.35 (*) <0.30 ng/mL  HEPARIN LEVEL (UNFRACTIONATED)     Status: Abnormal   Collection Time   07/26/12  8:18 PM      Component Value Range   Heparin Unfractionated 0.29 (*) 0.30 - 0.70 IU/mL  TROPONIN I     Status: Abnormal   Collection Time   07/26/12 11:10 PM      Component Value Range   Troponin I 0.40 (*) <0.30 ng/mL  URINALYSIS, ROUTINE W REFLEX MICROSCOPIC     Status: Normal   Collection Time   07/27/12  4:16 AM      Component Value Range   Color, Urine YELLOW  YELLOW   APPearance CLEAR  CLEAR   Specific Gravity, Urine 1.018  1.005 - 1.030   pH 6.0  5.0 - 8.0   Glucose, UA NEGATIVE  NEGATIVE mg/dL   Hgb urine dipstick NEGATIVE  NEGATIVE   Bilirubin Urine NEGATIVE  NEGATIVE   Ketones, ur NEGATIVE  NEGATIVE mg/dL   Protein, ur NEGATIVE  NEGATIVE mg/dL   Urobilinogen, UA 0.2  0.0 - 1.0 mg/dL   Nitrite NEGATIVE  NEGATIVE   Leukocytes, UA NEGATIVE  NEGATIVE  COMPREHENSIVE METABOLIC PANEL     Status: Abnormal   Collection Time   07/27/12  5:01 AM      Component Value Range   Sodium 140  135 - 145 mEq/L   Potassium 4.1  3.5 - 5.1 mEq/L   Chloride 104  96 - 112 mEq/L   CO2 29  19 - 32 mEq/L   Glucose, Bld 109 (*) 70 - 99 mg/dL   BUN 18  6 - 23 mg/dL   Creatinine, Ser 1.00  0.50 - 1.35 mg/dL   Calcium 9.2  8.4 -  10.5 mg/dL   Total Protein 6.7  6.0 - 8.3 g/dL   Albumin 3.1 (*) 3.5 - 5.2 g/dL   AST 15  0 - 37 U/L   ALT 14  0 - 53 U/L   Alkaline Phosphatase 34 (*) 39 - 117 U/L   Total Bilirubin 0.3  0.3 - 1.2 mg/dL   GFR calc non Af Amer 76 (*) >90 mL/min   GFR calc Af Amer 89 (*) >90 mL/min  HEPARIN LEVEL (UNFRACTIONATED)     Status: Normal   Collection Time   07/27/12  5:01 AM      Component Value Range   Heparin Unfractionated 0.36  0.30 - 0.70 IU/mL  CBC     Status: Abnormal   Collection Time   07/27/12  5:01 AM      Component Value Range   WBC 11.0 (*) 4.0 - 10.5 K/uL   RBC 4.04 (*) 4.22 - 5.81 MIL/uL   Hemoglobin 12.3 (*) 13.0 - 17.0 g/dL   HCT 36.7 (*) 39.0 - 52.0 %   MCV 90.8  78.0 - 100.0 fL   MCH 30.4  26.0 - 34.0 pg   MCHC 33.5  30.0 - 36.0 g/dL   RDW 14.9  11.5 - 15.5 %   Platelets 317  150 - 400 K/uL    TROPONIN I     Status: Normal   Collection Time   07/27/12  5:10 AM      Component Value Range   Troponin I <0.30  <0.30 ng/mL    Intake/Output Summary (Last 24 hours) at 07/27/12 1106 Last data filed at 07/27/12 0810  Gross per 24 hour  Intake 1290.28 ml  Output   1350 ml  Net -59.72 ml    ASSESSMENT AND PLAN:  Unstable angina/NQWMI: Mild troponin elevation.  Cath tomorrow.  No ASA.   ASA allergy See above  HTN Controlled with meds as above.  Bradley Hurley 07/27/2012 11:06 AM   

## 2012-07-28 NOTE — Progress Notes (Signed)
Utilization Review Completed.   Geo Slone, RN, BSN Nurse Case Manager  [phone]  

## 2012-07-28 NOTE — Discharge Summary (Signed)
See cath note. cdm 

## 2012-07-28 NOTE — CV Procedure (Addendum)
Cardiac Catheterization Operative Report  Bradley Hurley [id number] 12/2/20138:35 AM [name], MD  Procedure Performed:  1. Left Heart Catheterization 2. Selective Coronary Angiography 3. Left ventricular angiogram 4. Aortic root angiogram  Operator: Verne Carrow, MD  Arterial access site:  Right radial artery and right femoral artery.   Indication:  66 yo male with history of CAD with remote stenting of LAD in 2003 and then anterior STEMI August 2013 with two overlapping drug eluting stents placed in the proximal and mid LAD admitted after a single episode of chest pain, mild troponin elevation at 0.40 on admission with normal troponin on last check yesterday.                                   Procedure Details: The risks, benefits, complications, treatment options, and expected outcomes were discussed with the patient. The patient and/or family concurred with the proposed plan, giving informed consent. The patient was brought to the cath lab after IV hydration was begun and oral premedication was given. The patient was further sedated with Versed and Fentanyl. The right wrist was assessed with an Allens test which was positive. The right wrist was prepped and draped in a sterile fashion. 1% lidocaine was used for local anesthesia. Using the modified Seldinger access technique, a 5 French sheath was placed in the right radial artery. 3 mg Verapamil was given through the sheath. 4500 units IV heparin was given. I was able to engage the RCA with a JR4 catheter. Selective angiography of the RCA was performed. I was not able to engage the left main from the radial approach after using multiple catheters. I did not feel that it was safe to continue trying from the right radial artery so I chose to switch to a right femoral artery approach. The right groin was prepped and draped in a sterile fashion. 1% lidocaine was used for local anesthesia. A 5 French sheath was placed in the  right femoral artery. There was still difficulty engaging the left main with standard catheters. A pigtail catheter was used to perform a left ventricular angiogram and an aortic root shot to make sure there was no dissection in the aorta.  I then engaged the left main artery with a EBU 3.0 guiding catheter. The sheath was removed from the right radial artery and a Terumo hemostasis band was applied at the arteriotomy site on the right wrist.    There were no immediate complications. The patient was taken to the recovery area in stable condition.   Hemodynamic Findings: Central aortic pressure: 137/71 Left ventricular pressure: 155/13/19  Angiographic Findings:  Left main:  Distal 20% stenosis.   Left Anterior Descending Artery: Large caliber vessel that courses to the apex and becomes smaller in caliber in the distal segment. There are patent stents in the proximal and mid vessel with no restenosis noted in the proximal segment. The stented segment in the mid vessel has 25% narrowing consistent with restenosis (of note, this is the area of stenting from 2003 and is unchanged in appearance from last cath in August 2013). The distal vessel is small in caliber and has serial 40% stenoses. The diagonal branch is moderate in caliber and has minimal plaque disease.   Circumflex Artery: Large caliber vessel that terminates into a marginal branch. The ostium of the vessel appears to have 30-40% stenosis. This does not appear to be flow limiting. There  is a 40-50% stenosis in the mid portion of the marginal branch.   Right Coronary Artery: Large dominant vessel with luminal irregularities in the proximal and mid segment. The distal vessel has serial 30% stenoses. The PDA is a moderate caliber vessel with mild diffuse plaque. The PLA is a moderate sized vessel with mild diffuse plaque.   Left Ventricular Angiogram: LVEF=55%. Hypokinesis of the apex.   Aortic root without evidence of dilatation or  dissection.   Impression: 1. Double vessel CAD with patent stents in the proximal and mid LAD with diffuse small vessel disease in LAD and Circumflex 2. Preserved LV systolic function  Recommendations: Will continue medical management. Will add Imdur. D/C home later today.        Complications:  None. The patient tolerated the procedure well.

## 2012-07-28 NOTE — Interval H&P Note (Signed)
History and Physical Interval Note:  07/28/2012 7:45 AM  Bradley Hurley  has presented today for cardiac cath with the diagnosis of cp  The various methods of treatment have been discussed with the patient and family. After consideration of risks, benefits and other options for treatment, the patient has consented to  Procedure(s) (LRB) with comments: LEFT HEART CATHETERIZATION WITH CORONARY ANGIOGRAM (N/A) as a surgical intervention .  The patient's history has been reviewed, patient examined, no change in status, stable for surgery.  I have reviewed the patient's chart and labs.  Questions were answered to the patient's satisfaction.     [name]

## 2012-07-30 ENCOUNTER — Other Ambulatory Visit: Payer: Self-pay | Admitting: *Deleted

## 2012-07-30 ENCOUNTER — Encounter (HOSPITAL_COMMUNITY): Payer: Federal, State, Local not specified - PPO

## 2012-07-30 MED ORDER — NITROGLYCERIN 0.4 MG SL SUBL: 0.4000 mg | SUBLINGUAL_TABLET | SUBLINGUAL | Status: DC | PRN

## 2012-07-30 MED ORDER — ISOSORBIDE MONONITRATE ER 30 MG PO TB24: 30.0000 mg | ORAL_TABLET | Freq: Every day | ORAL | Status: DC

## 2012-08-01 ENCOUNTER — Encounter: Payer: Self-pay | Admitting: Adult Health

## 2012-08-01 ENCOUNTER — Encounter (HOSPITAL_COMMUNITY): Payer: Federal, State, Local not specified - PPO

## 2012-08-01 ENCOUNTER — Ambulatory Visit (INDEPENDENT_AMBULATORY_CARE_PROVIDER_SITE_OTHER): Payer: Federal, State, Local not specified - PPO | Admitting: Adult Health

## 2012-08-01 VITALS — BP 116/76 | HR 67 | Ht 65.0 in | Wt 197.1 lb

## 2012-08-01 DIAGNOSIS — I10 Essential (primary) hypertension: Secondary | ICD-10-CM | POA: Diagnosis not present

## 2012-08-01 DIAGNOSIS — I219 Acute myocardial infarction, unspecified: Secondary | ICD-10-CM

## 2012-08-01 DIAGNOSIS — E785 Hyperlipidemia, unspecified: Secondary | ICD-10-CM

## 2012-08-01 DIAGNOSIS — I213 ST elevation (STEMI) myocardial infarction of unspecified site: Secondary | ICD-10-CM

## 2012-08-01 DIAGNOSIS — I200 Unstable angina: Secondary | ICD-10-CM | POA: Diagnosis not present

## 2012-08-01 NOTE — Assessment & Plan Note (Signed)
Bradley Hurley will continue with medical management of his CAD. Cardiac cath completed in 06/2012 did not show any ISR. He was placed on long acting nitrates for angina. He will continue cardiac rehab and low cholesterol diet. Would like to see him lose about 20 lbs and increase his activity. He verbalizes understanding.  I have answered multiple questions concerning his DES and need to take Effient.

## 2012-08-01 NOTE — Progress Notes (Signed)
HPI: Bradley Hurley is a pleasant 66 y/o male patient of Dr.Paula Tenny Craw we are following for ongoing assessment and treatment of CAD with STEMI in 03/2012 s/p mid LAD thrombectomy with DES X 2 placed to proximal and mid segments. He was seen again at [facility] hospital after admission to [facility] ER with recurrent chest pain on 07/26/2012. He was diagnosed with NSTEMI with mild increase in cardiac enzymes. Cardiac catheterization was completed by Dr. Clifton James which revealed 20% distal LM, patent prox and mid LAD stents, with 25% ISR of the later, (unchanged since 03/2012), 30%-40% ostial LCx, 40-50% distal marginal, 30% distal serial RCA stenosis, with LVEF of 55% with apical hypokinesis. Recommendation to continue medical therapy and addition of isosorbide mono 30 mg was given. He was to stay on Effient and pravastatin 40 mg. He continues in cardiac rehab. His only complaint today is fatigue and leg weakness. He states that it happened that last time he was studied. He states that it is getting better. He is trying to stay on a low cholesterol diet.   Allergies  Allergen Reactions  . Aspirin Anaphylaxis    Throat closes    Current Outpatient Prescriptions  Medication Sig Dispense Refill  . fluticasone (FLONASE) 50 MCG/ACT nasal spray Place 2 sprays into the nose daily as needed. For allergies      . hydrochlorothiazide (HYDRODIURIL) 25 MG tablet Take 0.5 tablets (12.5 mg total) by mouth daily.  30 tablet  11  . lisinopril (PRINIVIL,ZESTRIL) 10 MG tablet Take 1 tablet (10 mg total) by mouth daily.  30 tablet  11  . LORazepam (ATIVAN) 1 MG tablet Take 0.5 tablets (0.5 mg total) by mouth 3 (three) times daily as needed for anxiety.  15 tablet  0  . metoprolol tartrate (LOPRESSOR) 25 MG tablet Take 1 tablet (25 mg total) by mouth 2 (two) times daily.  60 tablet  10  . nitroGLYCERIN (NITROSTAT) 0.4 MG SL tablet Place 1 tablet (0.4 mg total) under the tongue every 5 (five) minutes as needed for chest pain.  50 tablet  6   . prasugrel (EFFIENT) 10 MG TABS Take 1 tablet (10 mg total) by mouth daily.  30 tablet  11  . pravastatin (PRAVACHOL) 40 MG tablet Take 40 mg by mouth daily.      . isosorbide mononitrate (IMDUR) 30 MG 24 hr tablet Take 1 tablet (30 mg total) by mouth daily.  30 tablet  3    Past Medical History  Diagnosis Date  . Coronary atherosclerosis of native coronary artery 07/2002    Previous patient of [facility]; PCI  2003-90% septal perforator, 90% proximal and mid LAD->DES x2, normal CX, luminal irregularities in RCA, normal EF  . Essential hypertension, benign   . Hyperlipidemia     Lipid profile in 03/2012:182, 161,09,604  . Obstructive sleep apnea     [facility] - 05/2011  . Tinnitus   . Erectile dysfunction     Low serum testosterone level  . Depression   . Obesity   . Deviated septum   . 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 MI (myocardial infarction)     Anterior STEMI 8/13  . CAD (coronary artery disease)     Past Surgical History  Procedure Date  . Splenectomy   . Appendectomy   . Cardiac catheterization 07/28/2012    20% distal left main, patent prox & mid LAD stents w/ 25% ISR of the latter (  unchanged from 03/2012 cath, mid-LAD stent placed in 2003), 30-40% ostial LCx, 40-50% distal marginal, 30% distal serial RCA stenoses; LVEF 55% with apical HK  . Coronary angioplasty with stent placement 2003    mid LAD  . Coronary angioplasty with stent placement 03/2012    STEMI s/p DES-prox LAD    ZOX:WRUEAV of systems complete and found to be negative unless listed above PHYSICAL EXAM BP 116/76  Pulse 67  Ht 5\' 5"  (1.651 m)  Wt 197 lb 1.9 oz (89.413 kg)  BMI 32.80 kg/m2  General: Well developed, well nourished, in no acute distress Head: Eyes PERRLA, No xanthomas.   Normal cephalic and atramatic  Lungs: Clear bilaterally to auscultation and percussion. Heart: HRRR S1 S2, without MRG.  Pulses are 2+ & equal.            No carotid bruit. No  JVD.  No abdominal bruits. No femoral bruits. Abdomen: Bowel sounds are positive, abdomen soft and non-tender without masses or                  Hernia's noted.Obese. Msk:  Back normal, normal gait. Normal strength and tone for age. Extremities: No clubbing, cyanosis or edema.  DP +1 no evidence of claudication. Neuro: Alert and oriented X 3. Psych:  Good affect, responds appropriately  EKG:NSR with low voltage. Rate of 67 bpm, anterior infarct.  ASSESSMENT AND PLAN

## 2012-08-01 NOTE — Patient Instructions (Addendum)
Your physician recommends that you schedule a follow-up appointment in: 09-19-12 AT 10AM WITH ROSS (APT ALREADY SCHEDULED)  Your physician recommends that you return for lab work in: NEXT WEEK (LIPIDS/LFT) YOU WILL NEED TO BE FASTING, SLIPS HAVE BEEN PROVIDED TO YOU WITH YOUR OFFICE VISIT TODAY

## 2012-08-01 NOTE — Assessment & Plan Note (Signed)
He has yet to pick up Rx for isosorbide mono 30 mg. His pharmacy had to order it. He is to pick it up today. He will see Dr.Ross in January. I have warned him that he may have a headache with the use of nitrates. He verbalizes understanding.

## 2012-08-01 NOTE — Assessment & Plan Note (Signed)
Will check fasting lipids and LFT's as he has not had this completed since August STEMI. I have discussed low cholesterol diet.

## 2012-08-01 NOTE — Progress Notes (Deleted)
Name: Bradley Hurley    DOB: [birth year]  Age: 66 y.o.  MR#: [id number]       PCP:  [name], MD      Insurance: @PAYORNAME @   CC:   No chief complaint on file.   VS BP 116/76  Pulse 67  Ht 5\' 5"  (1.651 m)  Wt 197 lb 1.9 oz (89.413 kg)  BMI 32.80 kg/m2  Weights Current Weight  08/01/12 197 lb 1.9 oz (89.413 kg)  07/28/12 197 lb 5 oz (89.5 kg)  07/28/12 197 lb 5 oz (89.5 kg)    Blood Pressure  BP Readings from Last 3 Encounters:  08/01/12 116/76  07/28/12 140/79  07/28/12 140/79     Admit date:  (Not on file) Last encounter with RMR:  Visit date not found   Allergy Allergies  Allergen Reactions  . Aspirin Anaphylaxis    Throat closes    Current Outpatient Prescriptions  Medication Sig Dispense Refill  . fluticasone (FLONASE) 50 MCG/ACT nasal spray Place 2 sprays into the nose daily as needed. For allergies      . hydrochlorothiazide (HYDRODIURIL) 25 MG tablet Take 0.5 tablets (12.5 mg total) by mouth daily.  30 tablet  11  . lisinopril (PRINIVIL,ZESTRIL) 10 MG tablet Take 1 tablet (10 mg total) by mouth daily.  30 tablet  11  . LORazepam (ATIVAN) 1 MG tablet Take 0.5 tablets (0.5 mg total) by mouth 3 (three) times daily as needed for anxiety.  15 tablet  0  . metoprolol tartrate (LOPRESSOR) 25 MG tablet Take 1 tablet (25 mg total) by mouth 2 (two) times daily.  60 tablet  10  . nitroGLYCERIN (NITROSTAT) 0.4 MG SL tablet Place 1 tablet (0.4 mg total) under the tongue every 5 (five) minutes as needed for chest pain.  50 tablet  6  . prasugrel (EFFIENT) 10 MG TABS Take 1 tablet (10 mg total) by mouth daily.  30 tablet  11  . pravastatin (PRAVACHOL) 40 MG tablet Take 40 mg by mouth daily.      . isosorbide mononitrate (IMDUR) 30 MG 24 hr tablet Take 1 tablet (30 mg total) by mouth daily.  30 tablet  3    Discontinued Meds:   There are no discontinued medications.  Patient Active Problem List  Diagnosis  . STEMI (ST elevation myocardial infarction)  . HTN  (hypertension)  . Hypokalemia  . CAD (coronary artery disease)  . Hyperlipidemia  . Unstable angina  . NSTEMI (non-ST elevated myocardial infarction)  . Aspirin allergy    LABS Admission on 07/26/2012, Discharged on 07/28/2012  Component Date Value  . WBC 07/26/2012 8.8   . RBC 07/26/2012 4.49   . Hemoglobin 07/26/2012 14.1   . HCT 07/26/2012 40.8   . MCV 07/26/2012 90.9   . [facility] 07/26/2012 31.4   . MCHC 07/26/2012 34.6   . RDW 07/26/2012 14.9   . Platelets 07/26/2012 383   . Neutrophils Relative 07/26/2012 51   . Neutro Abs 07/26/2012 4.5   . Lymphocytes Relative 07/26/2012 35   . Lymphs Abs 07/26/2012 3.1   . Monocytes Relative 07/26/2012 9   . Monocytes Absolute 07/26/2012 0.8   . Eosinophils Relative 07/26/2012 3   . Eosinophils Absolute 07/26/2012 0.3   . Basophils Relative 07/26/2012 2*  . Basophils Absolute 07/26/2012 0.2*  . Sodium 07/26/2012 142   . Potassium 07/26/2012 3.5   . Chloride 07/26/2012 103   . CO2 07/26/2012 27   . Glucose, Bld 07/26/2012 109*  .  BUN 07/26/2012 21   . Creatinine, Ser 07/26/2012 1.15   . Calcium 07/26/2012 9.9   . GFR calc non Af Amer 07/26/2012 65*  . GFR calc Af Amer 07/26/2012 75*  . Troponin i, poc 07/26/2012 0.02   . Comment 3 07/26/2012          . Prothrombin Time 07/26/2012 13.2   . INR 07/26/2012 1.01   . aPTT 07/26/2012 28   . Color, Urine 07/27/2012 YELLOW   . APPearance 07/27/2012 CLEAR   . Specific Gravity, Urine 07/27/2012 1.018   . pH 07/27/2012 6.0   . Glucose, UA 07/27/2012 NEGATIVE   . Hgb urine dipstick 07/27/2012 NEGATIVE   . Bilirubin Urine 07/27/2012 NEGATIVE   . Ketones, ur 07/27/2012 NEGATIVE   . Protein, ur 07/27/2012 NEGATIVE   . Urobilinogen, UA 07/27/2012 0.2   . Nitrite 07/27/2012 NEGATIVE   . Leukocytes, UA 07/27/2012 NEGATIVE   . Troponin I 07/26/2012 0.35*  . Troponin I 07/26/2012 0.40*  . Troponin I 07/27/2012 <0.30   . MRSA by PCR 07/26/2012 NEGATIVE   . Sodium 07/27/2012 140   .  Potassium 07/27/2012 4.1   . Chloride 07/27/2012 104   . CO2 07/27/2012 29   . Glucose, Bld 07/27/2012 109*  . BUN 07/27/2012 18   . Creatinine, Ser 07/27/2012 1.00   . Calcium 07/27/2012 9.2   . Total Protein 07/27/2012 6.7   . Albumin 07/27/2012 3.1*  . AST 07/27/2012 15   . ALT 07/27/2012 14   . Alkaline Phosphatase 07/27/2012 34*  . Total Bilirubin 07/27/2012 0.3   . GFR calc non Af Amer 07/27/2012 76*  . GFR calc Af Amer 07/27/2012 89*  . Heparin Unfractionated 07/26/2012 0.29*  . Heparin Unfractionated 07/27/2012 0.36   . WBC 07/27/2012 11.0*  . RBC 07/27/2012 4.04*  . Hemoglobin 07/27/2012 12.3*  . HCT 07/27/2012 36.7*  . MCV 07/27/2012 90.8   . [facility] 07/27/2012 30.4   . MCHC 07/27/2012 33.5   . RDW 07/27/2012 14.9   . Platelets 07/27/2012 317   . Heparin Unfractionated 07/28/2012 0.21*  . WBC 07/28/2012 9.6   . RBC 07/28/2012 3.90*  . Hemoglobin 07/28/2012 12.3*  . HCT 07/28/2012 35.5*  . MCV 07/28/2012 91.0   . [facility] 07/28/2012 31.5   . MCHC 07/28/2012 34.6   . RDW 07/28/2012 14.8   . Platelets 07/28/2012 317   . Activated Clotting Time 07/28/2012 185   . Activated Clotting Time 07/28/2012 175   Admission on 06/06/2012, Discharged on 06/06/2012  Component Date Value  . WBC 06/06/2012 8.3   . RBC 06/06/2012 4.20*  . Hemoglobin 06/06/2012 13.0   . HCT 06/06/2012 37.4*  . MCV 06/06/2012 89.0   . [facility] 06/06/2012 31.0   . MCHC 06/06/2012 34.8   . RDW 06/06/2012 14.6   . Platelets 06/06/2012 348   . Neutrophils Relative 06/06/2012 61   . Neutro Abs 06/06/2012 5.1   . Lymphocytes Relative 06/06/2012 26   . Lymphs Abs 06/06/2012 2.1   . Monocytes Relative 06/06/2012 10   . Monocytes Absolute 06/06/2012 0.8   . Eosinophils Relative 06/06/2012 3   . Eosinophils Absolute 06/06/2012 0.2   . Basophils Relative 06/06/2012 1   . Basophils Absolute 06/06/2012 0.1   . Sodium 06/06/2012 139   . Potassium 06/06/2012 3.6   . Chloride 06/06/2012 102   . CO2 06/06/2012 27    . Glucose, Bld 06/06/2012 94   . BUN 06/06/2012 27*  . Creatinine, Ser 06/06/2012 1.24   .  Calcium 06/06/2012 10.0   . GFR calc non Af Amer 06/06/2012 59*  . GFR calc Af Amer 06/06/2012 68*  . Color, Urine 06/06/2012 YELLOW   . APPearance 06/06/2012 CLEAR   . Specific Gravity, Urine 06/06/2012 1.015   . pH 06/06/2012 6.0   . Glucose, UA 06/06/2012 NEGATIVE   . Hgb urine dipstick 06/06/2012 TRACE*  . Bilirubin Urine 06/06/2012 NEGATIVE   . Ketones, ur 06/06/2012 NEGATIVE   . Protein, ur 06/06/2012 NEGATIVE   . Urobilinogen, UA 06/06/2012 0.2   . Nitrite 06/06/2012 NEGATIVE   . Leukocytes, UA 06/06/2012 NEGATIVE   . Troponin i, poc 06/06/2012 0.02   . Comment 3 06/06/2012          . Squamous Epithelial / LPF 06/06/2012 RARE   . RBC / HPF 06/06/2012 0-2   . Bacteria, UA 06/06/2012 RARE      Results for this Opt Visit:     Results for orders placed during the hospital encounter of 07/26/12  CBC WITH DIFFERENTIAL      Component Value Range   WBC 8.8  4.0 - 10.5 K/uL   RBC 4.49  4.22 - 5.81 MIL/uL   Hemoglobin 14.1  13.0 - 17.0 g/dL   HCT 16.1  09.6 - 04.5 %   MCV 90.9  78.0 - 100.0 fL   MCH 31.4  26.0 - 34.0 pg   MCHC 34.6  30.0 - 36.0 g/dL   RDW 40.9  81.1 - 91.4 %   Platelets 383  150 - 400 K/uL   Neutrophils Relative 51  43 - 77 %   Neutro Abs 4.5  1.7 - 7.7 K/uL   Lymphocytes Relative 35  12 - 46 %   Lymphs Abs 3.1  0.7 - 4.0 K/uL   Monocytes Relative 9  3 - 12 %   Monocytes Absolute 0.8  0.1 - 1.0 K/uL   Eosinophils Relative 3  0 - 5 %   Eosinophils Absolute 0.3  0.0 - 0.7 K/uL   Basophils Relative 2 (*) 0 - 1 %   Basophils Absolute 0.2 (*) 0.0 - 0.1 K/uL  BASIC METABOLIC PANEL      Component Value Range   Sodium 142  135 - 145 mEq/L   Potassium 3.5  3.5 - 5.1 mEq/L   Chloride 103  96 - 112 mEq/L   CO2 27  19 - 32 mEq/L   Glucose, Bld 109 (*) 70 - 99 mg/dL   BUN 21  6 - 23 mg/dL   Creatinine, Ser 7.82  0.50 - 1.35 mg/dL   Calcium 9.9  8.4 - 95.6 mg/dL    GFR calc non Af Amer 65 (*) >90 mL/min   GFR calc Af Amer 75 (*) >90 mL/min  POCT I-STAT TROPONIN I      Component Value Range   Troponin i, poc 0.02  0.00 - 0.08 ng/mL   Comment 3           PROTIME-INR      Component Value Range   Prothrombin Time 13.2  11.6 - 15.2 seconds   INR 1.01  0.00 - 1.49  APTT      Component Value Range   aPTT 28  24 - 37 seconds  URINALYSIS, ROUTINE W REFLEX MICROSCOPIC      Component Value Range   Color, Urine YELLOW  YELLOW   APPearance CLEAR  CLEAR   Specific Gravity, Urine 1.018  1.005 - 1.030   pH 6.0  5.0 -  8.0   Glucose, UA NEGATIVE  NEGATIVE mg/dL   Hgb urine dipstick NEGATIVE  NEGATIVE   Bilirubin Urine NEGATIVE  NEGATIVE   Ketones, ur NEGATIVE  NEGATIVE mg/dL   Protein, ur NEGATIVE  NEGATIVE mg/dL   Urobilinogen, UA 0.2  0.0 - 1.0 mg/dL   Nitrite NEGATIVE  NEGATIVE   Leukocytes, UA NEGATIVE  NEGATIVE  TROPONIN I      Component Value Range   Troponin I 0.35 (*) <0.30 ng/mL  TROPONIN I      Component Value Range   Troponin I 0.40 (*) <0.30 ng/mL  TROPONIN I      Component Value Range   Troponin I <0.30  <0.30 ng/mL  MRSA PCR SCREENING      Component Value Range   MRSA by PCR NEGATIVE  NEGATIVE  COMPREHENSIVE METABOLIC PANEL      Component Value Range   Sodium 140  135 - 145 mEq/L   Potassium 4.1  3.5 - 5.1 mEq/L   Chloride 104  96 - 112 mEq/L   CO2 29  19 - 32 mEq/L   Glucose, Bld 109 (*) 70 - 99 mg/dL   BUN 18  6 - 23 mg/dL   Creatinine, Ser 4.78  0.50 - 1.35 mg/dL   Calcium 9.2  8.4 - 29.5 mg/dL   Total Protein 6.7  6.0 - 8.3 g/dL   Albumin 3.1 (*) 3.5 - 5.2 g/dL   AST 15  0 - 37 U/L   ALT 14  0 - 53 U/L   Alkaline Phosphatase 34 (*) 39 - 117 U/L   Total Bilirubin 0.3  0.3 - 1.2 mg/dL   GFR calc non Af Amer 76 (*) >90 mL/min   GFR calc Af Amer 89 (*) >90 mL/min  HEPARIN LEVEL (UNFRACTIONATED)      Component Value Range   Heparin Unfractionated 0.29 (*) 0.30 - 0.70 IU/mL  HEPARIN LEVEL (UNFRACTIONATED)      Component  Value Range   Heparin Unfractionated 0.36  0.30 - 0.70 IU/mL  CBC      Component Value Range   WBC 11.0 (*) 4.0 - 10.5 K/uL   RBC 4.04 (*) 4.22 - 5.81 MIL/uL   Hemoglobin 12.3 (*) 13.0 - 17.0 g/dL   HCT 62.1 (*) 30.8 - 65.7 %   MCV 90.8  78.0 - 100.0 fL   MCH 30.4  26.0 - 34.0 pg   MCHC 33.5  30.0 - 36.0 g/dL   RDW 84.6  96.2 - 95.2 %   Platelets 317  150 - 400 K/uL  HEPARIN LEVEL (UNFRACTIONATED)      Component Value Range   Heparin Unfractionated 0.21 (*) 0.30 - 0.70 IU/mL  CBC      Component Value Range   WBC 9.6  4.0 - 10.5 K/uL   RBC 3.90 (*) 4.22 - 5.81 MIL/uL   Hemoglobin 12.3 (*) 13.0 - 17.0 g/dL   HCT 84.1 (*) 32.4 - 40.1 %   MCV 91.0  78.0 - 100.0 fL   MCH 31.5  26.0 - 34.0 pg   MCHC 34.6  30.0 - 36.0 g/dL   RDW 02.7  25.3 - 66.4 %   Platelets 317  150 - 400 K/uL  POCT ACTIVATED CLOTTING TIME      Component Value Range   Activated Clotting Time 185    POCT ACTIVATED CLOTTING TIME      Component Value Range   Activated Clotting Time 175      EKG Orders  placed in visit on 08/01/12  . EKG 12-LEAD     Prior Assessment and Plan Problem List as of 08/01/2012          STEMI (ST elevation myocardial infarction)   HTN (hypertension)   Hypokalemia   CAD (coronary artery disease)   Hyperlipidemia   Unstable angina   NSTEMI (non-ST elevated myocardial infarction)   Aspirin allergy       Imaging: Dg Chest Portable 1 View  07/26/2012  *RADIOLOGY REPORT*  Clinical Data: Chest pain.  PORTABLE CHEST - 1 VIEW  Comparison: One-view chest 04/10/2012.  Findings: The heart size is normal.  Mild interstitial coarsening is likely chronic.  No focal airspace disease is evident.  There is no edema or effusion to suggest failure.  The visualized soft tissues and bony thorax are unremarkable.  IMPRESSION:  1.  No acute cardiopulmonary disease. 2.  Improved aeration. 3.  Bilateral interstitial coarsening appears chronic.   Original Report Authenticated By: Marin Roberts,  M.D.      [facility] Calculation: Score not calculated. Missing: Total Cholesterol

## 2012-08-04 ENCOUNTER — Encounter (HOSPITAL_COMMUNITY): Payer: Federal, State, Local not specified - PPO

## 2012-08-06 ENCOUNTER — Encounter (HOSPITAL_COMMUNITY): Payer: Federal, State, Local not specified - PPO

## 2012-08-08 ENCOUNTER — Encounter (HOSPITAL_COMMUNITY): Payer: Federal, State, Local not specified - PPO

## 2012-08-11 ENCOUNTER — Encounter (HOSPITAL_COMMUNITY): Payer: Federal, State, Local not specified - PPO

## 2012-08-13 ENCOUNTER — Encounter (HOSPITAL_COMMUNITY): Payer: Federal, State, Local not specified - PPO

## 2012-08-15 ENCOUNTER — Encounter (HOSPITAL_COMMUNITY): Payer: Federal, State, Local not specified - PPO

## 2012-08-18 ENCOUNTER — Encounter (HOSPITAL_COMMUNITY): Payer: Federal, State, Local not specified - PPO

## 2012-08-22 ENCOUNTER — Encounter (HOSPITAL_COMMUNITY): Payer: Federal, State, Local not specified - PPO

## 2012-08-25 ENCOUNTER — Encounter (HOSPITAL_COMMUNITY): Payer: Federal, State, Local not specified - PPO

## 2012-08-27 ENCOUNTER — Encounter (HOSPITAL_COMMUNITY): Payer: Federal, State, Local not specified - PPO

## 2012-08-29 ENCOUNTER — Encounter (HOSPITAL_COMMUNITY): Payer: Federal, State, Local not specified - PPO

## 2012-09-01 ENCOUNTER — Encounter (HOSPITAL_COMMUNITY): Payer: Federal, State, Local not specified - PPO

## 2012-09-01 LAB — HEPATIC FUNCTION PANEL
ALT: 14 U/L (ref 0–53)
Indirect Bilirubin: 0.3 mg/dL (ref 0.0–0.9)
Total Protein: 7.1 g/dL (ref 6.0–8.3)

## 2012-09-01 LAB — LIPID PANEL
Cholesterol: 216 mg/dL — ABNORMAL HIGH (ref 0–200)
Total CHOL/HDL Ratio: 5.7 Ratio
Triglycerides: 86 mg/dL (ref <150)
VLDL: 17 mg/dL (ref 0–40)

## 2012-09-02 ENCOUNTER — Encounter: Payer: Self-pay | Admitting: *Deleted

## 2012-09-02 MED ORDER — PRAVASTATIN SODIUM 80 MG PO TABS: 80.0000 mg | ORAL_TABLET | Freq: Every evening | ORAL | Status: DC

## 2012-09-02 NOTE — Addendum Note (Signed)
Addended by: Derry Lory A on: 09/02/2012 11:52 AM   Modules accepted: Orders

## 2012-09-03 ENCOUNTER — Encounter (HOSPITAL_COMMUNITY)
Admission: RE | Admit: 2012-09-03 | Discharge: 2012-09-03 | Disposition: A | Discharge status: Home / Self Care | Payer: Federal, State, Local not specified - PPO | Source: Ambulatory Visit | Attending: Internal Medicine | Admitting: Internal Medicine

## 2012-09-03 DIAGNOSIS — I2109 ST elevation (STEMI) myocardial infarction involving other coronary artery of anterior wall: Secondary | ICD-10-CM | POA: Diagnosis not present

## 2012-09-03 DIAGNOSIS — I2510 Atherosclerotic heart disease of native coronary artery without angina pectoris: Secondary | ICD-10-CM | POA: Diagnosis not present

## 2012-09-03 DIAGNOSIS — Z5189 Encounter for other specified aftercare: Secondary | ICD-10-CM | POA: Diagnosis not present

## 2012-09-03 DIAGNOSIS — I10 Essential (primary) hypertension: Secondary | ICD-10-CM | POA: Insufficient documentation

## 2012-09-05 ENCOUNTER — Encounter (HOSPITAL_COMMUNITY)
Admission: RE | Admit: 2012-09-05 | Discharge: 2012-09-05 | Disposition: A | Discharge status: Home / Self Care | Payer: Federal, State, Local not specified - PPO | Source: Ambulatory Visit | Attending: Internal Medicine | Admitting: Internal Medicine

## 2012-09-05 DIAGNOSIS — I2510 Atherosclerotic heart disease of native coronary artery without angina pectoris: Secondary | ICD-10-CM | POA: Diagnosis not present

## 2012-09-05 DIAGNOSIS — Z5189 Encounter for other specified aftercare: Secondary | ICD-10-CM | POA: Diagnosis not present

## 2012-09-05 DIAGNOSIS — I2109 ST elevation (STEMI) myocardial infarction involving other coronary artery of anterior wall: Secondary | ICD-10-CM | POA: Diagnosis not present

## 2012-09-05 DIAGNOSIS — I10 Essential (primary) hypertension: Secondary | ICD-10-CM | POA: Diagnosis not present

## 2012-09-08 ENCOUNTER — Encounter (HOSPITAL_COMMUNITY)
Admission: RE | Admit: 2012-09-08 | Discharge: 2012-09-08 | Disposition: A | Discharge status: Home / Self Care | Payer: Federal, State, Local not specified - PPO | Source: Ambulatory Visit | Attending: Internal Medicine | Admitting: Internal Medicine

## 2012-09-08 DIAGNOSIS — I2510 Atherosclerotic heart disease of native coronary artery without angina pectoris: Secondary | ICD-10-CM | POA: Diagnosis not present

## 2012-09-08 DIAGNOSIS — I10 Essential (primary) hypertension: Secondary | ICD-10-CM | POA: Diagnosis not present

## 2012-09-08 DIAGNOSIS — I2109 ST elevation (STEMI) myocardial infarction involving other coronary artery of anterior wall: Secondary | ICD-10-CM | POA: Diagnosis not present

## 2012-09-08 DIAGNOSIS — Z5189 Encounter for other specified aftercare: Secondary | ICD-10-CM | POA: Diagnosis not present

## 2012-09-10 ENCOUNTER — Encounter (HOSPITAL_COMMUNITY): Payer: Federal, State, Local not specified - PPO

## 2012-09-12 ENCOUNTER — Encounter (HOSPITAL_COMMUNITY): Payer: Federal, State, Local not specified - PPO

## 2012-09-15 ENCOUNTER — Encounter (HOSPITAL_COMMUNITY)
Admission: RE | Admit: 2012-09-15 | Discharge: 2012-09-15 | Disposition: A | Discharge status: Home / Self Care | Payer: Federal, State, Local not specified - PPO | Source: Ambulatory Visit | Attending: Internal Medicine | Admitting: Internal Medicine

## 2012-09-15 DIAGNOSIS — I2109 ST elevation (STEMI) myocardial infarction involving other coronary artery of anterior wall: Secondary | ICD-10-CM | POA: Diagnosis not present

## 2012-09-15 DIAGNOSIS — Z5189 Encounter for other specified aftercare: Secondary | ICD-10-CM | POA: Diagnosis not present

## 2012-09-15 DIAGNOSIS — I2510 Atherosclerotic heart disease of native coronary artery without angina pectoris: Secondary | ICD-10-CM | POA: Diagnosis not present

## 2012-09-15 DIAGNOSIS — I10 Essential (primary) hypertension: Secondary | ICD-10-CM | POA: Diagnosis not present

## 2012-09-17 ENCOUNTER — Encounter (HOSPITAL_COMMUNITY): Payer: Federal, State, Local not specified - PPO

## 2012-09-19 ENCOUNTER — Ambulatory Visit (INDEPENDENT_AMBULATORY_CARE_PROVIDER_SITE_OTHER): Payer: Federal, State, Local not specified - PPO | Admitting: Internal Medicine

## 2012-09-19 ENCOUNTER — Encounter (HOSPITAL_COMMUNITY)
Admission: RE | Admit: 2012-09-19 | Discharge: 2012-09-19 | Disposition: A | Discharge status: Home / Self Care | Payer: Federal, State, Local not specified - PPO | Source: Ambulatory Visit | Attending: Internal Medicine | Admitting: Internal Medicine

## 2012-09-19 ENCOUNTER — Encounter: Payer: Self-pay | Admitting: Internal Medicine

## 2012-09-19 VITALS — BP 112/72 | HR 67 | Ht 65.0 in | Wt 192.0 lb

## 2012-09-19 DIAGNOSIS — I10 Essential (primary) hypertension: Secondary | ICD-10-CM

## 2012-09-19 DIAGNOSIS — Z5189 Encounter for other specified aftercare: Secondary | ICD-10-CM | POA: Diagnosis not present

## 2012-09-19 DIAGNOSIS — E782 Mixed hyperlipidemia: Secondary | ICD-10-CM | POA: Diagnosis not present

## 2012-09-19 DIAGNOSIS — I2510 Atherosclerotic heart disease of native coronary artery without angina pectoris: Secondary | ICD-10-CM | POA: Diagnosis not present

## 2012-09-19 DIAGNOSIS — I2109 ST elevation (STEMI) myocardial infarction involving other coronary artery of anterior wall: Secondary | ICD-10-CM | POA: Diagnosis not present

## 2012-09-19 NOTE — Progress Notes (Signed)
HPI Bradley Hurley is a pleasant 67 y/o male patient of Dr.Paula Tenny Craw we are following for ongoing assessment and treatment of CAD with STEMI in 03/2012 s/p mid LAD thrombectomy with DES X 2 placed to proximal and mid segments. He was seen again at [facility] hospital after admission to [facility] with recurrent chest pain on 07/26/2012. He was diagnosed with NSTEMI with mild increase in cardiac enzymes. Cardiac catheterization was completed by Dr. Clifton James which revealed 20% distal LM, patent prox and mid LAD stents, with 25% ISR of the later, (unchanged since 03/2012), 30%-40% ostial LCx, 40-50% distal marginal, 30% distal serial RCA stenosis, with LVEF of 55% with apical hypokinesis. Recommendation to continue medical therapy and addition of isosorbide mono 30 mg was given. He was to stay on Effient and pravastatin 40 mg. He continues in cardiac rehab.   Last lipid panel in Dec LDL was 161  Had been 101 in august Pravachol increased to 80 mg per day.  Since seen he continues with cardiac rehab.  Walking on treadmill about 1 hour Had 1 episode of mild pain in R neck about a week ago  Not with activity.  NO SOB  No CP  Took NTG  Relieved    Allergies  Allergen Reactions  . Aspirin Anaphylaxis    Throat closes    Current Outpatient Prescriptions  Medication Sig Dispense Refill  . fluticasone (FLONASE) 50 MCG/ACT nasal spray Place 2 sprays into the nose daily as needed. For allergies      . hydrochlorothiazide (HYDRODIURIL) 25 MG tablet Take 0.5 tablets (12.5 mg total) by mouth daily.  30 tablet  11  . isosorbide mononitrate (IMDUR) 30 MG 24 hr tablet Take 1 tablet (30 mg total) by mouth daily.  30 tablet  3  . lisinopril (PRINIVIL,ZESTRIL) 10 MG tablet Take 1 tablet (10 mg total) by mouth daily.  30 tablet  11  . LORazepam (ATIVAN) 1 MG tablet Take 0.5 tablets (0.5 mg total) by mouth 3 (three) times daily as needed for anxiety.  15 tablet  0  . metoprolol tartrate (LOPRESSOR) 25 MG tablet Take 1 tablet (25 mg  total) by mouth 2 (two) times daily.  60 tablet  10  . nitroGLYCERIN (NITROSTAT) 0.4 MG SL tablet Place 1 tablet (0.4 mg total) under the tongue every 5 (five) minutes as needed for chest pain.  50 tablet  6  . prasugrel (EFFIENT) 10 MG TABS Take 1 tablet (10 mg total) by mouth daily.  30 tablet  11  . pravastatin (PRAVACHOL) 80 MG tablet Take 1 tablet (80 mg total) by mouth every evening.  90 tablet  1    Past Medical History  Diagnosis Date  . Coronary atherosclerosis of native coronary artery 07/2002    Previous patient of [facility]; PCI  2003-90% septal perforator, 90% proximal and mid LAD->DES x2, normal CX, luminal irregularities in RCA, normal EF  . Essential hypertension, benign   . Hyperlipidemia     Lipid profile in 03/2012:182, 409,81,191  . Obstructive sleep apnea     [facility] - 05/2011  . Tinnitus   . Erectile dysfunction     Low serum testosterone level  . Depression   . Obesity   . Deviated septum   . 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 MI (myocardial infarction)     Anterior STEMI 8/13  . CAD (coronary artery disease)     Past Surgical History  Procedure Date  . Splenectomy   . Appendectomy   . Cardiac catheterization 07/28/2012    20% distal left main, patent prox & mid LAD stents w/ 25% ISR of the latter (unchanged from 03/2012 cath, mid-LAD stent placed in 2003), 30-40% ostial LCx, 40-50% distal marginal, 30% distal serial RCA stenoses; LVEF 55% with apical HK  . Coronary angioplasty with stent placement 2003    mid LAD  . Coronary angioplasty with stent placement 03/2012    STEMI s/p DES-prox LAD    Family History  Problem Relation Age of Onset  . Arrhythmia Father   . Cancer Mother     History   Social History  . Marital Status: Married    Spouse Name: N/A    Number of Children: N/A  . Years of Education: N/A   Occupational History  . 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  Smoking status: Former Smoker -- 1.0 packs/day for 15 years    Types: Cigarettes  . Smokeless tobacco: Former Neurosurgeon    Quit date: 01/30/1981     Comment: Quit in 1982  . Alcohol Use: 0.5 oz/week    1 drink(s) per week     Comment: Rare consumption - once/twice per week  . Drug Use: No  . Sexually Active: Yes   Other Topics Concern  . 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    Review of Systems:  All systems reviewed.  They are negative to the above problem except as previously stated.  Vital Signs: BP 112/72  Pulse 67  Ht 5\' 5"  (1.651 m)  Wt 192 lb 0.6 oz (87.109 kg)  BMI 31.96 kg/m2  Physical Exam Patient is in NAD HEENT:  Normocephalic, atraumatic. EOMI, PERRLA.  Neck: JVP is normal.  No bruits.  Lungs: clear to auscultation. No rales no wheezes.  Heart: Regular rate and rhythm. Normal S1, S2. No S3.   No significant murmurs. PMI not displaced.  Abdomen:  Supple, nontender. Normal bowel sounds. No masses. No hepatomegaly.  Extremities:   Good distal pulses throughout. No lower extremity edema.  Musculoskeletal :moving all extremities.  Neuro:   alert and oriented x3.  CN II-XII grossly intact.   Assessment and Plan:  1.  CAD  No symptoms to sugg angina.  Keep on same regimen  F/U in 6 months  2.  HL  Will set up for repeat labs in a few wks.

## 2012-09-19 NOTE — Patient Instructions (Signed)
Your physician recommends that you schedule a follow-up appointment in: 6 MONTH WITH ROSS  Your physician recommends that you return for lab work in: MID FEBRUARY (FASTING LIPIDS,AST) WE WILL MAIL YOU A REMINDER LETTER TO HAVE THESE DRAWN

## 2012-09-22 ENCOUNTER — Encounter (HOSPITAL_COMMUNITY): Payer: Federal, State, Local not specified - PPO

## 2012-09-24 ENCOUNTER — Encounter (HOSPITAL_COMMUNITY): Payer: Federal, State, Local not specified - PPO

## 2012-09-26 ENCOUNTER — Encounter (HOSPITAL_COMMUNITY): Payer: Federal, State, Local not specified - PPO

## 2012-09-29 ENCOUNTER — Encounter (HOSPITAL_COMMUNITY)
Admission: RE | Admit: 2012-09-29 | Discharge: 2012-09-29 | Disposition: A | Discharge status: Home / Self Care | Payer: Federal, State, Local not specified - PPO | Source: Ambulatory Visit | Attending: Internal Medicine | Admitting: Internal Medicine

## 2012-09-29 DIAGNOSIS — Z5189 Encounter for other specified aftercare: Secondary | ICD-10-CM | POA: Diagnosis not present

## 2012-09-29 DIAGNOSIS — I2510 Atherosclerotic heart disease of native coronary artery without angina pectoris: Secondary | ICD-10-CM | POA: Diagnosis not present

## 2012-09-29 DIAGNOSIS — I10 Essential (primary) hypertension: Secondary | ICD-10-CM | POA: Diagnosis not present

## 2012-09-29 DIAGNOSIS — I2109 ST elevation (STEMI) myocardial infarction involving other coronary artery of anterior wall: Secondary | ICD-10-CM | POA: Insufficient documentation

## 2012-10-01 ENCOUNTER — Encounter (HOSPITAL_COMMUNITY)
Admission: RE | Admit: 2012-10-01 | Discharge: 2012-10-01 | Disposition: A | Discharge status: Home / Self Care | Payer: Federal, State, Local not specified - PPO | Source: Ambulatory Visit | Attending: Internal Medicine | Admitting: Internal Medicine

## 2012-10-03 ENCOUNTER — Encounter: Payer: Self-pay | Admitting: *Deleted

## 2012-10-03 ENCOUNTER — Other Ambulatory Visit: Payer: Self-pay | Admitting: *Deleted

## 2012-10-03 ENCOUNTER — Encounter (HOSPITAL_COMMUNITY)
Admission: RE | Admit: 2012-10-03 | Discharge: 2012-10-03 | Disposition: A | Discharge status: Home / Self Care | Payer: Federal, State, Local not specified - PPO | Source: Ambulatory Visit | Attending: Internal Medicine | Admitting: Internal Medicine

## 2012-10-03 DIAGNOSIS — E782 Mixed hyperlipidemia: Secondary | ICD-10-CM

## 2012-10-03 DIAGNOSIS — I10 Essential (primary) hypertension: Secondary | ICD-10-CM

## 2012-10-06 ENCOUNTER — Encounter (HOSPITAL_COMMUNITY)
Admission: RE | Admit: 2012-10-06 | Discharge: 2012-10-06 | Disposition: A | Discharge status: Home / Self Care | Payer: Federal, State, Local not specified - PPO | Source: Ambulatory Visit | Attending: Internal Medicine | Admitting: Internal Medicine

## 2012-10-08 ENCOUNTER — Encounter (HOSPITAL_COMMUNITY)
Admission: RE | Admit: 2012-10-08 | Discharge: 2012-10-08 | Disposition: A | Discharge status: Home / Self Care | Payer: Federal, State, Local not specified - PPO | Source: Ambulatory Visit | Attending: Internal Medicine | Admitting: Internal Medicine

## 2012-10-10 ENCOUNTER — Encounter (HOSPITAL_COMMUNITY): Payer: Federal, State, Local not specified - PPO

## 2012-10-13 ENCOUNTER — Encounter (HOSPITAL_COMMUNITY)
Admission: RE | Admit: 2012-10-13 | Discharge: 2012-10-13 | Disposition: A | Discharge status: Home / Self Care | Payer: Federal, State, Local not specified - PPO | Source: Ambulatory Visit | Attending: Internal Medicine | Admitting: Internal Medicine

## 2012-10-15 ENCOUNTER — Encounter (HOSPITAL_COMMUNITY): Payer: Federal, State, Local not specified - PPO

## 2012-10-17 ENCOUNTER — Encounter (HOSPITAL_COMMUNITY)
Admission: RE | Admit: 2012-10-17 | Discharge: 2012-10-17 | Disposition: A | Discharge status: Home / Self Care | Payer: Federal, State, Local not specified - PPO | Source: Ambulatory Visit | Attending: Internal Medicine | Admitting: Internal Medicine

## 2012-10-20 ENCOUNTER — Encounter (HOSPITAL_COMMUNITY): Payer: Federal, State, Local not specified - PPO

## 2012-10-22 ENCOUNTER — Encounter (HOSPITAL_COMMUNITY)
Admission: RE | Admit: 2012-10-22 | Discharge: 2012-10-22 | Disposition: A | Discharge status: Home / Self Care | Payer: Federal, State, Local not specified - PPO | Source: Ambulatory Visit | Attending: Internal Medicine | Admitting: Internal Medicine

## 2012-10-24 ENCOUNTER — Encounter (HOSPITAL_COMMUNITY): Payer: Federal, State, Local not specified - PPO

## 2012-11-10 NOTE — Progress Notes (Signed)
Cardiac Rehabilitation Program Outcomes Report   Orientation:  05/20/2012 1st Week Report: 05/30/2012 Graduate Date:  tbd Discharge Date:  tbd # of sessions completed: 3 DX: Mi, CAD and StentX1 and STEMI  Cardiologist: Julious Payer Family MD:  Ellene Route Time:  11:00  A.  Exercise Program:  Tolerates exercise @ 3.62 METS for 15 minutes and Walk Test Results:  Pre: 6 minutes walk test HR 65, BP 122/70, O2 97, RPE 9, and RPD 9. 6 minute HR 74, BP 142/82, O2 99, RPE 11 and RPD 11, Post HR 66, BP 140/78, O2 98, RPE 9, and RPD 9. Walked 1200 ft at 2.27 Mph  2.7 METS  B.  Mental Health:  Good mental attitude  C.  Education/Instruction/Skills  Accurately checks own pulse.  Rest:  60  Exercise: 92, Knows THR for exercise and Uses Perceived Exertion Scale and/or Dyspnea Scale  Uses Perceived Exertion Scale and/or Dyspnea Scale  D.  Nutrition/Weight Control/Body Composition:  Adherence to prescribed nutrition program: good    E.  Blood Lipids    Lab Results  Component Value Date   CHOL 216* 08/01/2012   HDL 38* 08/01/2012   LDLCALC 161* 08/01/2012   TRIG 86 08/01/2012   CHOLHDL 5.7 08/01/2012    F.  Lifestyle Changes:  Making positive lifestyle changes  G.  Symptoms noted with exercise:  Asymptomatic  Report Completed By:  Lelon Huh. Rudransh Bellanca RN    Comments:  This is patients 1st week report. He achieved a peak METS of 3.62.  His resting HR was 60 and resting BP is 120/78, His peak Hr is 92 and peak BP was 142/80. A report will be sent on his halfway  Point. His 18th visit.

## 2013-01-09 NOTE — Progress Notes (Signed)
Cardiac Rehabilitation Program Outcomes Report   Orientation:  05/20/2012 Halfway Report: 07/09/2012 Graduate Date:  tbd Discharge Date:  tbd # of sessions completed: 18 DX STEMI, Stents X 2  Cardiologist: Dietrich Pates Family MD:  Ellene Route Time:  11:00  A.  Exercise Program:  Tolerates exercise @ 4.04 METS for 15 minutes  B.  Mental Health:  Good mental attitude  C.  Education/Instruction/Skills  Accurately checks own pulse.  Rest:  78  Exercise:112, Knows THR for exercise and Uses Perceived Exertion Scale and/or Dyspnea Scale  Uses Perceived Exertion Scale and/or Dyspnea Scale  D.  Nutrition/Weight Control/Body Composition:  Adherence to prescribed nutrition program: good    E.  Blood Lipids    Lab Results  Component Value Date   CHOL 216* 08/01/2012   HDL 38* 08/01/2012   LDLCALC 161* 08/01/2012   TRIG 86 08/01/2012   CHOLHDL 5.7 08/01/2012    F.  Lifestyle Changes:  Making positive lifestyle changes  G.  Symptoms noted with exercise:  Asymptomatic  Report Completed By:  Lelon Huh. Jameeka Marcy RN   Comments:  This is patients Halfway report. He achieved a peak METS of 4.04. His resting HR was 78 and resting BP was 136/60, His peak HR was 112 and peak BP was 140/80. He has done well. A graduation report will follow upon his 32 th visit.

## 2013-01-29 ENCOUNTER — Other Ambulatory Visit: Payer: Self-pay | Admitting: Cardiology

## 2013-02-23 NOTE — Progress Notes (Signed)
Cardiac Rehabilitation Program Outcomes Report   Orientation:  05/20/2012 Graduate Date:  10/22/2012 Discharge Date:  10/22/2012 # of sessions completed: 36 DX: Stent X 2  Cardiologist: Dietrich Pates Family MD:  John Giovanni Class Time:  11:00  A.  Exercise Program:  Tolerates exercise @ 4.04 METS for 15 minutes, Walk Test Results:  Post: Post walk Test resting HR 51, BP 112/60, O2 97%, Rpe 7 and RPD 7, 6 min HR 92, BP 150/70, O2 98%, RPE 11 and RPD 11, POst Hr 65, BP 114/78, O2 96%, RPE 8 and RPD 8, walked 1600 sg feet.  and Discharged to home exercise program.  Anticipated compliance:  excellent  B.  Mental Health:  Good mental attitude  C.  Education/Instruction/Skills  Accurately checks own pulse.  Rest:  51  Exercise:  82, Knows THR for exercise, Uses Perceived Exertion Scale and/or Dyspnea Scale and Attended 13 education classes  Uses Perceived Exertion Scale and/or Dyspnea Scale  D.  Nutrition/Weight Control/Body Composition:  Adherence to prescribed nutrition program: good    E.  Blood Lipids    Lab Results  Component Value Date   CHOL 216* 08/01/2012   HDL 38* 08/01/2012   LDLCALC 161* 08/01/2012   TRIG 86 08/01/2012   CHOLHDL 5.7 08/01/2012    F.  Lifestyle Changes:  Making positive lifestyle changes and Not smoking:  Quit 1982  G.  Symptoms noted with exercise:  Asymptomatic  Report Completed By:  Lelon Huh. Rhyan Radler RN   Comments:  This is patients graduation report.He achieved a peak METS of 4.04. His resting HR was 51 and resting BP was 112/60. His peak HR was 82 and peak BP was 140/80. He did very well while in rehab.

## 2013-05-12 ENCOUNTER — Other Ambulatory Visit: Payer: Self-pay | Admitting: Adult Health

## 2013-06-15 ENCOUNTER — Telehealth: Payer: Self-pay

## 2013-06-15 MED ORDER — LISINOPRIL 10 MG PO TABS: 10.0000 mg | ORAL_TABLET | Freq: Every day | ORAL | Status: DC

## 2013-06-15 NOTE — Telephone Encounter (Signed)
Medication sent via escribe.  

## 2013-06-15 NOTE — Telephone Encounter (Signed)
Received fax refill request  Rx # [id number] Medication:  Lisinopril 10 mg tablet Qty 30 Sig:  Take 1 tablet by mouth once daily Physician:  Tenny Craw

## 2013-07-17 ENCOUNTER — Encounter (INDEPENDENT_AMBULATORY_CARE_PROVIDER_SITE_OTHER): Payer: Self-pay

## 2013-07-17 ENCOUNTER — Ambulatory Visit (INDEPENDENT_AMBULATORY_CARE_PROVIDER_SITE_OTHER): Payer: Federal, State, Local not specified - PPO | Admitting: Internal Medicine

## 2013-07-17 ENCOUNTER — Encounter: Payer: Self-pay | Admitting: Internal Medicine

## 2013-07-17 VITALS — BP 156/83 | HR 66 | Temp 98.8°F | Ht 65.0 in | Wt 192.0 lb

## 2013-07-17 DIAGNOSIS — K921 Melena: Secondary | ICD-10-CM

## 2013-07-17 NOTE — Progress Notes (Signed)
Primary Care Physician:  KNOWLTON,STEPHEN D, MD Primary Gastroenterologist:  Dr. Antoney Biven  Pre-Procedure History & Physical: HPI:  Bradley Hurley is a 67 y.o. male here for evaluation of intermittent hematochezia. Patient suffered an MI last year -  had multiple DES's placed. On Effient. Overdue to see the cardiologist. Experiencing intermittent fresh blood and clot per rectum. Feels he has prolapsing hemorrhoids. Intermittent low volume hematochezia for years a bit more these days. He was making preparation to undergo his first screening colonoscopy last year when acute cardiac events precluded this from happening. No family history of colon cancer or colon polyps. Patient denies abdominal pain,  Dysphagia, odynophagia, early satiety nausea or vomiting. He states he's lost about 8 pounds in the past 6 months.  Past Medical History  Diagnosis Date  . Coronary atherosclerosis of native coronary artery 07/2002    Previous patient of [facility]; PCI  2003-90% septal perforator, 90% proximal and mid LAD->DES x2, normal CX, luminal irregularities in RCA, normal EF  . Essential hypertension, benign   . Hyperlipidemia     Lipid profile in 03/2012:182, 110,48,112  . Obstructive sleep apnea     [facility] - 05/2011  . Tinnitus   . Erectile dysfunction     Low serum testosterone level  . Depression   . Obesity   . Deviated septum   . 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 MI (myocardial infarction)     Anterior STEMI 8/13  . CAD (coronary artery disease)     Past Surgical History  Procedure Laterality Date  . Splenectomy    . Appendectomy    . Cardiac catheterization  07/28/2012    20% distal left main, patent prox & mid LAD stents w/ 25% ISR of the latter (unchanged from 03/2012 cath, mid-LAD stent placed in 2003), 30-40% ostial LCx, 40-50% distal marginal, 30% distal serial RCA stenoses; LVEF 55% with apical HK  . Coronary angioplasty with stent placement  2003   mid LAD  . Coronary angioplasty with stent placement  03/2012    STEMI s/p DES-prox LAD    Prior to Admission medications   Medication Sig Start Date End Date Taking? Authorizing Provider  acetaminophen (TYLENOL) 500 MG tablet Take 500 mg by mouth every 6 (six) hours as needed.   Yes Historical Provider, MD  clindamycin (CLEOCIN) 150 MG capsule Take 150 mg by mouth 4 (four) times daily. 07/01/13  Yes Historical Provider, MD  EFFIENT 10 MG TABS tablet take 1 tablet by mouth once daily 05/12/13  Yes Kathryn M Lawrence, NP  fluticasone (FLONASE) 50 MCG/ACT nasal spray Place 2 sprays into the nose daily as needed. For allergies   Yes Historical Provider, MD  HYDROcodone-acetaminophen (NORCO/VICODIN) 5-325 MG per tablet Take by mouth as needed. 06/23/13  Yes Historical Provider, MD  isosorbide mononitrate (IMDUR) 30 MG 24 hr tablet take 1 tablet by mouth once daily 01/29/13  Yes Paula V Ross, MD  lisinopril (PRINIVIL,ZESTRIL) 10 MG tablet Take 1 tablet (10 mg total) by mouth daily. 06/15/13 06/15/14 Yes Paula V Ross, MD  nitroGLYCERIN (NITROSTAT) 0.4 MG SL tablet Place 1 tablet (0.4 mg total) under the tongue every 5 (five) minutes as needed for chest pain. 07/30/12  Yes Melesio M Rothbart, MD  pravastatin (PRAVACHOL) 80 MG tablet Take 1 tablet (80 mg total) by mouth every evening. 09/02/12  Yes Kathryn M Lawrence, NP  hydrochlorothiazide (HYDRODIURIL) 25 MG tablet Take 0.5 tablets (12.5 mg   total) by mouth daily. 06/02/12   Paula V Ross, MD  LORazepam (ATIVAN) 1 MG tablet Take 0.5 tablets (0.5 mg total) by mouth 3 (three) times daily as needed for anxiety. 06/06/12   Alan Davidson III, MD  metoprolol tartrate (LOPRESSOR) 25 MG tablet Take 1 tablet (25 mg total) by mouth 2 (two) times daily. 04/13/12 04/13/13  Kathryn M Lawrence, NP    Allergies as of 07/17/2013 - Review Complete 07/17/2013  Allergen Reaction Noted  . Aspirin Anaphylaxis 04/09/2012    Family History  Problem Relation Age of Onset  .  Arrhythmia Father   . Cancer Mother     History   Social History  . Marital Status: Married    Spouse Name: N/A    Number of Children: N/A  . Years of Education: N/A   Occupational History  . Rental property manager     self-employed   Social History Main Topics  . Smoking status: Former Smoker -- 1.00 packs/day for 15 years    Types: Cigarettes  . Smokeless tobacco: Former User    Quit date: 01/30/1981     Comment: Quit in 1982  . Alcohol Use: 0.5 oz/week    1 drink(s) per week     Comment: Rare consumption - once/twice per week  . Drug Use: No  . Sexual Activity: Yes   Other Topics Concern  . 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    Review of Systems: See HPI, otherwise negative ROS  Physical Exam: BP 156/83  Pulse 66  Temp(Src) 98.8 F (37.1 C) (Oral)  Ht 5' 5" (1.651 m)  Wt 192 lb (87.091 kg)  BMI 31.95 kg/m2 General:   Alert,  Well-developed, well-nourished, pleasant and cooperative in NAD Skin:  Intact without significant lesions or rashes. Eyes:  Sclera clear, no icterus.   Conjunctiva pink. Ears:  Normal auditory acuity. Nose:  No deformity, discharge,  or lesions. Mouth:  No deformity or lesions. Neck:  Supple; no masses or thyromegaly. No significant cervical adenopathy. Lungs:  Clear throughout to auscultation.   No wheezes, crackles, or rhonchi. No acute distress. Heart:  Regular rate and rhythm; no murmurs, clicks, rubs,  or gallops. Abdomen: Obese. Non-distended, normal bowel sounds.  Soft and nontender without appreciable mass or hepatosplenomegaly.  Pulses:  Normal pulses noted. Extremities:  Without clubbing or edema. Rectal:  Small external hemorrhoidal tag. Digital exam no mass. No stool. Mucousy strongly Hemoccult positive.   Impression:    Pleasant 67-year-old gentleman with somewhat chronic intermittent rectal bleeding -  certainly worse over the past several weeks. He is Hemoccult positive on  digital rectal exam today.  He is overdue for colorectal cancer screening as well. He is on Effient given his history of stent placement.   Recommendations:  Patient needs a colonoscopy.The risks, benefits, limitations, alternatives and imponderables have been reviewed with the patient. Questions have been answered. All parties are agreeable.  Concomitant therapy with Effient would increase the risk of bleeding with polypectomy. I would like to communicate with Dr. Ross see whether or not we can interrupt Effient therapy for colonoscopy. If not, I feel we can safely proceed although removal of the larger polyps may be more problematic.  I have discussed at length with the patient today. Anticipate scheduling colonoscopy in the near future either way. 

## 2013-07-17 NOTE — Patient Instructions (Signed)
Will schedule a diagnostic colonoscopy in the near future to evaluate rectal bleeding and for colorectal cancer screening.  You may ultimately need in-office hemorrhoid banding.  Will consult with Dr. Tenny Craw regarding her thoughts about continuing Effient therapy during the colonoscopy.  Further recommendations to follow in the very near future.

## 2013-07-17 NOTE — Progress Notes (Signed)
Patient's last intervention was August 2013  She had a cath in November 2013 but no intervention I think she is ok ot come off of Effient for the procedure but I would resume when safe after.

## 2013-07-20 ENCOUNTER — Other Ambulatory Visit: Payer: Self-pay | Admitting: Internal Medicine

## 2013-07-20 LAB — LIPID PANEL
LDL Cholesterol: 135 mg/dL — ABNORMAL HIGH (ref 0–99)
Triglycerides: 76 mg/dL (ref <150)

## 2013-07-20 LAB — AST: AST: 18 U/L (ref 0–37)

## 2013-07-21 ENCOUNTER — Other Ambulatory Visit: Payer: Self-pay | Admitting: *Deleted

## 2013-07-21 DIAGNOSIS — E7800 Pure hypercholesterolemia, unspecified: Secondary | ICD-10-CM

## 2013-07-21 MED ORDER — ATORVASTATIN CALCIUM 20 MG PO TABS: 20.0000 mg | ORAL_TABLET | Freq: Every day | ORAL | Status: DC

## 2013-07-22 ENCOUNTER — Other Ambulatory Visit: Payer: Self-pay | Admitting: Internal Medicine

## 2013-07-22 DIAGNOSIS — K921 Melena: Secondary | ICD-10-CM

## 2013-07-22 MED ORDER — PEG 3350-KCL-NA BICARB-NACL 420 G PO SOLR: 4000.0000 mL | ORAL | Status: DC

## 2013-07-30 ENCOUNTER — Ambulatory Visit (INDEPENDENT_AMBULATORY_CARE_PROVIDER_SITE_OTHER): Payer: Federal, State, Local not specified - PPO | Admitting: Adult Health

## 2013-07-30 ENCOUNTER — Encounter: Payer: Self-pay | Admitting: Adult Health

## 2013-07-30 VITALS — BP 132/75 | HR 88 | Ht 65.0 in | Wt 192.0 lb

## 2013-07-30 DIAGNOSIS — I219 Acute myocardial infarction, unspecified: Secondary | ICD-10-CM

## 2013-07-30 DIAGNOSIS — E7800 Pure hypercholesterolemia, unspecified: Secondary | ICD-10-CM

## 2013-07-30 DIAGNOSIS — E785 Hyperlipidemia, unspecified: Secondary | ICD-10-CM

## 2013-07-30 DIAGNOSIS — I200 Unstable angina: Secondary | ICD-10-CM | POA: Diagnosis not present

## 2013-07-30 DIAGNOSIS — I213 ST elevation (STEMI) myocardial infarction of unspecified site: Secondary | ICD-10-CM

## 2013-07-30 MED ORDER — PRASUGREL HCL 10 MG PO TABS: ORAL_TABLET | ORAL | Status: DC

## 2013-07-30 MED ORDER — PRAVASTATIN SODIUM 40 MG PO TABS: 40.0000 mg | ORAL_TABLET | Freq: Every day | ORAL | Status: DC

## 2013-07-30 MED ORDER — ISOSORBIDE MONONITRATE ER 30 MG PO TB24: ORAL_TABLET | ORAL | Status: DC

## 2013-07-30 MED ORDER — LISINOPRIL 10 MG PO TABS: 10.0000 mg | ORAL_TABLET | Freq: Every day | ORAL | Status: DC

## 2013-07-30 MED ORDER — ATORVASTATIN CALCIUM 20 MG PO TABS: 20.0000 mg | ORAL_TABLET | Freq: Every day | ORAL | Status: DC

## 2013-07-30 NOTE — Patient Instructions (Addendum)
Your physician recommends that you schedule a follow-up appointment in: 3 months You will receive a reminder letter two months in advance reminding you to call and schedule your appointment. If you don't receive this letter, please contact our office.  Your physician recommends that you continue on your current medications as directed. Please refer to the Current Medication list given to you today.   Your physician recommends that you return for lab work in 3 months on October 28 2013

## 2013-07-30 NOTE — Assessment & Plan Note (Signed)
No recurrent complaints of chest pain. He wants to stop the Effient, but I have advised him to stay on this medication for another year, maybe longer with thrombotic STEMI. He is okay to stop Effient temporarily for 5 days, prior to colonoscopy, but he is advised to take it the day after the procedure. Will continue risk management.

## 2013-07-30 NOTE — Progress Notes (Deleted)
Name: Bradley Hurley    DOB: 1946-01-31  Age: 67 y.o.  MR#: [id number]       PCP:  [name], MD      Insurance: Payor: BLUE CROSS BLUE SHIELD / Plan: BCBS/F[employer]ER[employer] PPO / Product Type: *No Product type* /   CC:    Chief Complaint  Patient presents with  . Hypertension   PT HAS NOT CHANGED TO LIPITOR AT THIS TIME PER ADVISED BY RECENT LABS FROM DR ROSS TO CHANGE FROM PRAVASTATIN TO LIPITOR HOWEVER INSTRUCTIONS WERE NOT CLEAR, PT HAS CONTINUE TO TAKE PRAVASTATIN UNTIL TODAY TO DISCUSS THE NEXT STEPS, STOPPED TAKING HCTZ 4 MONTHS ABOUT PER SLEEP DEPROVATION ISSUES CAUSING HIM TO STAY AWAKE  VS Filed Vitals:   07/30/13 1453  BP: 132/75  Pulse: 88  Height: 5\' 5"  (1.651 m)  Weight: 192 lb (87.091 kg)  SpO2: 98%    Weights Current Weight  07/30/13 192 lb (87.091 kg)  07/17/13 192 lb (87.091 kg)  09/19/12 192 lb 0.6 oz (87.109 kg)    Blood Pressure  BP Readings from Last 3 Encounters:  07/30/13 132/75  07/17/13 156/83  09/19/12 112/72     Admit date:  (Not on file) Last encounter with RMR:  05/12/2013   Allergy Aspirin  Current Outpatient Prescriptions  Medication Sig Dispense Refill  . acetaminophen (TYLENOL) 500 MG tablet Take 500 mg by mouth every 6 (six) hours as needed.      Marland Kitchen EFFIENT 10 MG TABS tablet take 1 tablet by mouth once daily  30 tablet  1  . fluticasone (FLONASE) 50 MCG/ACT nasal spray Place 2 sprays into the nose daily as needed. For allergies      . isosorbide mononitrate (IMDUR) 30 MG 24 hr tablet take 1 tablet by mouth once daily  30 tablet  3  . lisinopril (PRINIVIL,ZESTRIL) 10 MG tablet Take 1 tablet (10 mg total) by mouth daily.  30 tablet  0  . LORazepam (ATIVAN) 1 MG tablet Take 0.5 tablets (0.5 mg total) by mouth 3 (three) times daily as needed for anxiety.  15 tablet  0  . nitroGLYCERIN (NITROSTAT) 0.4 MG SL tablet Place 1 tablet (0.4 mg total) under the tongue every 5 (five) minutes as needed for chest pain.  50 tablet  6  . pravastatin  (PRAVACHOL) 40 MG tablet Take 40 mg by mouth daily.       Marland Kitchen atorvastatin (LIPITOR) 20 MG tablet Take 1 tablet (20 mg total) by mouth daily.  90 tablet  3   No current facility-administered medications for this visit.    Discontinued Meds:    Medications Discontinued During This Encounter  Medication Reason  . clindamycin (CLEOCIN) 150 MG capsule Error  . polyethylene glycol-electrolytes (TRILYTE) 420 G solution Error  . metoprolol tartrate (LOPRESSOR) 25 MG tablet Error  . HYDROcodone-acetaminophen (NORCO/VICODIN) 5-325 MG per tablet Error  . traMADol (ULTRAM) 50 MG tablet Error  . hydrochlorothiazide (HYDRODIURIL) 25 MG tablet Error    Patient Active Problem List   Diagnosis Date Noted  . NSTEMI (non-ST elevated myocardial infarction) 07/28/2012  . Aspirin allergy 07/28/2012  . Unstable angina 07/26/2012  . STEMI (ST elevation myocardial infarction) 04/11/2012  . HTN (hypertension) 04/11/2012  . Hypokalemia 04/11/2012  . CAD (coronary artery disease) 04/11/2012  . Hyperlipidemia 04/11/2012    LABS    Component Value Date/Time   NA 140 07/27/2012 0501   NA 142 07/26/2012 1125   NA 139 06/06/2012 1817   K 4.1  07/27/2012 0501   K 3.5 07/26/2012 1125   K 3.6 06/06/2012 1817   CL 104 07/27/2012 0501   CL 103 07/26/2012 1125   CL 102 06/06/2012 1817   CO2 29 07/27/2012 0501   CO2 27 07/26/2012 1125   CO2 27 06/06/2012 1817   GLUCOSE 109* 07/27/2012 0501   GLUCOSE 109* 07/26/2012 1125   GLUCOSE 94 06/06/2012 1817   BUN 18 07/27/2012 0501   BUN 21 07/26/2012 1125   BUN 27* 06/06/2012 1817   CREATININE 1.00 07/27/2012 0501   CREATININE 1.15 07/26/2012 1125   CREATININE 1.24 06/06/2012 1817   CALCIUM 9.2 07/27/2012 0501   CALCIUM 9.9 07/26/2012 1125   CALCIUM 10.0 06/06/2012 1817   GFRNONAA 76* 07/27/2012 0501   GFRNONAA 65* 07/26/2012 1125   GFRNONAA 59* 06/06/2012 1817   GFRAA 89* 07/27/2012 0501   GFRAA 75* 07/26/2012 1125   GFRAA 68* 06/06/2012 1817   CMP      Component Value Date/Time   NA 140 07/27/2012 0501   K 4.1 07/27/2012 0501   CL 104 07/27/2012 0501   CO2 29 07/27/2012 0501   GLUCOSE 109* 07/27/2012 0501   BUN 18 07/27/2012 0501   CREATININE 1.00 07/27/2012 0501   CALCIUM 9.2 07/27/2012 0501   PROT 7.1 08/01/2012 1409   ALBUMIN 3.9 08/01/2012 1409   AST 18 07/20/2013 1047   ALT 14 08/01/2012 1409   ALKPHOS 36* 08/01/2012 1409   BILITOT 0.4 08/01/2012 1409   GFRNONAA 76* 07/27/2012 0501   GFRAA 89* 07/27/2012 0501       Component Value Date/Time   WBC 9.6 07/28/2012 0500   WBC 11.0* 07/27/2012 0501   WBC 8.8 07/26/2012 1125   HGB 12.3* 07/28/2012 0500   HGB 12.3* 07/27/2012 0501   HGB 14.1 07/26/2012 1125   HCT 35.5* 07/28/2012 0500   HCT 36.7* 07/27/2012 0501   HCT 40.8 07/26/2012 1125   MCV 91.0 07/28/2012 0500   MCV 90.8 07/27/2012 0501   MCV 90.9 07/26/2012 1125    Lipid Panel     Component Value Date/Time   CHOL 194 07/20/2013 1047   TRIG 76 07/20/2013 1047   HDL 44 07/20/2013 1047   CHOLHDL 4.4 07/20/2013 1047   VLDL 15 07/20/2013 1047   LDLCALC 135* 07/20/2013 1047    ABG No results found for this basename: phart, pco2, pco2art, po2, po2art, hco3, tco2, acidbasedef, o2sat     No results found for this basename: TSH   BNP (last 3 results) No results found for this basename: PROBNP,  in the last 8760 hours Cardiac Panel (last 3 results) No results found for this basename: CKTOTAL, CKMB, TROPONINI, RELINDX,  in the last 72 hours  Iron/TIBC/Ferritin No results found for this basename: iron, tibc, ferritin     EKG Orders placed in visit on 07/30/13  . EKG 12-LEAD     Prior Assessment and Plan Problem List as of 07/30/2013   STEMI (ST elevation myocardial infarction)   Last Assessment & Plan   08/01/2012 Office Visit Written 08/01/2012  3:30 PM by Jodelle Gross, NP     Mr. Gahan will continue with medical management of his CAD. Cardiac cath completed in 06/2012 did not show any ISR. He was placed on long acting  nitrates for angina. He will continue cardiac rehab and low cholesterol diet. Would like to see him lose about 20 lbs and increase his activity. He verbalizes understanding.  I have answered multiple questions concerning his DES and  need to take Effient.     HTN (hypertension)   Hypokalemia   CAD (coronary artery disease)   Hyperlipidemia   Last Assessment & Plan   08/01/2012 Office Visit Written 08/01/2012  3:33 PM by Jodelle Gross, NP     Will check fasting lipids and LFT's as he has not had this completed since August STEMI. I have discussed low cholesterol diet.     Unstable angina   Last Assessment & Plan   08/01/2012 Office Visit Written 08/01/2012  3:35 PM by Jodelle Gross, NP     He has yet to pick up Rx for isosorbide mono 30 mg. His pharmacy had to order it. He is to pick it up today. He will see Dr.Ross in January. I have warned him that he may have a headache with the use of nitrates. He verbalizes understanding.    NSTEMI (non-ST elevated myocardial infarction)   Aspirin allergy       Imaging: No results found.

## 2013-07-30 NOTE — Progress Notes (Signed)
HPI: [name] is a 67 year old patient of Dr. Huston Foley we are following for ongoing assessment and management of CAD, this recent hospitalization for ST elevation MI in August of 2013. The patient had a mid LAD thrombectomy with drug-eluting stent x2 places proximal and mid segments. He had repeat cardiac catheterization in November 2013 which revealed a 20% distal left main, patent proximal and mid LAD stents with 25% ISR of the latter. He had a 30-40% ostial left circumflex, 40-50% distal marginal, 30% distal serial RCA stenosis. He was continued on medical therapy was asked to continue Effient pravastatin. The patient was last seen in the office in January of 2014. Patient is here for pre-evaluation prior to colonoscopy.   He comes to today without cardiac complaints. He suffers from depression and is no longer on antidepressants, taking himself off of them. He complains of generalized fatigue and apathy.   Allergies  Allergen Reactions  . Aspirin Anaphylaxis    Throat closes    Current Outpatient Prescriptions  Medication Sig Dispense Refill  . acetaminophen (TYLENOL) 500 MG tablet Take 500 mg by mouth every 6 (six) hours as needed.      Marland Kitchen EFFIENT 10 MG TABS tablet take 1 tablet by mouth once daily  30 tablet  1  . fluticasone (FLONASE) 50 MCG/ACT nasal spray Place 2 sprays into the nose daily as needed. For allergies      . isosorbide mononitrate (IMDUR) 30 MG 24 hr tablet take 1 tablet by mouth once daily  30 tablet  3  . lisinopril (PRINIVIL,ZESTRIL) 10 MG tablet Take 1 tablet (10 mg total) by mouth daily.  30 tablet  0  . LORazepam (ATIVAN) 1 MG tablet Take 0.5 tablets (0.5 mg total) by mouth 3 (three) times daily as needed for anxiety.  15 tablet  0  . nitroGLYCERIN (NITROSTAT) 0.4 MG SL tablet Place 1 tablet (0.4 mg total) under the tongue every 5 (five) minutes as needed for chest pain.  50 tablet  6  . pravastatin (PRAVACHOL) 40 MG tablet Take 40 mg by mouth daily.       Marland Kitchen  atorvastatin (LIPITOR) 20 MG tablet Take 1 tablet (20 mg total) by mouth daily.  90 tablet  3   No current facility-administered medications for this visit.    Past Medical History  Diagnosis Date  . Coronary atherosclerosis of native coronary artery 07/2002    Previous patient of [facility]; PCI  2003-90% septal perforator, 90% proximal and mid LAD->DES x2, normal CX, luminal irregularities in RCA, normal EF  . Essential hypertension, benign   . Hyperlipidemia     Lipid profile in 03/2012:182, 161,09,604  . Obstructive sleep apnea     [facility] - 05/2011  . Tinnitus   . Erectile dysfunction     Low serum testosterone level  . Depression   . Obesity   . Deviated septum   . 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 MI (myocardial infarction)     Anterior STEMI 8/13  . CAD (coronary artery disease)     Past Surgical History  Procedure Laterality Date  . Splenectomy    . Appendectomy    . Cardiac catheterization  07/28/2012    20% distal left main, patent prox & mid LAD stents w/ 25% ISR of the latter (unchanged from 03/2012 cath, mid-LAD stent placed in 2003), 30-40% ostial LCx, 40-50% distal marginal, 30% distal serial RCA stenoses;  LVEF 55% with apical HK  . Coronary angioplasty with stent placement  2003    mid LAD  . Coronary angioplasty with stent placement  03/2012    STEMI s/p DES-prox LAD    ROS: Review of systems complete and found to be negative unless listed above  PHYSICAL EXAM BP 132/75  Pulse 88  Ht 5\' 5"  (1.651 m)  Wt 192 lb (87.091 kg)  BMI 31.95 kg/m2  SpO2 98%  General: Well developed, well nourished obese, in no acute distress Head: Eyes PERRLA, No xanthomas.   Normal cephalic and atramatic  Lungs: Clear bilaterally to auscultation and percussion. Heart: HRRR S1 S2, without MRG.  Pulses are 2+ & equal.            No carotid bruit. No JVD.  No abdominal bruits. No femoral bruits. Abdomen: Bowel sounds are positive, abdomen  soft and non-tender without masses or                  Hernia's noted. Msk:  Back normal, normal gait. Normal strength and tone for age. Extremities: No clubbing, cyanosis or edema.  DP +1 Neuro: Alert and oriented X 3. Psych:  Good affect, responds appropriately  EKG: NSR rate of 71 bpm, with old anterior infarct.  ASSESSMENT AND PLAN

## 2013-07-30 NOTE — Assessment & Plan Note (Signed)
We reviewed his cholesterol panel. He requests a copy of it. There is some confusion as to what he should be taking. He was recently changed to atorvastatin from pravachol by Dr. Tenny Craw. He was confused as whether to take both or one of the medications. I have advised him to take the atorvastatin and stop the pravastatin as advised by Dr.Ross. We will check the status of his cholesterol in 3 months.

## 2013-08-05 ENCOUNTER — Encounter (HOSPITAL_COMMUNITY): Payer: Self-pay | Admitting: Pharmacy Technician

## 2013-08-11 ENCOUNTER — Other Ambulatory Visit: Payer: Self-pay | Admitting: Internal Medicine

## 2013-08-11 MED ORDER — PEG 3350-KCL-NA BICARB-NACL 420 G PO SOLR: 4000.0000 mL | ORAL | Status: DC

## 2013-08-12 ENCOUNTER — Encounter (HOSPITAL_COMMUNITY): Payer: Self-pay | Admitting: *Deleted

## 2013-08-12 ENCOUNTER — Ambulatory Visit (HOSPITAL_COMMUNITY)
Admission: RE | Admit: 2013-08-12 | Discharge: 2013-08-12 | Disposition: A | Discharge status: Home / Self Care | Payer: Federal, State, Local not specified - PPO | Source: Ambulatory Visit | Attending: Internal Medicine | Admitting: Internal Medicine

## 2013-08-12 ENCOUNTER — Encounter (HOSPITAL_COMMUNITY)
Admission: RE | Disposition: A | Discharge status: Home / Self Care | Payer: Self-pay | Source: Ambulatory Visit | Attending: Internal Medicine

## 2013-08-12 DIAGNOSIS — K644 Residual hemorrhoidal skin tags: Secondary | ICD-10-CM | POA: Insufficient documentation

## 2013-08-12 DIAGNOSIS — R933 Abnormal findings on diagnostic imaging of other parts of digestive tract: Secondary | ICD-10-CM | POA: Diagnosis not present

## 2013-08-12 DIAGNOSIS — K5730 Diverticulosis of large intestine without perforation or abscess without bleeding: Secondary | ICD-10-CM | POA: Insufficient documentation

## 2013-08-12 DIAGNOSIS — K921 Melena: Secondary | ICD-10-CM

## 2013-08-12 DIAGNOSIS — Z79899 Other long term (current) drug therapy: Secondary | ICD-10-CM | POA: Insufficient documentation

## 2013-08-12 DIAGNOSIS — Z1211 Encounter for screening for malignant neoplasm of colon: Secondary | ICD-10-CM | POA: Insufficient documentation

## 2013-08-12 DIAGNOSIS — E785 Hyperlipidemia, unspecified: Secondary | ICD-10-CM | POA: Insufficient documentation

## 2013-08-12 DIAGNOSIS — I10 Essential (primary) hypertension: Secondary | ICD-10-CM | POA: Insufficient documentation

## 2013-08-12 HISTORY — PX: COLONOSCOPY: SHX5424

## 2013-08-12 SURGERY — COLONOSCOPY
Anesthesia: Moderate Sedation

## 2013-08-12 MED ORDER — MEPERIDINE HCL 100 MG/ML IJ SOLN
INTRAMUSCULAR | Status: DC | PRN
  Administered 2013-08-12: 25 mg via INTRAVENOUS
  Administered 2013-08-12: 50 mg via INTRAVENOUS

## 2013-08-12 MED ORDER — ONDANSETRON HCL 4 MG/2ML IJ SOLN
INTRAMUSCULAR | Status: AC
  Filled 2013-08-12: qty 2

## 2013-08-12 MED ORDER — STERILE WATER FOR IRRIGATION IR SOLN
Status: DC | PRN
  Administered 2013-08-12: 10:00:00

## 2013-08-12 MED ORDER — ONDANSETRON HCL 4 MG/2ML IJ SOLN
INTRAMUSCULAR | Status: DC | PRN
  Administered 2013-08-12: 4 mg via INTRAVENOUS

## 2013-08-12 MED ORDER — SODIUM CHLORIDE 0.9 % IV SOLN
INTRAVENOUS | Status: DC
  Administered 2013-08-12: 1000 mL via INTRAVENOUS

## 2013-08-12 MED ORDER — MEPERIDINE HCL 100 MG/ML IJ SOLN
INTRAMUSCULAR | Status: AC
  Filled 2013-08-12: qty 2

## 2013-08-12 MED ORDER — MIDAZOLAM HCL 5 MG/5ML IJ SOLN
INTRAMUSCULAR | Status: AC
  Filled 2013-08-12: qty 10

## 2013-08-12 MED ORDER — MIDAZOLAM HCL 5 MG/5ML IJ SOLN
INTRAMUSCULAR | Status: DC | PRN
  Administered 2013-08-12: 1 mg via INTRAVENOUS
  Administered 2013-08-12: 2 mg via INTRAVENOUS
  Administered 2013-08-12: 1 mg via INTRAVENOUS

## 2013-08-12 NOTE — Op Note (Signed)
[facility] [address] [place], [address]   COLONOSCOPY PROCEDURE REPORT  PATIENT: Bradley Hurley, Bradley Hurley  MR#:         [id number] BIRTHDATE: 1946-03-31 , 67  yrs. old GENDER: Male ENDOSCOPIST: R.  Roetta Sessions, MD FACP FACG REFERRED BY:  Gareth Morgan, M.D.  Pricilla Riffle, M.D. PROCEDURE DATE:  08/12/2013 PROCEDURE:     Ileocolonoscopy with biopsy  INDICATIONS: self limiting paper hematochezia earlier in the year; first ever screening colonoscopy  INFORMED CONSENT:  The risks, benefits, alternatives and imponderables including but not limited to bleeding, perforation as well as the possibility of a missed lesion have been reviewed.  The potential for biopsy, lesion removal, etc. have also been discussed.  Questions have been answered.  All parties agreeable. Please see the history and physical in the medical record for more information.  MEDICATIONS: Versed 4 mg IV and Demerol75 mg IV in divided doses. Zofran 4 mg IV  DESCRIPTION OF PROCEDURE:  After a digital rectal exam was performed, the EC-[id number] ([id number])  colonoscope was advanced from the anus through the rectum and colon to the area of the cecum, ileocecal valve and appendiceal orifice.  The cecum was deeply intubated.  These structures were well-seen and photographed for the record.  From the level of the cecum and ileocecal valve, the scope was slowly and cautiously withdrawn.  The mucosal surfaces were carefully surveyed utilizing scope tip deflection to facilitate fold flattening as needed.  The scope was pulled down into the rectum where a thorough examination including retroflexion was performed.    FINDINGS:  Adequate preparation. Small external hemorrhoidal tag; otherwise, normal rectum. Patient had sigmoid and descending diverticula.  The patient had submucosal petechiae and somewhat edematous folds in a 15 cm segment of sigmoid colon. Please see above photos. There was no polyp, neoplasm  or infiltrating process seen. There were no erosions or ulcerations. The remainder of colonic mucosa appeared normal. The distal 5 cm of terminal ileum mucosa also appeared normal.  THERAPEUTIC / DIAGNOSTIC MANEUVERS PERFORMED: biopsies of the abnormal sigmoid segment taken for histologic study   COMPLICATIONS: None  CECAL WITHDRAWAL TIME:  15 minutes  IMPRESSION:  Minimal anal canal hemorrhoid. Abnormal sigmoid colon with diverticulosis and mucosal edema and submucosal petechiae. Findings more consistent with diverticular colitis rather than recent ischemic colitis or diverticulitis alone(Patient really hasn't had any typical symptoms for any of the above entities) It is notable the patient is totally asymptomatic from a GI standpoint at this time.   RECOMMENDATIONS:  Resume Effient today. Begin Benefiber 2 teaspoons daily. Followup on pathology.   _______________________________ eSigned:  R. Roetta Sessions, MD FACP [facility] 08/12/2013 10:57 AM   CC:    PATIENT NAME:  Broden, Holt MR#: [id number]

## 2013-08-12 NOTE — Interval H&P Note (Signed)
History and Physical Interval Note:  08/12/2013 10:03 AM  Bradley Hurley  has presented today for surgery, with the diagnosis of HEMATOCHEZIA  The various methods of treatment have been discussed with the patient and family. After consideration of risks, benefits and other options for treatment, the patient has consented to  Procedure(s) with comments: COLONOSCOPY (N/A) - 10:15 as a surgical intervention .  The patient's history has been reviewed, patient examined, no change in status, stable for surgery.  I have reviewed the patient's chart and labs.  Questions were answered to the patient's satisfaction.     Livio Rourk  Effient stopped 5 days ago  - not 2 days ago. Colonoscopy per plan.The risks, benefits, limitations, alternatives and imponderables have been reviewed with the patient. Questions have been answered. All parties are agreeable.

## 2013-08-12 NOTE — H&P (View-Only) (Signed)
Primary Care Physician:  [name], MD Primary Gastroenterologist:  Dr. Jena Gauss  Pre-Procedure History & Physical: HPI:  Bradley Hurley is a 67 y.o. male here for evaluation of intermittent hematochezia. Patient suffered an MI last year -  had multiple DES's placed. On Effient. Overdue to see the cardiologist. Experiencing intermittent fresh blood and clot per rectum. Feels he has prolapsing hemorrhoids. Intermittent low volume hematochezia for years a bit more these days. He was making preparation to undergo his first screening colonoscopy last year when acute cardiac events precluded this from happening. No family history of colon cancer or colon polyps. Patient denies abdominal pain,  Dysphagia, odynophagia, early satiety nausea or vomiting. He states he's lost about 8 pounds in the past 6 months.  Past Medical History  Diagnosis Date  . Coronary atherosclerosis of native coronary artery 07/2002    Previous patient of [facility]; PCI  2003-90% septal perforator, 90% proximal and mid LAD->DES x2, normal CX, luminal irregularities in RCA, normal EF  . Essential hypertension, benign   . Hyperlipidemia     Lipid profile in 03/2012:182, 846,96,295  . Obstructive sleep apnea     [facility] - 05/2011  . Tinnitus   . Erectile dysfunction     Low serum testosterone level  . Depression   . Obesity   . Deviated septum   . 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 MI (myocardial infarction)     Anterior STEMI 8/13  . CAD (coronary artery disease)     Past Surgical History  Procedure Laterality Date  . Splenectomy    . Appendectomy    . Cardiac catheterization  07/28/2012    20% distal left main, patent prox & mid LAD stents w/ 25% ISR of the latter (unchanged from 03/2012 cath, mid-LAD stent placed in 2003), 30-40% ostial LCx, 40-50% distal marginal, 30% distal serial RCA stenoses; LVEF 55% with apical HK  . Coronary angioplasty with stent placement  2003   mid LAD  . Coronary angioplasty with stent placement  03/2012    STEMI s/p DES-prox LAD    Prior to Admission medications   Medication Sig Start Date End Date Taking? Authorizing Provider  acetaminophen (TYLENOL) 500 MG tablet Take 500 mg by mouth every 6 (six) hours as needed.   Yes Historical Provider, MD  clindamycin (CLEOCIN) 150 MG capsule Take 150 mg by mouth 4 (four) times daily. 07/01/13  Yes Historical Provider, MD  EFFIENT 10 MG TABS tablet take 1 tablet by mouth once daily 05/12/13  Yes Jodelle Gross, NP  fluticasone [facility]) 50 MCG/ACT nasal spray Place 2 sprays into the nose daily as needed. For allergies   Yes Historical Provider, MD  HYDROcodone-acetaminophen (NORCO/VICODIN) 5-325 MG per tablet Take by mouth as needed. 06/23/13  Yes Historical Provider, MD  isosorbide mononitrate (IMDUR) 30 MG 24 hr tablet take 1 tablet by mouth once daily 01/29/13  Yes Pricilla Riffle, MD  lisinopril (PRINIVIL,ZESTRIL) 10 MG tablet Take 1 tablet (10 mg total) by mouth daily. 06/15/13 06/15/14 Yes Pricilla Riffle, MD  nitroGLYCERIN (NITROSTAT) 0.4 MG SL tablet Place 1 tablet (0.4 mg total) under the tongue every 5 (five) minutes as needed for chest pain. 07/30/12  Yes Kathlen Brunswick, MD  pravastatin (PRAVACHOL) 80 MG tablet Take 1 tablet (80 mg total) by mouth every evening. 09/02/12  Yes Jodelle Gross, NP  hydrochlorothiazide (HYDRODIURIL) 25 MG tablet Take 0.5 tablets (12.5 mg  total) by mouth daily. 06/02/12   Pricilla Riffle, MD  LORazepam (ATIVAN) 1 MG tablet Take 0.5 tablets (0.5 mg total) by mouth 3 (three) times daily as needed for anxiety. 06/06/12   Carleene Cooper III, MD  metoprolol tartrate (LOPRESSOR) 25 MG tablet Take 1 tablet (25 mg total) by mouth 2 (two) times daily. 04/13/12 04/13/13  Jodelle Gross, NP    Allergies as of 07/17/2013 - Review Complete 07/17/2013  Allergen Reaction Noted  . Aspirin Anaphylaxis 04/09/2012    Family History  Problem Relation Age of Onset  .  Arrhythmia Father   . Cancer Mother     History   Social History  . Marital Status: Married    Spouse Name: N/A    Number of Children: N/A  . Years of Education: N/A   Occupational History  . 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 Smoking status: Former Smoker -- 1.00 packs/day for 15 years    Types: Cigarettes  . Smokeless tobacco: Former Neurosurgeon    Quit date: 01/30/1981     Comment: Quit in 1982  . Alcohol Use: 0.5 oz/week    1 drink(s) per week     Comment: Rare consumption - once/twice per week  . Drug Use: No  . Sexual Activity: Yes   Other Topics Concern  . 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    Review of Systems: See HPI, otherwise negative ROS  Physical Exam: BP 156/83  Pulse 66  Temp(Src) 98.8 F (37.1 C) (Oral)  Ht 5\' 5"  (1.651 m)  Wt 192 lb (87.091 kg)  BMI 31.95 kg/m2 General:   Alert,  Well-developed, well-nourished, pleasant and cooperative in NAD Skin:  Intact without significant lesions or rashes. Eyes:  Sclera clear, no icterus.   Conjunctiva pink. Ears:  Normal auditory acuity. Nose:  No deformity, discharge,  or lesions. Mouth:  No deformity or lesions. Neck:  Supple; no masses or thyromegaly. No significant cervical adenopathy. Lungs:  Clear throughout to auscultation.   No wheezes, crackles, or rhonchi. No acute distress. Heart:  Regular rate and rhythm; no murmurs, clicks, rubs,  or gallops. Abdomen: Obese. Non-distended, normal bowel sounds.  Soft and nontender without appreciable mass or hepatosplenomegaly.  Pulses:  Normal pulses noted. Extremities:  Without clubbing or edema. Rectal:  Small external hemorrhoidal tag. Digital exam no mass. No stool. Mucousy strongly Hemoccult positive.   Impression:    Pleasant 67 year old gentleman with somewhat chronic intermittent rectal bleeding -  certainly worse over the past several weeks. He is Hemoccult positive on  digital rectal exam today.  He is overdue for colorectal cancer screening as well. He is on Effient given his history of stent placement.   Recommendations:  Patient needs a colonoscopy.The risks, benefits, limitations, alternatives and imponderables have been reviewed with the patient. Questions have been answered. All parties are agreeable.  Concomitant therapy with Effient would increase the risk of bleeding with polypectomy. I would like to communicate with Dr. Tenny Craw see whether or not we can interrupt Effient therapy for colonoscopy. If not, I feel we can safely proceed although removal of the larger polyps may be more problematic.  I have discussed at length with the patient today. Anticipate scheduling colonoscopy in the near future either way.

## 2013-08-12 NOTE — Interval H&P Note (Signed)
History and Physical Interval Note:  08/12/2013 9:56 AM  [name]  has presented today for surgery, with the diagnosis of HEMATOCHEZIA  The various methods of treatment have been discussed with the patient and family. After consideration of risks, benefits and other options for treatment, the patient has consented to  Procedure(s) with comments: COLONOSCOPY (N/A) - 10:15 as a surgical intervention .  The patient's history has been reviewed, patient examined, no change in status, stable for surgery.  I have reviewed the patient's chart and labs.  Questions were answered to the patient's satisfaction.     Effient stopped 2 days ago per cardiology.  No change otherwise. Colonoscopy per plan.  [name]

## 2013-08-17 ENCOUNTER — Encounter (HOSPITAL_COMMUNITY): Payer: Self-pay | Admitting: Internal Medicine

## 2014-01-19 ENCOUNTER — Telehealth: Payer: Self-pay | Admitting: *Deleted

## 2014-01-19 MED ORDER — LISINOPRIL 10 MG PO TABS: 10.0000 mg | ORAL_TABLET | Freq: Every day | ORAL | Status: DC

## 2014-01-19 MED ORDER — PRASUGREL HCL 10 MG PO TABS: 10.0000 mg | ORAL_TABLET | Freq: Every day | ORAL | Status: DC

## 2014-01-19 MED ORDER — ISOSORBIDE MONONITRATE ER 30 MG PO TB24: 30.0000 mg | ORAL_TABLET | Freq: Every day | ORAL | Status: DC

## 2014-01-19 NOTE — Telephone Encounter (Deleted)
Pt needs appointment. Was last seen in 07/2013. Was scheduled to follow up in 3 months

## 2014-01-19 NOTE — Telephone Encounter (Signed)
Medication sent via escribe.  

## 2014-01-19 NOTE — Telephone Encounter (Signed)
ISOSORBIDE MN ER 30 MG #30 EFFIENT 10 MG #30 LISINOPRIL 10 MG #30

## 2014-01-19 NOTE — Addendum Note (Signed)
Addended by: Thompson Grayer on: 01/19/2014 11:57 AM   Modules accepted: Orders

## 2014-03-02 NOTE — Progress Notes (Signed)
HPI: Mr. tutor is a 68 year old patient of Dr. Dietrich PatesPaula Ross or following for ongoing assessment and management of CAD with ST elevation MI in August of 2013, with mid LAD thrombectomy and DES x2 placed in the proximal and mid segments. He had recurrent chest pain 3 months later with a repeat cardiac catheterization, with no change in his coronary anatomy and he was recommended for continued medical therapy in addition of isosorbide. He was continued on dual antiplatelet therapy. He was last seen by Dr. Tenny Crawoss in January of 2014 and was doing well without complaint and was continued on current medical management.  Today complaining of nausea, feeling that his blood pressure has been up for the last 2 days, states he has been under a lot of stress. The patient states that he has been losing weight although he has not been trying to. He states he is taking his medications every day, but he admits to eating a lot of salty and cholesterol rich foods. He denies any chest pain. He is requesting refills on Ativan. He has not been seen by Dr. Sudie BaileyKnowlton, primary care physician in several months, he is due for labs.    Allergies  Allergen Reactions  . Aspirin Anaphylaxis    Throat closes    Current Outpatient Prescriptions  Medication Sig Dispense Refill  . acetaminophen (TYLENOL) 500 MG tablet Take 500 mg by mouth every 6 (six) hours as needed.      Marland Kitchen. atorvastatin (LIPITOR) 20 MG tablet Take 1 tablet (20 mg total) by mouth daily.  90 tablet  3  . isosorbide mononitrate (IMDUR) 30 MG 24 hr tablet Take 1 tablet (30 mg total) by mouth daily.  30 tablet  6  . LORazepam (ATIVAN) 1 MG tablet Take 0.5 tablets (0.5 mg total) by mouth 3 (three) times daily as needed for anxiety.  15 tablet  0  . nitroGLYCERIN (NITROSTAT) 0.4 MG SL tablet Place 1 tablet (0.4 mg total) under the tongue every 5 (five) minutes as needed for chest pain.  50 tablet  6  . prasugrel (EFFIENT) 10 MG TABS tablet Take 1 tablet (10 mg total)  by mouth daily.  30 tablet  6  . pravastatin (PRAVACHOL) 40 MG tablet Take 1 tablet (40 mg total) by mouth daily.  30 tablet  6   No current facility-administered medications for this visit.    Past Medical History  Diagnosis Date  . Coronary atherosclerosis of native coronary artery 07/2002    Previous patient of [facility]; PCI  2003-90% septal perforator, 90% proximal and mid LAD->DES x2, normal CX, luminal irregularities in RCA, normal EF  . Essential hypertension, benign   . Hyperlipidemia     Lipid profile in 03/2012:182, 528,[id number],48,112  . Obstructive sleep apnea     [facility] - 05/2011  . Tinnitus   . Erectile dysfunction     Low serum testosterone level  . Depression   . Obesity   . Deviated septum   . 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 MI (myocardial infarction)     Anterior STEMI 8/13  . CAD (coronary artery disease)     Past Surgical History  Procedure Laterality Date  . Splenectomy    . Appendectomy    . Cardiac catheterization  07/28/2012    20% distal left main, patent prox & mid LAD stents w/ 25% ISR of the latter (unchanged from 03/2012 cath, mid-LAD stent placed in  2003), 30-40% ostial LCx, 40-50% distal marginal, 30% distal serial RCA stenoses; LVEF 55% with apical HK  . Coronary angioplasty with stent placement  2003    mid LAD  . Coronary angioplasty with stent placement  03/2012    STEMI s/p DES-prox LAD  . Tonsillectomy    . Colonoscopy N/A 08/12/2013    Procedure: COLONOSCOPY;  Surgeon: Corbin Adeobert M Rourk, MD;  Location: [facility];  Service: Endoscopy;  Laterality: N/A;  10:15    ROS: Review of systems complete and found to be negative unless listed above PHYSICAL EXAM BP 164/84  Pulse 67  Ht 5\' 4"  (1.626 m)  Wt 190 lb (86.183 kg)  BMI 32.60 kg/m2 General: Well developed, well nourished, in no acute distress Head: Eyes PERRLA, No xanthomas.   Normal cephalic and atramatic  Lungs: Clear bilaterally to auscultation and  percussion. Heart: HRRR S1 S2, without MRG, soft S4..  Pulses are 2+ & equal.            No carotid bruit. No JVD.  No abdominal bruits. No femoral bruits. Abdomen: Bowel sounds are positive, abdomen soft and non-tender without masses or                  Hernia's noted. Msk:  Back normal, normal gait. Normal strength and tone for age. Extremities: No clubbing, cyanosis or edema.  DP +1 Neuro: Alert and oriented X 3. Psych:  Good affect, responds appropriately   ASSESSMENT AND PLAN

## 2014-03-03 ENCOUNTER — Other Ambulatory Visit: Payer: Self-pay

## 2014-03-03 ENCOUNTER — Ambulatory Visit (INDEPENDENT_AMBULATORY_CARE_PROVIDER_SITE_OTHER): Payer: Federal, State, Local not specified - PPO | Admitting: Adult Health

## 2014-03-03 ENCOUNTER — Encounter: Payer: Self-pay | Admitting: Adult Health

## 2014-03-03 VITALS — BP 164/84 | HR 67 | Ht 64.0 in | Wt 190.0 lb

## 2014-03-03 DIAGNOSIS — I2510 Atherosclerotic heart disease of native coronary artery without angina pectoris: Secondary | ICD-10-CM

## 2014-03-03 DIAGNOSIS — I2583 Coronary atherosclerosis due to lipid rich plaque: Secondary | ICD-10-CM

## 2014-03-03 DIAGNOSIS — N289 Disorder of kidney and ureter, unspecified: Secondary | ICD-10-CM | POA: Diagnosis not present

## 2014-03-03 DIAGNOSIS — E785 Hyperlipidemia, unspecified: Secondary | ICD-10-CM

## 2014-03-03 DIAGNOSIS — I10 Essential (primary) hypertension: Secondary | ICD-10-CM

## 2014-03-03 DIAGNOSIS — R7309 Other abnormal glucose: Secondary | ICD-10-CM | POA: Diagnosis not present

## 2014-03-03 LAB — CBC WITH DIFFERENTIAL/PLATELET
BASOS PCT: 1 % (ref 0–1)
Basophils Absolute: 0.1 10*3/uL (ref 0.0–0.1)
Eosinophils Absolute: 0.2 10*3/uL (ref 0.0–0.7)
Eosinophils Relative: 2 % (ref 0–5)
HCT: 41.7 % (ref 39.0–52.0)
Hemoglobin: 14.3 g/dL (ref 13.0–17.0)
LYMPHS PCT: 31 % (ref 12–46)
Lymphs Abs: 2.7 10*3/uL (ref 0.7–4.0)
MCH: 30.2 pg (ref 26.0–34.0)
MCHC: 34.3 g/dL (ref 30.0–36.0)
MCV: 88.2 fL (ref 78.0–100.0)
Monocytes Absolute: 0.9 10*3/uL (ref 0.1–1.0)
Monocytes Relative: 10 % (ref 3–12)
NEUTROS ABS: 4.8 10*3/uL (ref 1.7–7.7)
NEUTROS PCT: 56 % (ref 43–77)
Platelets: 325 10*3/uL (ref 150–400)
RBC: 4.73 MIL/uL (ref 4.22–5.81)
RDW: 15.2 % (ref 11.5–15.5)
WBC: 8.6 10*3/uL (ref 4.0–10.5)

## 2014-03-03 MED ORDER — LISINOPRIL 20 MG PO TABS: 20.0000 mg | ORAL_TABLET | Freq: Every day | ORAL | Status: DC

## 2014-03-03 MED ORDER — HYDROCHLOROTHIAZIDE 25 MG PO TABS: 25.0000 mg | ORAL_TABLET | Freq: Every day | ORAL | Status: DC

## 2014-03-03 NOTE — Assessment & Plan Note (Signed)
Most recent cardiac catheterization was completed in August of 2013, he had 25% in-stent restenosis of mid LAD stents. EF was 55% with apical hypokinesis. He was continued on medical therapy dual antiplatelet therapy. Once blood pressure is better controlled, I will consider repeating a stress test and echocardiogram if he continues to be symptomatic.

## 2014-03-03 NOTE — Assessment & Plan Note (Addendum)
The pressure is not adequately controlled. It is elevated at 164/84. I rechecked it manually in the office and found to be 180/100. He admits to eating salty foods and fast foods. Not watching what he is eating to adhere to his heart healthy diet. He states that his blood pressure is been elevated at home in the 160s 170s systolic. We will increase his lisinopril to 20 mg daily. Add HCTZ 25 mg daily. I have given him a blood pressure recording sheet in order to document blood pressures which she takes at home. He will return in a week I will see him in 2 weeks. I will check BMET for kidney function.  He states he is under a lot of stress and he feels this is contributing to his blood pressure and is requesting refills on Ativan. I deferred him to Dr. Sudie BaileyKnowlton for this medications cardiology will not be treating anxiety.

## 2014-03-03 NOTE — Patient Instructions (Addendum)
Your physician recommends that you schedule a follow-up appointment in: 1 week for a blood pressure check   Your physician recommends that you schedule a follow-up appointment in: 2 weeks with Joni ReiningKathryn Lawrence  Your physician has recommended you make the following change in your medication:   TAKE HCTZ 25 MG DAILY  LISINOPRIL 20 MG DAILY  Your physician has requested that you regularly monitor and record your blood pressure readings at home. Please use the same machine at the same time of day to check your readings and record them to bring to your BLOOD PRESSURE CHECK NEXT WEEK  Your physician recommends that you return for lab work (LFT, CBC, TSH, BMP, HGBA1C, LIPIDS)  Thank you for choosing [facility]!!

## 2014-03-03 NOTE — Assessment & Plan Note (Signed)
He is not adhering to a low cholesterol diet. He is uncertain what statin he is taking. Will check lipids and LFTs to evaluate his status. We will also check a hemoglobin A1c and a TSH as he states that he has been feeling worse, with a family history of diabetes. I will send all these labs to Dr. Sudie BaileyKnowlton.

## 2014-03-03 NOTE — Progress Notes (Deleted)
Name: Bradley Hurley    DOB: Jan 24, 1946  Age: [age] y.o.  MR#: [id number]       PCP:  Bradley Hurley      Insurance: Payor: BLUE CROSS BLUE [employer]IELD / Plan: BCBS/FEDERAL EMP PPO / Product Type: *No Product type* /   CC:    Chief Complaint  Patient presents with  . Coronary Artery Disease  . Hypertension    VS Filed Vitals:   03/03/14 1505  BP: 164/84  Pulse: 67  Height: 5\' 4"  (1.626 m)  Weight: 190 lb (86.183 kg)    Weights Current Weight  03/03/14 190 lb (86.183 kg)  08/12/13 192 lb (87.091 kg)  08/12/13 192 lb (87.091 kg)    Blood Pressure  BP Readings from Last 3 Encounters:  03/03/14 164/84  08/12/13 123/82  08/12/13 123/82     Admit date:  (Not on file) Last encounter with RMR:  Visit date not found   Allergy Aspirin  Current Outpatient Prescriptions  Medication Sig Dispense Refill  . acetaminophen (TYLENOL) 500 MG tablet Take 500 mg by mouth every 6 (six) hours as needed.      Marland Kitchen. atorvastatin (LIPITOR) 20 MG tablet Take 1 tablet (20 mg total) by mouth daily.  90 tablet  3  . isosorbide mononitrate (IMDUR) 30 MG 24 hr tablet Take 1 tablet (30 mg total) by mouth daily.  30 tablet  6  . lisinopril (PRINIVIL,ZESTRIL) 10 MG tablet Take 1 tablet (10 mg total) by mouth daily.  30 tablet  3  . LORazepam (ATIVAN) 1 MG tablet Take 0.5 tablets (0.5 mg total) by mouth 3 (three) times daily as needed for anxiety.  15 tablet  0  . nitroGLYCERIN (NITROSTAT) 0.4 MG SL tablet Place 1 tablet (0.4 mg total) under the tongue every 5 (five) minutes as needed for chest pain.  50 tablet  6  . prasugrel (EFFIENT) 10 MG TABS tablet Take 1 tablet (10 mg total) by mouth daily.  30 tablet  6  . pravastatin (PRAVACHOL) 40 MG tablet Take 1 tablet (40 mg total) by mouth daily.  30 tablet  6   No current facility-administered medications for this visit.    Discontinued Meds:    Medications Discontinued During This Encounter  Medication Reason  . fluticasone (FLONASE) 50 MCG/ACT nasal  spray Error  . polyethylene glycol-electrolytes (TRILYTE) 420 G solution Error    Patient Active Problem List   Diagnosis Date Noted  . NSTEMI (non-ST elevated myocardial infarction) 07/28/2012  . Aspirin allergy 07/28/2012  . STEMI (ST elevation myocardial infarction) 04/11/2012  . HTN (hypertension) 04/11/2012  . Hypokalemia 04/11/2012  . CAD (coronary artery disease) 04/11/2012  . Hyperlipidemia 04/11/2012    LABS    Component Value Date/Time   NA 140 07/27/2012 0501   NA 142 07/26/2012 1125   NA 139 06/06/2012 1817   K 4.1 07/27/2012 0501   K 3.5 07/26/2012 1125   K 3.6 06/06/2012 1817   CL 104 07/27/2012 0501   CL 103 07/26/2012 1125   CL 102 06/06/2012 1817   CO2 29 07/27/2012 0501   CO2 27 07/26/2012 1125   CO2 27 06/06/2012 1817   GLUCOSE 109* 07/27/2012 0501   GLUCOSE 109* 07/26/2012 1125   GLUCOSE 94 06/06/2012 1817   BUN 18 07/27/2012 0501   BUN 21 07/26/2012 1125   BUN 27* 06/06/2012 1817   CREATININE 1.00 07/27/2012 0501   CREATININE 1.15 07/26/2012 1125   CREATININE 1.24 06/06/2012 1817  CALCIUM 9.2 07/27/2012 0501   CALCIUM 9.9 07/26/2012 1125   CALCIUM 10.0 06/06/2012 1817   GFRNONAA 76* 07/27/2012 0501   GFRNONAA 65* 07/26/2012 1125   GFRNONAA 59* 06/06/2012 1817   GFRAA 89* 07/27/2012 0501   GFRAA 75* 07/26/2012 1125   GFRAA 68* 06/06/2012 1817   CMP     Component Value Date/Time   NA 140 07/27/2012 0501   K 4.1 07/27/2012 0501   CL 104 07/27/2012 0501   CO2 29 07/27/2012 0501   GLUCOSE 109* 07/27/2012 0501   BUN 18 07/27/2012 0501   CREATININE 1.00 07/27/2012 0501   CALCIUM 9.2 07/27/2012 0501   PROT 7.1 08/01/2012 1409   ALBUMIN 3.9 08/01/2012 1409   AST 18 07/20/2013 1047   ALT 14 08/01/2012 1409   ALKPHOS 36* 08/01/2012 1409   BILITOT 0.4 08/01/2012 1409   GFRNONAA 76* 07/27/2012 0501   GFRAA 89* 07/27/2012 0501       Component Value Date/Time   WBC 9.6 07/28/2012 0500   WBC 11.0* 07/27/2012 0501   WBC 8.8 07/26/2012 1125   HGB 12.3* 07/28/2012  0500   HGB 12.3* 07/27/2012 0501   HGB 14.1 07/26/2012 1125   HCT 35.5* 07/28/2012 0500   HCT 36.7* 07/27/2012 0501   HCT 40.8 07/26/2012 1125   MCV 91.0 07/28/2012 0500   MCV 90.8 07/27/2012 0501   MCV 90.9 07/26/2012 1125    Lipid Panel     Component Value Date/Time   CHOL 194 07/20/2013 1047   TRIG 76 07/20/2013 1047   HDL 44 07/20/2013 1047   CHOLHDL 4.4 07/20/2013 1047   VLDL 15 07/20/2013 1047   LDLCALC 135* 07/20/2013 1047    ABG No results found for this basename: phart, pco2, pco2art, po2, po2art, hco3, tco2, acidbasedef, o2sat     No results found for this basename: TSH   BNP (last 3 results) No results found for this basename: PROBNP,  in the last 8760 hours Cardiac Panel (last 3 results) No results found for this basename: CKTOTAL, CKMB, TROPONINI, RELINDX,  in the last 72 hours  Iron/TIBC/Ferritin/ %Sat No results found for this basename: iron, tibc, ferritin, ironpctsat     EKG Orders placed in visit on 07/30/13  . EKG 12-LEAD     Prior Assessment and Plan Problem List as of 03/03/2014     Cardiovascular and Mediastinum   STEMI (ST elevation myocardial infarction)   Last Assessment & Plan   07/30/2013 Office Visit Written 07/30/2013  3:52 PM by Jodelle Gross, NP     No recurrent complaints of chest pain. He wants to stop the Effient, but I have advised him to stay on this medication for another year, maybe longer with thrombotic STEMI. He is okay to stop Effient temporarily for 5 days, prior to colonoscopy, but he is advised to take it the day after the procedure. Will continue risk management.     HTN (hypertension)   CAD (coronary artery disease)   NSTEMI (non-ST elevated myocardial infarction)     Other   Hypokalemia   Hyperlipidemia   Last Assessment & Plan   07/30/2013 Office Visit Written 07/30/2013  3:54 PM by Jodelle Gross, NP     We reviewed his cholesterol panel. He requests a copy of it. There is some confusion as to what he should be  taking. He was recently changed to atorvastatin from pravachol by Dr. Tenny Craw. He was confused as whether to take both or one of the medications. I  have advised him to take the atorvastatin and stop the pravastatin as advised by Dr.Ross. We will check the status of his cholesterol in 3 months.    Aspirin allergy       Imaging: No results found.

## 2014-03-04 ENCOUNTER — Telehealth: Payer: Self-pay | Admitting: *Deleted

## 2014-03-04 LAB — THYROID PANEL WITH TSH
FREE THYROXINE INDEX: 3.4 (ref 1.0–3.9)
T3 Uptake: 36.8 % (ref 22.5–37.0)
T4, Total: 9.2 ug/dL (ref 5.0–12.5)
TSH: 2.358 u[IU]/mL (ref 0.350–4.500)

## 2014-03-04 LAB — HEPATIC FUNCTION PANEL
ALBUMIN: 4.1 g/dL (ref 3.5–5.2)
ALK PHOS: 46 U/L (ref 39–117)
ALT: 23 U/L (ref 0–53)
AST: 20 U/L (ref 0–37)
Bilirubin, Direct: 0.1 mg/dL (ref 0.0–0.3)
Indirect Bilirubin: 0.5 mg/dL (ref 0.2–1.2)
Total Bilirubin: 0.6 mg/dL (ref 0.2–1.2)
Total Protein: 7.5 g/dL (ref 6.0–8.3)

## 2014-03-04 LAB — LIPID PANEL
Cholesterol: 148 mg/dL (ref 0–200)
HDL: 44 mg/dL (ref >39)
LDL Cholesterol: 86 mg/dL (ref 0–99)
Total CHOL/HDL Ratio: 3.4 Ratio
Triglycerides: 92 mg/dL (ref <150)
VLDL: 18 mg/dL (ref 0–40)

## 2014-03-04 LAB — HEMOGLOBIN A1C
Hgb A1c MFr Bld: 5.8 % — ABNORMAL HIGH (ref <5.7)
MEAN PLASMA GLUCOSE: 120 mg/dL — AB (ref <117)

## 2014-03-04 LAB — BASIC METABOLIC PANEL
BUN: 19 mg/dL (ref 6–23)
CHLORIDE: 102 meq/L (ref 96–112)
CO2: 28 mEq/L (ref 19–32)
Calcium: 10 mg/dL (ref 8.4–10.5)
Creat: 0.96 mg/dL (ref 0.50–1.35)
Glucose, Bld: 87 mg/dL (ref 70–99)
POTASSIUM: 5.1 meq/L (ref 3.5–5.3)
Sodium: 137 mEq/L (ref 135–145)

## 2014-03-04 NOTE — Telephone Encounter (Signed)
Message copied by Vernon PreyBARKER, Tyus Kallam T on Thu Mar 04, 2014 12:56 PM ------      Message from: Jodelle GrossLAWRENCE, KATHRYN M      Created: Thu Mar 04, 2014 12:37 PM       Labs are normal with the exception of mildly elevated Hgb AiC.Send copy to PCP. NO changes. ------

## 2014-03-04 NOTE — Telephone Encounter (Signed)
Sent to [employer]

## 2014-03-23 ENCOUNTER — Encounter: Payer: Self-pay | Admitting: *Deleted

## 2014-03-23 ENCOUNTER — Encounter: Payer: Federal, State, Local not specified - PPO | Admitting: Adult Health

## 2014-03-23 NOTE — Progress Notes (Signed)
    Error NO SHOW 

## 2014-04-26 ENCOUNTER — Encounter: Payer: Self-pay | Admitting: *Deleted

## 2014-07-20 ENCOUNTER — Other Ambulatory Visit: Payer: Self-pay | Admitting: Adult Health

## 2014-08-05 ENCOUNTER — Encounter (HOSPITAL_COMMUNITY): Payer: Self-pay | Admitting: Cardiovascular Disease

## 2014-08-24 ENCOUNTER — Encounter: Payer: Self-pay | Admitting: *Deleted

## 2014-09-15 ENCOUNTER — Other Ambulatory Visit: Payer: Self-pay | Admitting: Adult Health

## 2014-09-15 ENCOUNTER — Other Ambulatory Visit: Payer: Self-pay | Admitting: Internal Medicine

## 2014-12-05 ENCOUNTER — Other Ambulatory Visit: Payer: Self-pay | Admitting: Adult Health

## 2015-02-08 ENCOUNTER — Encounter (HOSPITAL_COMMUNITY): Payer: Self-pay

## 2015-02-08 ENCOUNTER — Observation Stay (HOSPITAL_COMMUNITY): Payer: Medicare Other

## 2015-02-08 ENCOUNTER — Observation Stay (HOSPITAL_COMMUNITY)
Admission: EM | Admit: 2015-02-08 | Discharge: 2015-02-09 | Disposition: A | Discharge status: Home / Self Care | Payer: Medicare Other | Attending: Internal Medicine | Admitting: Internal Medicine

## 2015-02-08 ENCOUNTER — Emergency Department (HOSPITAL_COMMUNITY): Payer: Medicare Other

## 2015-02-08 DIAGNOSIS — R079 Chest pain, unspecified: Secondary | ICD-10-CM | POA: Diagnosis not present

## 2015-02-08 DIAGNOSIS — K648 Other hemorrhoids: Secondary | ICD-10-CM

## 2015-02-08 DIAGNOSIS — H9319 Tinnitus, unspecified ear: Secondary | ICD-10-CM | POA: Diagnosis not present

## 2015-02-08 DIAGNOSIS — F419 Anxiety disorder, unspecified: Secondary | ICD-10-CM

## 2015-02-08 DIAGNOSIS — R110 Nausea: Secondary | ICD-10-CM | POA: Diagnosis not present

## 2015-02-08 DIAGNOSIS — I2510 Atherosclerotic heart disease of native coronary artery without angina pectoris: Secondary | ICD-10-CM | POA: Diagnosis present

## 2015-02-08 DIAGNOSIS — N529 Male erectile dysfunction, unspecified: Secondary | ICD-10-CM | POA: Diagnosis not present

## 2015-02-08 DIAGNOSIS — F329 Major depressive disorder, single episode, unspecified: Secondary | ICD-10-CM | POA: Insufficient documentation

## 2015-02-08 DIAGNOSIS — Z79899 Other long term (current) drug therapy: Secondary | ICD-10-CM | POA: Insufficient documentation

## 2015-02-08 DIAGNOSIS — R112 Nausea with vomiting, unspecified: Secondary | ICD-10-CM | POA: Insufficient documentation

## 2015-02-08 DIAGNOSIS — E669 Obesity, unspecified: Secondary | ICD-10-CM

## 2015-02-08 DIAGNOSIS — R1011 Right upper quadrant pain: Secondary | ICD-10-CM | POA: Diagnosis not present

## 2015-02-08 DIAGNOSIS — I10 Essential (primary) hypertension: Secondary | ICD-10-CM

## 2015-02-08 DIAGNOSIS — Z9981 Dependence on supplemental oxygen: Secondary | ICD-10-CM | POA: Diagnosis not present

## 2015-02-08 DIAGNOSIS — K5790 Diverticulosis of intestine, part unspecified, without perforation or abscess without bleeding: Secondary | ICD-10-CM | POA: Diagnosis not present

## 2015-02-08 DIAGNOSIS — K8020 Calculus of gallbladder without cholecystitis without obstruction: Secondary | ICD-10-CM | POA: Diagnosis present

## 2015-02-08 DIAGNOSIS — I252 Old myocardial infarction: Secondary | ICD-10-CM | POA: Diagnosis not present

## 2015-02-08 DIAGNOSIS — Z87891 Personal history of nicotine dependence: Secondary | ICD-10-CM | POA: Insufficient documentation

## 2015-02-08 DIAGNOSIS — Z9861 Coronary angioplasty status: Secondary | ICD-10-CM | POA: Diagnosis not present

## 2015-02-08 DIAGNOSIS — G4733 Obstructive sleep apnea (adult) (pediatric): Secondary | ICD-10-CM

## 2015-02-08 DIAGNOSIS — M791 Myalgia: Secondary | ICD-10-CM | POA: Diagnosis not present

## 2015-02-08 DIAGNOSIS — J342 Deviated nasal septum: Secondary | ICD-10-CM | POA: Diagnosis not present

## 2015-02-08 DIAGNOSIS — E785 Hyperlipidemia, unspecified: Secondary | ICD-10-CM | POA: Diagnosis present

## 2015-02-08 DIAGNOSIS — K649 Unspecified hemorrhoids: Secondary | ICD-10-CM | POA: Insufficient documentation

## 2015-02-08 HISTORY — DX: Diverticulosis of intestine, part unspecified, without perforation or abscess without bleeding: K57.90

## 2015-02-08 HISTORY — DX: Calculus of gallbladder without cholecystitis without obstruction: K80.20

## 2015-02-08 HISTORY — DX: Unspecified hemorrhoids: K64.9

## 2015-02-08 HISTORY — DX: Anxiety disorder, unspecified: F41.9

## 2015-02-08 LAB — TROPONIN I
Troponin I: <0.03 ng/mL (ref <0.031)
Troponin I: <0.03 ng/mL (ref <0.031)
Troponin I: <0.03 ng/mL (ref <0.031)

## 2015-02-08 LAB — CBC WITH DIFFERENTIAL/PLATELET
BASOS PCT: 1 % (ref 0–1)
Basophils Absolute: 0.1 10*3/uL (ref 0.0–0.1)
Eosinophils Absolute: 0.6 10*3/uL (ref 0.0–0.7)
Eosinophils Relative: 7 % — ABNORMAL HIGH (ref 0–5)
HCT: 43.4 % (ref 39.0–52.0)
Hemoglobin: 14.7 g/dL (ref 13.0–17.0)
Lymphocytes Relative: 30 % (ref 12–46)
Lymphs Abs: 2.5 10*3/uL (ref 0.7–4.0)
MCH: 30.8 pg (ref 26.0–34.0)
MCHC: 33.9 g/dL (ref 30.0–36.0)
MCV: 91 fL (ref 78.0–100.0)
MONO ABS: 0.8 10*3/uL (ref 0.1–1.0)
Monocytes Relative: 10 % (ref 3–12)
NEUTROS PCT: 52 % (ref 43–77)
Neutro Abs: 4.2 10*3/uL (ref 1.7–7.7)
Platelets: 356 10*3/uL (ref 150–400)
RBC: 4.77 MIL/uL (ref 4.22–5.81)
RDW: 14.8 % (ref 11.5–15.5)
WBC: 8.2 10*3/uL (ref 4.0–10.5)

## 2015-02-08 LAB — BASIC METABOLIC PANEL
Anion gap: 8 (ref 5–15)
BUN: 15 mg/dL (ref 6–20)
CO2: 27 mmol/L (ref 22–32)
Calcium: 9 mg/dL (ref 8.9–10.3)
Chloride: 103 mmol/L (ref 101–111)
Creatinine, Ser: 0.88 mg/dL (ref 0.61–1.24)
GFR calc Af Amer: >60 mL/min (ref >60)
Glucose, Bld: 102 mg/dL — ABNORMAL HIGH (ref 65–99)
POTASSIUM: 3.9 mmol/L (ref 3.5–5.1)
SODIUM: 138 mmol/L (ref 135–145)

## 2015-02-08 LAB — LIPASE, BLOOD: Lipase: 23 U/L (ref 22–51)

## 2015-02-08 LAB — TSH: TSH: 2.006 u[IU]/mL (ref 0.350–4.500)

## 2015-02-08 MED ORDER — DIPHENHYDRAMINE HCL 25 MG PO CAPS: 25.0000 mg | ORAL_CAPSULE | Freq: Four times a day (QID) | ORAL | Status: DC | PRN

## 2015-02-08 MED ORDER — HYDROCHLOROTHIAZIDE 25 MG PO TABS
25.0000 mg | ORAL_TABLET | Freq: Every day | ORAL | Status: DC
  Administered 2015-02-09: 25 mg via ORAL
  Filled 2015-02-08 (×2): qty 1

## 2015-02-08 MED ORDER — SODIUM CHLORIDE 0.9 % IJ SOLN: 3.0000 mL | INTRAMUSCULAR | Status: DC | PRN

## 2015-02-08 MED ORDER — PRASUGREL HCL 10 MG PO TABS
10.0000 mg | ORAL_TABLET | Freq: Every day | ORAL | Status: DC
  Administered 2015-02-09: 10 mg via ORAL
  Filled 2015-02-08 (×4): qty 1

## 2015-02-08 MED ORDER — HYDRALAZINE HCL 20 MG/ML IJ SOLN: 5.0000 mg | INTRAMUSCULAR | Status: DC | PRN

## 2015-02-08 MED ORDER — SODIUM CHLORIDE 0.9 % IJ SOLN
3.0000 mL | Freq: Two times a day (BID) | INTRAMUSCULAR | Status: DC
  Administered 2015-02-08 – 2015-02-09 (×2): 3 mL via INTRAVENOUS

## 2015-02-08 MED ORDER — SODIUM CHLORIDE 0.9 % IJ SOLN
3.0000 mL | Freq: Two times a day (BID) | INTRAMUSCULAR | Status: DC
  Administered 2015-02-08: 3 mL via INTRAVENOUS

## 2015-02-08 MED ORDER — LISINOPRIL 10 MG PO TABS
20.0000 mg | ORAL_TABLET | Freq: Every day | ORAL | Status: DC
  Administered 2015-02-08 – 2015-02-09 (×2): 20 mg via ORAL
  Filled 2015-02-08 (×2): qty 2

## 2015-02-08 MED ORDER — MAGNESIUM CITRATE PO SOLN: 1.0000 | Freq: Once | ORAL | Status: AC | PRN

## 2015-02-08 MED ORDER — NITROGLYCERIN 2 % TD OINT
1.0000 [in_us] | TOPICAL_OINTMENT | Freq: Four times a day (QID) | TRANSDERMAL | Status: DC
  Administered 2015-02-08: 1 [in_us] via TOPICAL
  Filled 2015-02-08: qty 1

## 2015-02-08 MED ORDER — ONDANSETRON HCL 4 MG PO TABS: 4.0000 mg | ORAL_TABLET | Freq: Four times a day (QID) | ORAL | Status: DC | PRN

## 2015-02-08 MED ORDER — HYDROCODONE-ACETAMINOPHEN 5-325 MG PO TABS: 1.0000 | ORAL_TABLET | ORAL | Status: DC | PRN

## 2015-02-08 MED ORDER — SODIUM CHLORIDE 0.9 % IV SOLN: 250.0000 mL | INTRAVENOUS | Status: DC | PRN

## 2015-02-08 MED ORDER — BISACODYL 10 MG RE SUPP: 10.0000 mg | Freq: Every day | RECTAL | Status: DC | PRN

## 2015-02-08 MED ORDER — ACETAMINOPHEN 650 MG RE SUPP: 650.0000 mg | Freq: Four times a day (QID) | RECTAL | Status: DC | PRN

## 2015-02-08 MED ORDER — CHLORPHENIRAMINE MALEATE 4 MG PO TABS: 4.0000 mg | ORAL_TABLET | Freq: Four times a day (QID) | ORAL | Status: DC | PRN

## 2015-02-08 MED ORDER — ONDANSETRON HCL 4 MG/2ML IJ SOLN: 4.0000 mg | Freq: Four times a day (QID) | INTRAMUSCULAR | Status: DC | PRN

## 2015-02-08 MED ORDER — MORPHINE SULFATE 2 MG/ML IJ SOLN: 1.0000 mg | INTRAMUSCULAR | Status: DC | PRN

## 2015-02-08 MED ORDER — ISOSORBIDE MONONITRATE ER 60 MG PO TB24
30.0000 mg | ORAL_TABLET | Freq: Every day | ORAL | Status: DC
  Administered 2015-02-08 – 2015-02-09 (×2): 30 mg via ORAL
  Filled 2015-02-08 (×2): qty 1

## 2015-02-08 MED ORDER — ALUM & MAG HYDROXIDE-SIMETH 200-200-20 MG/5ML PO SUSP: 30.0000 mL | Freq: Four times a day (QID) | ORAL | Status: DC | PRN

## 2015-02-08 MED ORDER — NITROGLYCERIN 0.4 MG SL SUBL: 0.4000 mg | SUBLINGUAL_TABLET | SUBLINGUAL | Status: DC | PRN

## 2015-02-08 MED ORDER — ATORVASTATIN CALCIUM 20 MG PO TABS
20.0000 mg | ORAL_TABLET | Freq: Every evening | ORAL | Status: DC
  Administered 2015-02-08: 20 mg via ORAL
  Filled 2015-02-08: qty 1

## 2015-02-08 MED ORDER — ACETAMINOPHEN 325 MG PO TABS
650.0000 mg | ORAL_TABLET | Freq: Four times a day (QID) | ORAL | Status: DC | PRN
Start: 2015-02-08 — End: 2015-02-09
  Administered 2015-02-08: 650 mg via ORAL
  Filled 2015-02-08: qty 2

## 2015-02-08 MED ORDER — POLYETHYLENE GLYCOL 3350 17 G PO PACK: 17.0000 g | PACK | Freq: Every day | ORAL | Status: DC | PRN

## 2015-02-08 NOTE — ED Provider Notes (Signed)
CSN: [id number]     Arrival date & time 02/08/15  4098 History  This chart was scribed for Gilda Crease, MD by Placido Sou, ED scribe. This patient was seen in room APA14/APA14 and the patient's care was started at 9:35 AM.    Chief Complaint  Patient presents with  . Chest Pain    The history is provided by the patient and the spouse. No language interpreter was used.    HPI Comments: Bradley Hurley is a 69 y.o. male, with a history of HTN and heart attacks, who presents to the Emergency Department complaining of mild, constant, right-sided chest pain with onset 1 hour ago. He notes his last heart attack felt like "bad indigestion" and notes this pain is different but additionally mentioned that he is experiencing jaw pain and nausea similar to prior heart attacks. He denies taking his prescribed effient in over a week due to current hemorrhoidal bleeding. Pt's wife notes he was working outdoors yesterday with some minor exertion. He denies SOB.   Past Medical History  Diagnosis Date  . Coronary atherosclerosis of native coronary artery 07/2002    Previous patient of [facility]; PCI  2003-90% septal perforator, 90% proximal and mid LAD->DES x2, normal CX, luminal irregularities in RCA, normal EF  . Essential hypertension, benign   . Hyperlipidemia     Lipid profile in 03/2012:182, 119,14,782  . Obstructive sleep apnea     [facility] - 05/2011  . Tinnitus   . Erectile dysfunction     Low serum testosterone level  . Depression   . Obesity   . Deviated septum   . 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 MI (myocardial infarction)     Anterior STEMI 8/13  . CAD (coronary artery disease)    Past Surgical History  Procedure Laterality Date  . Splenectomy    . Appendectomy    . Cardiac catheterization  07/28/2012    20% distal left main, patent prox & mid LAD stents w/ 25% ISR of the latter (unchanged from 03/2012 cath, mid-LAD stent placed in  2003), 30-40% ostial LCx, 40-50% distal marginal, 30% distal serial RCA stenoses; LVEF 55% with apical HK  . Coronary angioplasty with stent placement  2003    mid LAD  . Coronary angioplasty with stent placement  03/2012    STEMI s/p DES-prox LAD  . Tonsillectomy    . Colonoscopy N/A 08/12/2013    Procedure: COLONOSCOPY;  Surgeon: Corbin Ade, MD;  [facility]: [facility];  Service: Endoscopy;  Laterality: N/A;  10:15  . Left heart catheterization with coronary angiogram N/A 04/10/2012    Procedure: LEFT HEART CATHETERIZATION WITH CORONARY ANGIOGRAM;  Surgeon: Kathleene Hazel, MD;  Location: [facility] CATH LAB;  Service: Cardiovascular;  Laterality: N/A;  . Left heart catheterization with coronary angiogram N/A 07/28/2012    Procedure: LEFT HEART CATHETERIZATION WITH CORONARY ANGIOGRAM;  Surgeon: Kathleene Hazel, MD;  Location: [facility] CATH LAB;  Service: Cardiovascular;  Laterality: N/A;   Family History  Problem Relation Age of Onset  . Arrhythmia Father   . Cancer Mother    History  Substance Use Topics  . Smoking status: Former Smoker -- 1.00 packs/day for 15 years    Types: Cigarettes  . Smokeless tobacco: Former Neurosurgeon    Quit date: 01/30/1981     Comment: Quit in 1982  . Alcohol Use: 0.5 oz/week    1 Standard drinks or  equivalent per week     Comment: occ    Review of Systems  Respiratory: Negative for shortness of breath.   Cardiovascular: Positive for chest pain.  Gastrointestinal: Positive for nausea. Negative for vomiting.  Musculoskeletal: Positive for myalgias.  All other systems reviewed and are negative.     Allergies  Aspirin  Home Medications   Prior to Admission medications   Medication Sig Start Date End Date Taking? Authorizing Provider  acetaminophen (TYLENOL) 500 MG tablet Take 500 mg by mouth every 6 (six) hours as needed for mild pain.    Yes Historical Provider, MD  atorvastatin (LIPITOR) 20 MG tablet Take 1 tablet (20 mg total) by mouth  daily. 07/30/13  Yes Jodelle Gross, NP  Chlorpheniramine Maleate (ALLERGY PO) Take 1 tablet by mouth daily as needed (allergies).   Yes Historical Provider, MD  isosorbide mononitrate (IMDUR) 30 MG 24 hr tablet take 1 tablet by mouth once daily 12/06/14  Yes Jodelle Gross, NP  lisinopril (PRINIVIL,ZESTRIL) 20 MG tablet take 1 tablet by mouth once daily 09/16/14  Yes Jodelle Gross, NP  Multiple Vitamins-Minerals (MEGA MULTI MEN PO) Take 1 tablet by mouth daily as needed (energy).   Yes Historical Provider, MD  nitroGLYCERIN (NITROSTAT) 0.4 MG SL tablet Place 1 tablet (0.4 mg total) under the tongue every 5 (five) minutes as needed for chest pain. 07/30/12  Yes Kathlen Brunswick, MD  prasugrel (EFFIENT) 10 MG TABS tablet Take 1 tablet (10 mg total) by mouth daily. 01/19/14  Yes Jodelle Gross, NP  hydrochlorothiazide (HYDRODIURIL) 25 MG tablet Take 1 tablet (25 mg total) by mouth daily. Patient not taking: Reported on 02/08/2015 03/03/14   Jodelle Gross, NP  LORazepam (ATIVAN) 1 MG tablet Take 0.5 tablets (0.5 mg total) by mouth 3 (three) times daily as needed for anxiety. Patient not taking: Reported on 02/08/2015 06/06/12   Carleene Cooper, MD  pravastatin (PRAVACHOL) 40 MG tablet Take 1 tablet (40 mg total) by mouth daily. Patient not taking: Reported on 02/08/2015 07/30/13   Jodelle Gross, NP   BP 149/89 mmHg  Pulse 68  Temp(Src) 98.5 F (36.9 C) (Oral)  Resp 19  Ht  (1.651 m)  Wt 200 lb (90.719 kg)  BMI 33.28 kg/m2  SpO2 95% Physical Exam  Constitutional: He is oriented to person, place, and time. He appears well-developed and well-nourished. No distress.  HENT:  Head: Normocephalic and atraumatic.  Right Ear: Hearing normal.  Left Ear: Hearing normal.  Nose: Nose normal.  Mouth/Throat: Oropharynx is clear and moist and mucous membranes are normal.  Eyes: Conjunctivae and EOM are normal. Pupils are equal, round, and reactive to light.  Neck: Normal range of  motion. Neck supple.  Cardiovascular: Normal rate, regular rhythm, S1 normal and S2 normal.  Exam reveals no gallop and no friction rub.   No murmur heard. Pulmonary/Chest: Effort normal and breath sounds normal. No respiratory distress. He exhibits no tenderness.  Abdominal: Soft. Normal appearance and bowel sounds are normal. There is no hepatosplenomegaly. There is no tenderness. There is no rebound, no guarding, no tenderness at McBurney's point and negative Murphy's sign. No hernia.  Musculoskeletal: Normal range of motion.  Neurological: He is alert and oriented to person, place, and time. He has normal strength. No cranial nerve deficit or sensory deficit. Coordination normal. GCS eye subscore is 4. GCS verbal subscore is 5. GCS motor subscore is 6.  Skin: Skin is warm, dry and intact. No rash  noted. No cyanosis.  Psychiatric: He has a normal mood and affect. His speech is normal and behavior is normal. Thought content normal.  Nursing note and vitals reviewed.   ED Course  Procedures  DIAGNOSTIC STUDIES: Oxygen Saturation is 98% on RA, normal by my interpretation.    COORDINATION OF CARE: 9:39 AM Discussed treatment plan with pt at bedside and pt agreed to plan.  Labs Review Labs Reviewed  CBC WITH DIFFERENTIAL/PLATELET - Abnormal; Notable for the following:    Eosinophils Relative 7 (*)    All other components within normal limits  BASIC METABOLIC PANEL - Abnormal; Notable for the following:    Glucose, Bld 102 (*)    All other components within normal limits  TROPONIN I    Imaging Review Dg Chest 2 View  02/08/2015   CLINICAL DATA:  Left side chest pain radiating into the jaw beginning this morning.  EXAM: CHEST  2 VIEW  COMPARISON:  Single view of the chest 07/26/2012 and 04/10/2012.  FINDINGS: Heart size and mediastinal contours are within normal limits. Both lungs are clear. Visualized skeletal structures are unremarkable.  IMPRESSION: No acute cardiopulmonary disease.    Electronically Signed   By: Drusilla Kanner M.D.   On: 02/08/2015 10:23     EKG Interpretation   Date/Time:  Tuesday February 08 2015 09:34:11 EDT Ventricular Rate:  71 PR Interval:  134 QRS Duration: 93 QT Interval:  398 QTC Calculation: 432 R Axis:   39 Text Interpretation:  Sinus rhythm Low voltage, precordial leads Baseline  wander in lead(s) V1 V3 V6 Otherwise within normal limits Confirmed by  Aristotelis Vilardi  MD, Ademide Schaberg [phone]) on 02/08/2015 9:44:19 AM      MDM   Final diagnoses:  Chest pain   chest pain  Uncontrolled hypertension  Presents to the emergency department for evaluation of chest pain. Pain is primarily right-sided but it does radiate up to the jawline. He has had mild nausea associated with the symptoms. Patient does have a cardiac history including MI and stenting. He reports that the pain location and quality is different than his MI, but he became concerned when he started having nausea and jaw pain because he had that with his MI. Patient has been off his Effient for 1 week because of bleeding from a hemorrhoid. He is not anemic. Vital signs are normal other than elevated blood pressure upon arrival which has resolved with Nitropaste. Patient's pain is now resolved as well. Initial workup negative including EKG and troponin. Patient will be admitted to the hospital for further management.  I personally performed the services described in this documentation, which was scribed in my presence. The recorded information has been reviewed and is accurate.      Gilda Crease, MD 02/08/15 1055

## 2015-02-08 NOTE — H&P (Signed)
[facility] History and Physical  RYDIN BRADEN QXI:[id number] DOB: October 26, 1945 DOA: 02/08/2015  Referring physician: Blinda Leatherwood PCP: [name], MD   Chief Complaint: chest pain  HPI: Bradley Hurley is a very pleasant 69 y.o. male with a past medical history that includes STEMI status post stent and mid LAD thrombectomy 2013, hypertension hyperlipidemia, obesity obstructive sleep apnea presents to the emergency department with chief complaint of chest pain. He states he was sitting his desk this morning when he developed right-sided chest "discomfort/pressure". Associated symptoms include jaw pain and nausea. He denies palpitation shortness of breath diaphoresis headache visual disturbances syncope or near-syncope. He does report experiencing jaw pain and nausea with prior heart attacks but the actual chest discomfort is different from those episodes. Describes the chest pain as a pressure/tightness located in the right anterior chest radiating up to the jaw. He rates this discomfort a 3 out of 10 and reports it being constant. He got up to take a shower and states no worsening of chest pain during this activity. He reports taking his Effient with no change. Ulcer reports taking himself off of his Effient recently due to persistent hemorrhoid bleeding. He was working outdoors yesterday without a problem. He's been diagnosed with obstructive sleep apnea but is noncompliant with C Pap machine. He denies abdominal pain constipation diarrhea dysuria. He does endorse frequency. Workup in the emergency department includes basic metabolic panel and complete blood count that are unremarkable. Chest x-ray with no acute abnormalities. Initial troponin is negative. EKG without acute changes. The emergency department he's hypertensive and provided with nitroglycerin patch at the time of my exam his blood pressures within the limits of normal. He's afebrile and not hypoxic.  Review of Systems:  10  point review of systems complete and all systems are negative except as indicated in the history of present illness  Past Medical History  Diagnosis Date  . Coronary atherosclerosis of native coronary artery 07/2002    Previous patient of [facility]; PCI  2003-90% septal perforator, 90% proximal and mid LAD->DES x2, normal CX, luminal irregularities in RCA, normal EF  . Essential hypertension, benign   . Hyperlipidemia     Lipid profile in 03/2012:182, 034,91,791  . Obstructive sleep apnea     [facility] - 05/2011  . Tinnitus   . Erectile dysfunction     Low serum testosterone level  . Depression   . Obesity   . Deviated septum   . 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 MI (myocardial infarction)     Anterior STEMI 8/13  . CAD (coronary artery disease)   . Anxiety   . Hemorrhoid   . Diverticulosis    Past Surgical History  Procedure Laterality Date  . Splenectomy    . Appendectomy    . Cardiac catheterization  07/28/2012    20% distal left main, patent prox & mid LAD stents w/ 25% ISR of the latter (unchanged from 03/2012 cath, mid-LAD stent placed in 2003), 30-40% ostial LCx, 40-50% distal marginal, 30% distal serial RCA stenoses; LVEF 55% with apical HK  . Coronary angioplasty with stent placement  2003    mid LAD  . Coronary angioplasty with stent placement  03/2012    STEMI s/p DES-prox LAD  . Tonsillectomy    . Colonoscopy N/A 08/12/2013    Procedure: COLONOSCOPY;  Surgeon: Corbin Ade, MD;  Location[facility];  Service: Endoscopy;  Laterality: N/A;  10:15  . Left heart catheterization with coronary angiogram N/A 04/10/2012    Procedure: LEFT HEART CATHETERIZATION WITH CORONARY ANGIOGRAM;  Surgeon: Kathleene Hazel, MD;  Location: [facility] CATH LAB;  Service: Cardiovascular;  Laterality: N/A;  . Left heart catheterization with coronary angiogram N/A 07/28/2012    Procedure: LEFT HEART CATHETERIZATION WITH CORONARY ANGIOGRAM;  Surgeon:  Kathleene Hazel, MD;  Location: [facility] CATH LAB;  Service: Cardiovascular;  Laterality: N/A;   Social History:  reports that he has quit smoking. His smoking use included Cigarettes. He has a 15 pack-year smoking history. He quit smokeless tobacco use about 34 years ago. He reports that he drinks about 0.5 oz of alcohol per week. He reports that he does not use illicit drugs. He's retired he lives at home with his wife he is independent with ADLs Allergies  Allergen Reactions  . Aspirin Anaphylaxis    Throat closes    Family History  Problem Relation Age of Onset  . Arrhythmia Father   . Cancer Mother      Prior to Admission medications   Medication Sig Start Date End Date Taking? Authorizing Provider  acetaminophen (TYLENOL) 500 MG tablet Take 500 mg by mouth every 6 (six) hours as needed for mild pain.    Yes Historical Provider, MD  atorvastatin (LIPITOR) 20 MG tablet Take 1 tablet (20 mg total) by mouth daily. 07/30/13  Yes Jodelle Gross, NP  Chlorpheniramine Maleate (ALLERGY PO) Take 1 tablet by mouth daily as needed (allergies).   Yes Historical Provider, MD  isosorbide mononitrate (IMDUR) 30 MG 24 hr tablet take 1 tablet by mouth once daily 12/06/14  Yes Jodelle Gross, NP  lisinopril (PRINIVIL,ZESTRIL) 20 MG tablet take 1 tablet by mouth once daily 09/16/14  Yes Jodelle Gross, NP  Multiple Vitamins-Minerals (MEGA MULTI MEN PO) Take 1 tablet by mouth daily as needed (energy).   Yes Historical Provider, MD  nitroGLYCERIN (NITROSTAT) 0.4 MG SL tablet Place 1 tablet (0.4 mg total) under the tongue every 5 (five) minutes as needed for chest pain. 07/30/12  Yes Kathlen Brunswick, MD  prasugrel (EFFIENT) 10 MG TABS tablet Take 1 tablet (10 mg total) by mouth daily. 01/19/14  Yes Jodelle Gross, NP  hydrochlorothiazide (HYDRODIURIL) 25 MG tablet Take 1 tablet (25 mg total) by mouth daily. Patient not taking: Reported on 02/08/2015 03/03/14   Jodelle Gross, NP   Physical  Exam: Filed Vitals:   02/08/15 1130 02/08/15 1145 02/08/15 1200 02/08/15 1215  BP: 155/92  167/100   Pulse: 74 72 72 76  Temp:      TempSrc:      Resp: Height:      Weight:      SpO2: 97% 98% 96% 96%    Wt Readings from Last 3 Encounters:  02/08/15 90.719 kg (200 lb)  03/03/14 86.183 kg (190 lb)  08/12/13 87.091 kg (192 lb)    General:  Appears calm and comfortable, obese Eyes: PERRL, normal lids, irises & conjunctiva ENT: grossly normal hearing, lips & tongue, mucous membranes of his mouth are pink slightly dry Neck: no LAD, masses or thyromegaly Cardiovascular: RRR, no m/r/g. No LE edema.  Respiratory: CTA bilaterally, no w/r/r. Normal respiratory effort. Abdomen: soft, ntnd positive bowel sounds no mass organomegaly noted Skin: no rash or induration seen on limited exam Musculoskeletal: grossly normal tone BUE/BLE Psychiatric: grossly normal mood and affect, speech fluent and appropriate Neurologic: grossly non-focal. Cranial nerves II  through XII grossly intact speech clear slow at times.           Labs on Admission:  Basic Metabolic Panel:  Recent Labs Lab 02/08/15 0936  NA 138  K 3.9  CL 103  CO2 27  GLUCOSE 102*  BUN 15  CREATININE 0.88  CALCIUM 9.0   Liver Function Tests: No results for input(s): AST, ALT, ALKPHOS, BILITOT, PROT, ALBUMIN in the last 168 hours. No results for input(s): LIPASE, AMYLASE in the last 168 hours. No results for input(s): AMMONIA in the last 168 hours. CBC:  Recent Labs Lab 02/08/15 0936  WBC 8.2  NEUTROABS 4.2  HGB 14.7  HCT 43.4  MCV 91.0  PLT 356   Cardiac Enzymes:  Recent Labs Lab 02/08/15 0936  TROPONINI <0.03    BNP (last 3 results) No results for input(s): BNP in the last 8760 hours.  ProBNP (last 3 results) No results for input(s): PROBNP in the last 8760 hours.  CBG: No results for input(s): GLUCAP in the last 168 hours.  Radiological Exams on Admission: Dg Chest 2  View  02/08/2015   CLINICAL DATA:  Left side chest pain radiating into the jaw beginning this morning.  EXAM: CHEST  2 VIEW  COMPARISON:  Single view of the chest 07/26/2012 and 04/10/2012.  FINDINGS: Heart size and mediastinal contours are within normal limits. Both lungs are clear. Visualized skeletal structures are unremarkable.  IMPRESSION: No acute cardiopulmonary disease.   Electronically Signed   By: Drusilla Kanner M.D.   On: 02/08/2015 10:23    EKG:   Assessment/Plan Principal Problem:   Chest pain at rest: Some typical and atypical features in patient with history of CAD and anterior STEMI 3 years ago. Heart score 5.  Will admit to telemetry. We'll cycle troponins get serial EKGs. Patient is allergic to aspirin we'll continue his Effient. Will obtain a lipid panel. Continue his statin. Of note patient was seen by cardiology 11 months ago. Note indicates poorly controlled blood pressure and tentative plan for echo and stress test once blood pressure controlled. Patient lost to follow-up. I will obtain 2-D echo for completeness. Will request cardiology consult. May benefit from inpatient stress test. Suspect compliance issue with this patient  Active Problems:  RUQ pain: with one episode of emesis. Some tenderness on exam. Still has his gallbladder. Will obtain abdominal US and obtain LFT's    HTN (hypertension): Poor control. Unclear if he is compliant with his medications. I medications include Imdur or lisinopril hydrochlorothiazide. I will continue these and provide when necessary hydralazine.    CAD (coronary artery disease): See #1. Chart review indicates most recent cath 03/2012 with 25% in-stent restenosis. EF 55% with hypokinesis.  He is not on aspirin due to allergy. He interrupted his Effient for a week due to hemorrhoids.    Hyperlipidemia: Obtain a lipid panel. Continue statin. Check LFTs    Obstructive sleep apnea: Noncompliant for over one year. Reports equipment failure  and inconvenience. Will request case management consult to assist patient with obtaining new equipment.   Code Status: full code DVT Prophylaxis: Family Communication: wife son at bedside Disposition Plan: home hopefully 24 hours  Time spent: 65 minutes  [facility] Pager [phone]

## 2015-02-08 NOTE — ED Notes (Signed)
Pt reports right sided chest pain onset this am. Pt reports has 4 stents and MI in the past. Pt reports nausea. Mildly diaphoretic.

## 2015-02-08 NOTE — ED Notes (Signed)
Hospitalist at bedside at this time 

## 2015-02-08 NOTE — ED Notes (Signed)
Report given to [name], RN for room 302.

## 2015-02-09 ENCOUNTER — Observation Stay (HOSPITAL_BASED_OUTPATIENT_CLINIC_OR_DEPARTMENT_OTHER): Payer: Medicare Other

## 2015-02-09 ENCOUNTER — Encounter (HOSPITAL_COMMUNITY): Payer: Self-pay | Admitting: Internal Medicine

## 2015-02-09 ENCOUNTER — Observation Stay (HOSPITAL_COMMUNITY): Payer: Medicare Other

## 2015-02-09 DIAGNOSIS — R072 Precordial pain: Secondary | ICD-10-CM

## 2015-02-09 DIAGNOSIS — R079 Chest pain, unspecified: Secondary | ICD-10-CM

## 2015-02-09 DIAGNOSIS — K8020 Calculus of gallbladder without cholecystitis without obstruction: Secondary | ICD-10-CM | POA: Diagnosis present

## 2015-02-09 DIAGNOSIS — E785 Hyperlipidemia, unspecified: Secondary | ICD-10-CM | POA: Diagnosis not present

## 2015-02-09 DIAGNOSIS — I25119 Atherosclerotic heart disease of native coronary artery with unspecified angina pectoris: Secondary | ICD-10-CM

## 2015-02-09 DIAGNOSIS — I10 Essential (primary) hypertension: Secondary | ICD-10-CM | POA: Diagnosis not present

## 2015-02-09 LAB — HEMOGLOBIN A1C
Hgb A1c MFr Bld: 5.9 % — ABNORMAL HIGH (ref 4.8–5.6)
Mean Plasma Glucose: 123 mg/dL

## 2015-02-09 MED ORDER — ENOXAPARIN SODIUM 40 MG/0.4ML ~~LOC~~ SOLN
40.0000 mg | Freq: Every day | SUBCUTANEOUS | Status: DC
  Administered 2015-02-09: 40 mg via SUBCUTANEOUS
  Filled 2015-02-09: qty 0.4

## 2015-02-09 NOTE — Progress Notes (Addendum)
2D Echo with normal EF, grade 1 diastolic dysfunction, mild LAE, mild MR, no regional wall motion abnormalities. Patient is set up for a Lexiscan myoview on Monday 02/14/15, arrive to radiology at 8am. Then he will f/u with Joni Reining NP on 02/18/15 at 1:50pm. Patient and primary team aware. Would continue current med regimen for now which includes resumption of Effient and HCTZ as he was supposed to be taking. (See consult note.) Instructed patient to follow BP at home in the interim before appts and call if running higher than 130/80. Stress test instructions attached to discharge section. Kelcey Korus PA-C

## 2015-02-09 NOTE — Care Management Note (Signed)
Case Management Note  Patient Details  Name: MAKYLE JASKOLKA MRN: [id number] Date of Birth: 04/22/1946  Subjective/Objective:                  Pt admitted from home with CP. Pt lives with family and will return home at discharge. Pt is independent with ADL's. Pt has a cpap machine from [facility] and pt asked about getting new mask for machine. Instructed pt to call [facility] and they will assist him with new mask.  Action/Plan: No CM needs noted.  Expected Discharge Date:                  Expected Discharge Plan:  Home/Self Care  In-House Referral:  NA  Discharge planning Services  CM Consult  Post Acute Care Choice:  NA Choice offered to:  NA  DME Arranged:    DME Agency:     HH Arranged:    HH Agency:     Status of Service:  Completed, signed off  Medicare Important Message Given:    Date Medicare IM Given:    Medicare IM give by:    Date Additional Medicare IM Given:    Additional Medicare Important Message give by:     If discussed at Long Length of Stay Meetings, dates discussed:    Additional Comments:  Cheryl Flash, RN 02/09/2015, 11:32 AM

## 2015-02-09 NOTE — Progress Notes (Signed)
Patient has no order for VTE prophylaxis. Dr. Ardyth Harps notified.

## 2015-02-09 NOTE — Discharge Instructions (Signed)
° °  You have a Stress Test scheduled at [facility]. Your doctor has ordered this test to get a better idea of how your heart works.  Location: [facility]  Instructions:  No food/drink after midnight the night before.   No caffeine/decaf products 24 hours before, including medicines such as Excedrin or Goody Powders. Call if there are any questions.   Wear comfortable clothes and shoes.   It is OK to take your morning meds with a sip of water EXCEPT for those types of medicines listed below or otherwise instructed.  Special Medication Instructions:  Beta blockers such as metoprolol (Lopressor/Toprol XL), atenolol (Tenormin), carvedilol (Coreg), nebivolol (Bystolic), propranolol (Inderal) should not be taken for 24 hours before the test.  Calcium channel blockers such as diltiazem (Cardizem) or verapmil (Calan) should not be taken for 24 hours before the test.  Remove nitroglycerin patches and do not take nitrate preparations such as Imdur/isosorbide the day of your test.  No Persantine/Theophylline or Aggrenox medicines should be used within 24 hours of the test.   What To Expect: The whole test will take several hours. When you arrive in the lab, the technician will inject a small amount of radioactive tracer into your arm through an IV while you are resting quietly. This helps [place] to form pictures of your heart. You will likely only feel a sting from the IV. After a waiting period, resting pictures will be obtained under a big camera. These are the "before" pictures.  Next, you will be prepped for the stress portion of the test. This may include either walking on a treadmill or receiving a medicine that helps to dilate blood vessels in your heart to simulate the effect of exercise on your heart. If you are walking on a treadmill, you will walk at different paces to try to get your heart rate to a goal number that is based on your age. If your doctor has  chosen the pharmacologic test, then you will receive a medicine through your IV that may cause temporary nausea, flushing, shortness of breath and sometimes chest discomfort or vomiting. This is typically short-lived and usually resolves quickly. Your blood pressure and heart rate will be monitored, and we will be watching your EKG on a computer screen for any changes. During this portion of the test, the radiologist will inject another small amount of radioactive tracer into your IV. After a waiting period, you will undergo a second set of pictures. These are the "after" pictures.  The doctor reading the test will compare the before-and-after images to look for evidence of heart blockages or heart weakness. In certain instances, this test is done over 2 days but usually only takes 1 day to complete.

## 2015-02-09 NOTE — Progress Notes (Signed)
Patient with orders to be discharge home. Discharge instruction given, patient verbalized understanding. Patient stable. Patient left in private vehicle with family.  

## 2015-02-09 NOTE — Consult Note (Signed)
Cardiology Consultation Note  Patient ID: Bradley Hurley, MRN: [id number], DOB/AGE: 05/04/1946 69 y.o. Admit date: 02/08/2015   Date of Consult: 02/09/2015 Primary Physician: [name], MD Primary Cardiologist: Dr. Tenny Craw [name] Reason for Consultation: chest tightness  HPI: Bradley Hurley is a 69 y/o M with history of CAD (STEMI 03/2012 s/p mLAD thrombectomy and DES x2 placed in the proximal and mid segments), HTN, OSA, HLD, depression, obesity, ICM (EF 40% by cath 03/2012, 55% in 07/2012), prior tobacco abuse, and aspirin allergy who presented to [facility] with chest tightness. A few months after his MI in 07/2012 he had recurrent chest pain and mild troponin elevation - repeat cath showed patent stents in the proximal and mid LAD with diffuse small vessel disease in LAD and circumflex, LVEF 55%. Imdur was added for anti-anginal benefit. He was last seen in 02/2014 by Joni Reining NP who was helping to address his blood pressure and risk factors at that time, however, the patient did not follow up as directed.  He has overall been doing well recently. He has not taken his Effient in 3 days, no particular reason stated. He is aspirin allergic. Yesterday while working on the computer he developed mild right sided chest tightness at rest. He was not doing anything in particular at the time other than sitting. At first it was not associated with any symptoms. He went and took a shower and felt somewhat nauseated. At that point he decided he should proceed to the hospital. He took an Effient. He did not have any NTG. Gradually the pain resolved and he no longer had symptoms in the ER. No SOB, diaphoresis, palpitations. No fevers, chills, LEE, orthopnea, weight changes or bleeding. His symptoms reminded him of an episode of "angina attack" he had 3 years before his MI that was also associated with increased salivation, but did not really feel like his 03/2012 MI pain. He  does not exercise but does push mow his lawn. He was able to do so the day before yesterday without any CP, SOB or limiting factors. He has remained pain free overnight. Pain was not worse with exertion, inspiration, palpation or movement.  Labs generally benign - negative troponin x3, normal lipase, normal CBC, BMET. CXR benign. BP 199/97 on arrival, improved to 130s-150s this AM. (One outlying BP of 96/51, unclear if accurate). He was not taking HCTZ as an outpatient. This has been resumed.   Past Medical History  Diagnosis Date  . Coronary atherosclerosis of native coronary artery 07/2002    Previous patient of [facility]; PCI  2003-90% septal perforator, 90% proximal and mid LAD->DES x2, normal CX, luminal irregularities in RCA, normal EF  . Essential hypertension, benign   . Hyperlipidemia     Lipid profile in 03/2012:182, 409,81,191  . Obstructive sleep apnea     [facility] - 05/2011  . Tinnitus   . Erectile dysfunction     Low serum testosterone level  . Depression   . Obesity   . Deviated septum   . 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 MI (myocardial infarction)     Anterior STEMI 8/13  . CAD (coronary artery disease)   . Anxiety   . Hemorrhoid   . Diverticulosis       Most Recent Cardiac Studies: 2D echo pending   Surgical History:  Past Surgical History  Procedure Laterality Date  . Splenectomy    .  Appendectomy    . Cardiac catheterization  07/28/2012    20% distal left main, patent prox & mid LAD stents w/ 25% ISR of the latter (unchanged from 03/2012 cath, mid-LAD stent placed in 2003), 30-40% ostial LCx, 40-50% distal marginal, 30% distal serial RCA stenoses; LVEF 55% with apical HK  . Coronary angioplasty with stent placement  2003    mid LAD  . Coronary angioplasty with stent placement  03/2012    STEMI s/p DES-prox LAD  . Tonsillectomy    . Colonoscopy N/A 08/12/2013    Procedure: COLONOSCOPY;  Surgeon: Corbin Ade, MD;   [facility];  Service: Endoscopy;  Laterality: N/A;  10:15  . Left heart catheterization with coronary angiogram N/A 04/10/2012    Procedure: LEFT HEART CATHETERIZATION WITH CORONARY ANGIOGRAM;  Surgeon: Kathleene Hazel, MD;  Location: [facility] CATH LAB;  Service: Cardiovascular;  Laterality: N/A;  . Left heart catheterization with coronary angiogram N/A 07/28/2012    Procedure: LEFT HEART CATHETERIZATION WITH CORONARY ANGIOGRAM;  Surgeon: Kathleene Hazel, MD;  Location: [facility] C[facility] LAB;  Service: Cardiovascular;  Laterality: N/A;     Home Meds: Prior to Admission medications   Medication Sig Start Date End Date Taking? Authorizing Provider  acetaminophen (TYLENOL) 500 MG tablet Take 500 mg by mouth every 6 (six) hours as needed for mild pain.    Yes Historical Provider, MD  atorvastatin (LIPITOR) 20 MG tablet Take 1 tablet (20 mg total) by mouth daily. 07/30/13  Yes Jodelle Gross, NP  Chlorpheniramine Maleate (ALLERGY PO) Take 1 tablet by mouth daily as needed (allergies).   Yes Historical Provider, MD  isosorbide mononitrate (IMDUR) 30 MG 24 hr tablet take 1 tablet by mouth once daily 12/06/14  Yes Jodelle Gross, NP  lisinopril (PRINIVIL,ZESTRIL) 20 MG tablet take 1 tablet by mouth once daily 09/16/14  Yes Jodelle Gross, NP  Multiple Vitamins-Minerals (MEGA MULTI MEN PO) Take 1 tablet by mouth daily as needed (energy).   Yes Historical Provider, MD  nitroGLYCERIN (NITROSTAT) 0.4 MG SL tablet Place 1 tablet (0.4 mg total) under the tongue every 5 (five) minutes as needed for chest pain. 07/30/12  Yes Kathlen Brunswick, MD  prasugrel (EFFIENT) 10 MG TABS tablet Take 1 tablet (10 mg total) by mouth daily. 01/19/14  Yes Jodelle Gross, NP  hydrochlorothiazide (HYDRODIURIL) 25 MG tablet Take 1 tablet (25 mg total) by mouth daily. Patient not taking: Reported on 02/08/2015 03/03/14   Jodelle Gross, NP    Inpatient Medications:  . atorvastatin  20 mg Oral QPM  .  hydrochlorothiazide  25 mg Oral Daily  . isosorbide mononitrate  30 mg Oral Daily  . lisinopril  20 mg Oral Daily  . prasugrel  10 mg Oral Daily  . sodium chloride  3 mL Intravenous Q12H  . sodium chloride  3 mL Intravenous Q12H      Allergies:  Allergies  Allergen Reactions  . Aspirin Anaphylaxis    Throat closes    History   Social History  . Marital Status: Married    Spouse Name: N/A  . Number of Children: N/A  . Years of Education: N/A   Occupational History  . 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 Smoking status: Former Smoker -- 1.00 packs/day for 15 years    Types: Cigarettes  . Smokeless tobacco: Former Neurosurgeon    Quit date: 01/30/1981     Comment:  Quit in 1982  . Alcohol Use: 0.5 oz/week    1 Standard drinks or equivalent per week     Comment: occ  . Drug Use: No  . Sexual Activity: Yes   Other Topics Concern  . 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     Family History  Problem Relation Age of Onset  . Arrhythmia Father   . Cancer Mother      Review of Systems: No cough. All other systems reviewed and are otherwise negative except as noted above.  Labs:  Recent Labs  02/08/15 0936 02/08/15 1543 02/08/15 2249  TROPONINI <0.03 <0.03 <0.03   Lab Results  Component Value Date   WBC 8.2 02/08/2015   HGB 14.7 02/08/2015   HCT 43.4 02/08/2015   MCV 91.0 02/08/2015   PLT 356 02/08/2015    Recent Labs Lab 02/08/15 0936  NA 138  K 3.9  CL 103  CO2 27  BUN 15  CREATININE 0.88  CALCIUM 9.0  GLUCOSE 102*   Lab Results  Component Value Date   CHOL 148 03/03/2014   HDL 44 03/03/2014   LDLCALC 86 03/03/2014   TRIG 92 03/03/2014    Radiology/Studies:  Dg Chest 2 View  02/08/2015   CLINICAL DATA:  Left side chest pain radiating into the jaw beginning this morning.  EXAM: CHEST  2 VIEW  COMPARISON:  Single view of the chest 07/26/2012 and 04/10/2012.  FINDINGS:  Heart size and mediastinal contours are within normal limits. Both lungs are clear. Visualized skeletal structures are unremarkable.  IMPRESSION: No acute cardiopulmonary disease.   Electronically Signed   By: Drusilla Kanner M.D.   On: 02/08/2015 10:23    Wt Readings from Last 3 Encounters:  02/09/15 190 lb (86.183 kg)  03/03/14 190 lb (86.183 kg)  08/12/13 192 lb (87.091 kg)    EKG:NSR 74bpm, nonspecific ST upsloping inferior, TWI avL. Unchanged from prior.  Physical Exam: Blood pressure 158/84, pulse 66, temperature 98.2 F (36.8 C), temperature source Oral, resp. rate 20, height 5\' 5"  (1.651 m), weight 190 lb (86.183 kg), SpO2 98 %. General: Well developed, well nourished WM, in no acute distress. Head: Normocephalic, atraumatic, sclera non-icteric, no xanthomas, nares are without discharge.  Neck: Negative for carotid bruits. JVD not elevated. Lungs: Clear bilaterally to auscultation without wheezes, rales, or rhonchi. Breathing is unlabored. Heart: RRR with S1 S2. No murmurs, rubs, or gallops appreciated. Abdomen: Soft, non-tender, non-distended with normoactive bowel sounds. No hepatomegaly. No rebound/guarding. No obvious abdominal masses. Msk:  Strength and tone appear normal for age. Extremities: No clubbing or cyanosis. No edema.  Distal pedal pulses are 2+ and equal bilaterally. Neuro: Alert and oriented X 3. No facial asymmetry. No focal deficit. Moves all extremities spontaneously. Psych:  Responds to questions appropriately with a normal affect.    Assessment and Plan:   1. Chest pain, without objective evidence for ischemia 2. CAD STEMI 03/2012 s/p mLAD thrombectomy and DES x2 placed in the proximal and mid segments 3. ICM EF 40% at time of MI in 03/2012, recovered since then 4. Essential HTN 5. Hyperlipidemia  Symptoms are somewhat atypical. No objective evidence of ischemia thus far. Will discuss further ischemic workup with MD. Await echocardiogram results. If no  significant changes from prior, suspect he will be a candidate for discharge with outpatient follow-up +/- nuclear stress testing. Will need close follow-up of BP since it was high on admission in  the setting of not taking HCTZ as previously prescribed. May need further titration of meds as outpatient.  Effient is continued as of now (since he has aspirin allergy), but we may be able to switch to Plavix for cost reasons - will await discussion between Dr. Diona Browner and the patient regarding this.  SignedRonie Spies PA-C 02/09/2015, 10:10 AM Pager: [phone]  Attending note:  Patient seen and examined. Reviewed records and discussed the case with Ms. Jetta Lout. I agree with her evaluation. Mr. Aylsworth presents to the hospital for further evaluation of recent onset chest discomfort. He experienced a vague right-sided chest tightness without any obvious precipitant, no shortness of breath at that time, but subsequently felt nauseated when he was in the shower. He did not have any nitroglycerin available. Only recent medication change is that he had been out of Effient for a few days. He states that the symptoms reminded him somewhat of angina, but were not completely typical. Last cardiac catheterization was in 2013 at which time he had patent stent sites within the LAD, otherwise small vessel disease. Under observation he has had no further chest pain symptoms and cardiac enzymes are normal. ECG shows non-specific ST segment changes which are old. Echocardiogram has been ordered by the primary team. We will follow-up on this study. Unless there has been a significant change in LVEF or new wall motion abnormalities, he can most likely be discharged for a follow-up outpatient Lexiscan Cardiolite on medical therapy, and an office visit with Ms. Lawrence NP over the next few weeks. He has an aspirin allergy, and therefore would continue Effient long-term. Might actually be considered for a change to generic  Plavix, although this can be further discussed as an outpatient.  Jonelle Sidle, M.D., F.A.C.C.

## 2015-02-09 NOTE — Discharge Summary (Signed)
Physician Discharge Summary  Bradley Hurley ZOX:[id number] DOB: 10-Mar-1946 DOA: 02/08/2015  PCP: [name], MD  Admit date: 02/08/2015 Discharge date: 02/09/2015  Time spent: 40 minutes  Recommendations for Outpatient Follow-up:  1. OP stress test scheduled 02/14/15 2. Follow up with Joni Reining 02/18/15 evaluation of BP control, chest pain, stress test results, compliance with CPAP 3. Recommend follow up with PCP for evaluation of gall stone   Discharge Diagnoses:  Principal Problem:   Chest pain at rest Active Problems:   HTN (hypertension)   CAD (coronary artery disease)   Hyperlipidemia   Obstructive sleep apnea   Chest pain   RUQ abdominal pain   Cholelithiasis   Discharge Condition: stable  Diet recommendation: heart healthy  Filed Weights   02/08/15 0936 02/08/15 1501 02/09/15 0603  Weight: 90.719 kg (200 lb) 88.451 kg (195 lb) 86.183 kg (190 lb)    History of present illness:  Bradley Hurley is a very pleasant 69 y.o. male with a past medical history that includes STEMI status post stent and mid LAD thrombectomy 2013, hypertension hyperlipidemia, obesity obstructive sleep apnea presented to the emergency department on 02/08/15 with chief complaint of chest pain. He stated he was sitting his desk when he developed right-sided chest "discomfort/pressure". Associated symptoms included jaw pain and nausea. He denied palpitation shortness of breath diaphoresis headache visual disturbances syncope or near-syncope. He reported experiencing jaw pain and nausea with prior heart attacks but the actual chest discomfort was different from those episodes. Described the chest pain as a pressure/tightness located in the right anterior chest radiating up to the jaw. He rated this discomfort a 3 out of 10 and reported it being constant. He got up to take a shower and stated no worsening of chest pain during this activity. He reported taking his Effient with no change. He also  reported taking himself off of his Effient recently due to persistent hemorrhoid bleeding. He was working outdoors the day prior without a problem. He'd been diagnosed with obstructive sleep apnea but  noncompliant with C Pap machine. He denied abdominal pain constipation diarrhea dysuria. He reported urinary frequency.  Workup in the emergency department included basic metabolic panel and complete blood count that were unremarkable. Chest x-ray with no acute abnormalities. Initial troponin  negative. EKG without acute changes. The emergency department he was hypertensive and provided with nitroglycerin patch at the time of my exam his blood pressures within the limits of normal. He was afebrile and not hypoxic.  Hospital Course:  Chest pain at rest: Some typical and atypical features in patient with history of CAD and anterior STEMI 3 years ago. Heart score 5. ruled out for ACS.2-D echo yields EF 55% grade 1 diastolic dysfunction. Evaluated by cardiology who recommend OP lexiscan which is scheduled 02/14/15. Patient is allergic to aspirin so continue his Effient.   Active Problems:  RUQ pain: with one episode of emesis. Resolved at discharge. Tolerating diet well.  LFT's within limits of normal. Abdominal US with Mild to moderate cholelithiasis without additional sonographic evidence to suggest cholecystitis. Follow up with PCP   HTN (hypertension): Poor control. He stopped his HCTZ and this was resumed. [facility] up [facility] as scheduled   CAD (coronary artery disease): See #1.     Obstructive sleep apnea: Noncompliant for over one year. Reports equipment failure and inconvenience. Advised to call for new equipment. .  Procedures:  Echo:Wall thickness was increased in a pattern of mild LVH. Systolic function was normal.  The estimated ejection fraction was in the range of 60% to 65%. Wall motion was normal; there were no regional wall motion abnormalities. Doppler  parameters are consistent with abnormal left ventricular relaxation (grade 1 diastolic dysfunction  Consultations:  cardiology  Discharge Exam: Filed Vitals:   02/09/15 1410  BP: 130/64  Pulse: 60  Temp: 98.7 F (37.1 C)  Resp: 20    General: well nourished appears comfortable Cardiovascular: RRR no MGR no LE edema Respiratory: normal effort BS clear  Discharge Instructions    Current Discharge Medication List    CONTINUE these medications which have NOT CHANGED   Details  acetaminophen (TYLENOL) 500 MG tablet Take 500 mg by mouth every 6 (six) hours as needed for mild pain.     atorvastatin (LIPITOR) 20 MG tablet Take 1 tablet (20 mg total) by mouth daily. Qty: 90 tablet, Refills: 3   Associated Diagnoses: Pure hypercholesterolemia    Chlorpheniramine Maleate (ALLERGY PO) Take 1 tablet by mouth daily as needed (allergies).    isosorbide mononitrate (IMDUR) 30 MG 24 hr tablet take 1 tablet by mouth once daily Qty: 30 tablet, Refills: 6    lisinopril (PRINIVIL,ZESTRIL) 20 MG tablet take 1 tablet by mouth once daily Qty: 30 tablet, Refills: 3    Multiple Vitamins-Minerals (MEGA MULTI MEN PO) Take 1 tablet by mouth daily as needed (energy).    nitroGLYCERIN (NITROSTAT) 0.4 MG SL tablet Place 1 tablet (0.4 mg total) under the tongue every 5 (five) minutes as needed for chest pain. Qty: 50 tablet, Refills: 6    prasugrel (EFFIENT) 10 MG TABS tablet Take 1 tablet (10 mg total) by mouth daily. Qty: 30 tablet, Refills: 6    hydrochlorothiazide (HYDRODIURIL) 25 MG tablet Take 1 tablet (25 mg total) by mouth daily. Qty: 90 tablet, Refills: 3       Allergies  Allergen Reactions  . Aspirin Anaphylaxis    Throat closes   Follow-up Information    Follow up with [facility].   Why:  Nuclear stress test - Monday 02/14/15. Report to radiology at 8am. Nothing to eat or drink after midnight before the test.   Contact information:   [address] [place] [phone]      Follow up with Joni Reining, NP.   Specialties:  Nurse Practitioner, Radiology, Cardiology   Why:  [facility] [place] - 02/18/15 at 1:50pm.   Contact information:   [address] [place] [address] [phone]       Follow up with [facility] [place].   Specialty:  Cardiology   Why:  Please monitor your blood pressure occasionally at home. Call your doctor if you tend to get readings of greater than 130 on the top number or 80 on the bottom number.   Contact information:   [address] [place] [address] [phone]       The results of significant diagnostics from this hospitalization (including imaging, microbiology, ancillary and laboratory) are listed below for reference.    Significant Diagnostic Studies: Dg Chest 2 View  02/08/2015   CLINICAL DATA:  Left side chest pain radiating into the jaw beginning this morning.  EXAM: CHEST  2 VIEW  COMPARISON:  Single view of the chest 07/26/2012 and 04/10/2012.  FINDINGS: Heart size and mediastinal contours are within normal limits. Both lungs are clear. Visualized skeletal structures are unremarkable.  IMPRESSION: No acute cardiopulmonary disease.   Electronically Signed   By: Maisie Fus  Dalessio M.D.   On: 02/08/2015 10:23   US Abdomen Limited Ruq  02/09/2015   CLINICAL DATA:  Right upper quadrant pain 2 months.  EXAM: US ABDOMEN LIMITED - RIGHT UPPER QUADRANT  COMPARISON:  None.  FINDINGS: Gallbladder:  Mild to moderate cholelithiasis. Mild amount of associated gallbladder sludge. No significant gallbladder wall thickening as the wall measures 2.9 mm. Negative sonographic Murphy's sign.  Common bile duct:  Diameter: 5.6 mm.  Liver:  No focal lesion identified. Within normal limits in parenchymal echogenicity.  IMPRESSION: Mild to moderate cholelithiasis without additional sonographic evidence to suggest cholecystitis.   Electronically Signed   By: Elberta Fortis M.D.    On: 02/09/2015 13:03    Microbiology: No results found for this or any previous visit (from the past 240 hour(s)).   Labs: Basic Metabolic Panel:  Recent Labs Lab 02/08/15 0936  NA 138  K 3.9  CL 103  CO2 27  GLUCOSE 102*  BUN 15  CREATININE 0.88  CALCIUM 9.0   Liver Function Tests: No results for input(s): AST, ALT, ALKPHOS, BILITOT, PROT, ALBUMIN in the last 168 hours.  Recent Labs Lab 02/08/15 0936  LIPASE 23   No results for input(s): AMMONIA in the last 168 hours. CBC:  Recent Labs Lab 02/08/15 0936  WBC 8.2  NEUTROABS 4.2  HGB 14.7  HCT 43.4  MCV 91.0  PLT 356   Cardiac Enzymes:  Recent Labs Lab 02/08/15 0936 02/08/15 1543 02/08/15 2249  TROPONINI <0.03 <0.03 <0.03   BNP: BNP (last 3 results) No results for input(s): BNP in the last 8760 hours.  ProBNP (last 3 results) No results for input(s): PROBNP in the last 8760 hours.  CBG: No results for input(s): GLUCAP in the last 168 hours.     SignedGwenyth Bender  [facility] 02/09/2015, 2:24 PM

## 2015-02-10 ENCOUNTER — Other Ambulatory Visit: Payer: Self-pay | Admitting: Physician Assistant

## 2015-02-10 DIAGNOSIS — R079 Chest pain, unspecified: Secondary | ICD-10-CM

## 2015-02-14 ENCOUNTER — Encounter (HOSPITAL_COMMUNITY)
Admission: RE | Admit: 2015-02-14 | Discharge: 2015-02-14 | Disposition: A | Discharge status: Home / Self Care | Payer: Medicare Other | Source: Ambulatory Visit | Attending: Physician Assistant | Admitting: Physician Assistant

## 2015-02-14 ENCOUNTER — Encounter (HOSPITAL_COMMUNITY): Payer: Self-pay

## 2015-02-14 ENCOUNTER — Ambulatory Visit (HOSPITAL_COMMUNITY)
Admit: 2015-02-14 | Discharge: 2015-02-14 | Disposition: A | Discharge status: Home / Self Care | Payer: Federal, State, Local not specified - PPO | Attending: Physician Assistant | Admitting: Physician Assistant

## 2015-02-14 DIAGNOSIS — R079 Chest pain, unspecified: Secondary | ICD-10-CM

## 2015-02-14 MED ORDER — REGADENOSON 0.4 MG/5ML IV SOLN
0.4000 mg | Freq: Once | INTRAVENOUS | Status: AC
  Administered 2015-02-14: 0.4 mg via INTRAVENOUS
  Filled 2015-02-14: qty 5

## 2015-02-14 MED ORDER — TECHNETIUM TC 99M SESTAMIBI GENERIC - CARDIOLITE
10.0000 | Freq: Once | INTRAVENOUS | Status: AC | PRN
  Administered 2015-02-14: 10.4 via INTRAVENOUS

## 2015-02-14 MED ORDER — REGADENOSON 0.4 MG/5ML IV SOLN
INTRAVENOUS | Status: AC
  Filled 2015-02-14: qty 5

## 2015-02-14 MED ORDER — SODIUM CHLORIDE 0.9 % IJ SOLN
10.0000 mL | INTRAMUSCULAR | Status: DC | PRN
  Administered 2015-02-14: 10 mL via INTRAVENOUS
  Filled 2015-02-14: qty 10

## 2015-02-14 MED ORDER — SODIUM CHLORIDE 0.9 % IJ SOLN
INTRAMUSCULAR | Status: AC
  Filled 2015-02-14: qty 3

## 2015-02-14 MED ORDER — TECHNETIUM TC 99M SESTAMIBI - CARDIOLITE
30.0000 | Freq: Once | INTRAVENOUS | Status: AC | PRN
  Administered 2015-02-14: 30.2 via INTRAVENOUS

## 2015-02-18 ENCOUNTER — Ambulatory Visit (INDEPENDENT_AMBULATORY_CARE_PROVIDER_SITE_OTHER): Payer: Medicare Other | Admitting: Adult Health

## 2015-02-18 ENCOUNTER — Encounter: Payer: Self-pay | Admitting: Adult Health

## 2015-02-18 VITALS — BP 122/84 | HR 76 | Ht 65.0 in | Wt 188.0 lb

## 2015-02-18 DIAGNOSIS — E785 Hyperlipidemia, unspecified: Secondary | ICD-10-CM

## 2015-02-18 NOTE — Progress Notes (Signed)
Cardiology Office Note   Date:  02/18/2015   ID:  Hurley, Bradley 07/25/1946, MRN [id number]  PCP:  [name], MD  Cardiologist: Tenny Craw MD/ Joni Reining, NP   Chief Complaint  Patient presents with  . Coronary Artery Disease  . Hypertension      History of Present Illness: Bradley Hurley is a 69 y.o. male who presents for ongoing assessment and management of coronary artery disease (ST elevation MI in August of 2013, status post mid LAD thrombectomy and drug-eluting stent x2 placed in the proximal and mid segments. Other history includes hypertension, obstructive sleep apnea, hyperlipidemia, depression, obesity, and ischemic cardiomyopathy, with an EF of 40% by catheterization increased to 55% and December 2013. The patient was seen by Dr. Diona Browner on consultation on 02/09/2015, in the setting of chest pain with exertion (mowing his lawn) with associated dyspnea. Cardiac enzymes are pending negative. His symptoms were felt to be atypical. There is no objective evidence of ischemia. Echocardiogram was ordered to evaluate her LV function.   Echocardiogram completed on 02/08/2015 revealed mild LVH with LVEF of 60-65%, grade 1 diastolic dysfunction. Mild left atrial enlargement. Mildly thickened mitral leaflets with mitral regurg. Mildly sclerotic aortic valve. The patient was resumed on HCTZ, and Effient .  Stress test was completed on 02/14/2015:There was no ST segment deviation noted during stress. The left ventricular ejection fraction is normal (55-65%). This is a low risk study.    The study is normal.   I have discussed these test results with the patient and answered numerous questions.    Past Medical History  Diagnosis Date  . Coronary atherosclerosis of native coronary artery 07/2002    Previous patient of [facility]; PCI  2003-90% septal perforator, 90% proximal and mid LAD->DES x2, normal CX, luminal irregularities in RCA, normal EF  . Essential hypertension, benign    . Hyperlipidemia     Lipid profile in 03/2012:182, 409,81,191  . Obstructive sleep apnea     [facility] - 05/2011  . Tinnitus   . Erectile dysfunction     Low serum testosterone level  . Depression   . Obesity   . Deviated septum   . 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 MI (myocardial infarction)     Anterior STEMI 8/13  . CAD (coronary artery disease)   . Anxiety   . Hemorrhoid   . Diverticulosis   . Cholelithiasis     Past Surgical History  Procedure Laterality Date  . Splenectomy    . Appendectomy    . Cardiac catheterization  07/28/2012    20% distal left main, patent prox & mid LAD stents w/ 25% ISR of the latter (unchanged from 03/2012 cath, mid-LAD stent placed in 2003), 30-40% ostial LCx, 40-50% distal marginal, 30% distal serial RCA stenoses; LVEF 55% with apical HK  . Coronary angioplasty with stent placement  2003    mid LAD  . Coronary angioplasty with stent placement  03/2012    STEMI s/p DES-prox LAD  . Tonsillectomy    . Colonoscopy N/A 08/12/2013    Procedure: COLONOSCOPY;  Surgeon: Corbin Ade, MD;  [facility];  Service: Endoscopy;  Laterality: N/A;  10:15  . Left heart catheterization with coronary angiogram N/A 04/10/2012    Procedure: LEFT HEART CATHETERIZATION WITH CORONARY ANGIOGRAM;  Surgeon: Kathleene Hazel, MD;  Location: [facility] CATH LAB;  Service: Cardiovascular;  Laterality: N/A;  . Left heart  catheterization with coronary angiogram N/A 07/28/2012    Procedure: LEFT HEART CATHETERIZATION WITH CORONARY ANGIOGRAM;  Surgeon: Kathleene Hazel, MD;  Location: [facility] CATH LAB;  Service: Cardiovascular;  Laterality: N/A;     Current Outpatient Prescriptions  Medication Sig Dispense Refill  . acetaminophen (TYLENOL) 500 MG tablet Take 500 mg by mouth every 6 (six) hours as needed for mild pain.     Marland Kitchen atorvastatin (LIPITOR) 20 MG tablet Take 1 tablet (20 mg total) by mouth daily. 90 tablet 3  .  Chlorpheniramine Maleate (ALLERGY PO) Take 1 tablet by mouth daily as needed (allergies).    . isosorbide mononitrate (IMDUR) 30 MG 24 hr tablet take 1 tablet by mouth once daily 30 tablet 6  . lisinopril (PRINIVIL,ZESTRIL) 20 MG tablet take 1 tablet by mouth once daily 30 tablet 3  . Multiple Vitamins-Minerals (MEGA MULTI MEN PO) Take 1 tablet by mouth daily as needed (energy).    . nitroGLYCERIN (NITROSTAT) 0.4 MG SL tablet Place 1 tablet (0.4 mg total) under the tongue every 5 (five) minutes as needed for chest pain. 50 tablet 6  . prasugrel (EFFIENT) 10 MG TABS tablet Take 1 tablet (10 mg total) by mouth daily. 30 tablet 6   No current facility-administered medications for this visit.   Facility-Administered Medications Ordered in Other Visits  Medication Dose Route Frequency Provider Last Rate Last Dose  . sodium chloride 0.9 % injection 10 mL  10 mL Intravenous PRN Jodelle Gross, NP   10 mL at 02/14/15 0957    Allergies:   Aspirin    Social History:  The patient  reports that he has quit smoking. His smoking use included Cigarettes. He has a 15 pack-year smoking history. He quit smokeless tobacco use about 34 years ago. He reports that he drinks about 0.5 oz of alcohol per week. He reports that he does not use illicit drugs.   Family History:  The patient's family history includes Arrhythmia in his father; Cancer in his mother.    ROS: .   All other systems are reviewed and negative.Unless otherwise mentioned in  H&P above.   PHYSICAL EXAM: VS:  BP 122/84 mmHg  Pulse 76  Ht  (1.651 m)  Wt 188 lb (85.276 kg)  BMI 31.28 kg/m2  SpO2 96% , BMI Body mass index is 31.28 kg/(m^2). GEN: Well nourished, well developed, in no acute distress HEENT: normal Neck: no JVD, carotid bruits, or masses Cardiac: RRR; no murmurs, rubs, or gallops,no edema  Respiratory:  clear to auscultation bilaterally, normal work of breathing GI: soft, nontender, nondistended, + BS MS: no  deformity or atrophy Skin: warm and dry, no rash Neuro:  Strength and sensation are intact Psych: euthymic mood, full affect   Recent Labs: 03/03/2014: ALT 23 02/08/2015: BUN 15; Creatinine, Ser 0.88; Hemoglobin 14.7; Platelets 356; Potassium 3.9; Sodium 138; TSH 2.006    Lipid Panel    Component Value Date/Time   CHOL 148 03/03/2014 1606   TRIG 92 03/03/2014 1606   HDL 44 03/03/2014 1606   CHOLHDL 3.4 03/03/2014 1606   VLDL 18 03/03/2014 1606   LDLCALC 86 03/03/2014 1606      Wt Readings from Last 3 Encounters:  02/18/15 188 lb (85.276 kg)  02/09/15 190 lb (86.183 kg)  03/03/14 190 lb (86.183 kg)      Other studies Reviewed: Additional studies/ records that were reviewed today include: Lipids from 2015 Review of the above records demonstrates: Good control of lipids  ASSESSMENT AND PLAN:  1.CAD: No symptoms. Normal stress test and echo. These have been discussed in detail with the patient answering multiple questions. He verbalizes understanding. We will see him in one year. He is advised on MYCHART to review other tests that do not have time go over with him. He is continued on his meds with refills.   2.Hypercholesterolemia: Good control from 2015 labs. I will check these for current status. He will follow up with PCP as he has not seen him in over a year. Copies to PCP  Current medicines are reviewed at length with the patient today.    Labs/ tests ordered today include: Fasting lipids and LFT's.   Orders Placed This Encounter  Procedures  . Hepatic function panel     Disposition:   FU with cardiology in 1 year  Signed, Joni Reining, NP  02/18/2015 10:41 PM    [facility] [address] [place], [place] [address] Phone: [phone]; Fax: [phone]

## 2015-02-18 NOTE — Patient Instructions (Signed)
Your physician wants you to follow-up in: 1 year with Joni Reining NP You will receive a reminder letter in the mail two months in advance. If you don't receive a letter, please call our office to schedule the follow-up appointment.    Your physician recommends that you continue on your current medications as directed. Please refer to the Current Medication list given to you today.    Please get FASTING lab work LFT'S,LIPID     Thank you for choosing [facility] !

## 2015-02-18 NOTE — Progress Notes (Deleted)
Name: Bradley Hurley    DOB: 1946-05-27  Age: 69 y.o.  MR#: [id number]       PCP:  [name], MD      Insurance: Payor: MEDICARE / Plan: MEDICARE PART A AND B / Product Type: *No Product type* /   CC:    Chief Complaint  Patient presents with  . Coronary Artery Disease  . Hypertension    VS Filed Vitals:   02/18/15 1351  BP: 122/84  Pulse: 76  Height:  (1.651 m)  Weight: 188 lb (85.276 kg)  SpO2: 96%    Weights Current Weight  02/18/15 188 lb (85.276 kg)  02/09/15 190 lb (86.183 kg)  03/03/14 190 lb (86.183 kg)    Blood Pressure  BP Readings from Last 3 Encounters:  02/18/15 122/84  02/09/15 130/64  03/03/14 164/84     Admit date:  (Not on file) Last encounter with [facility]:  12/05/2014   Allergy Aspirin  Current Outpatient Prescriptions  Medication Sig Dispense Refill  . acetaminophen (TYLENOL) 500 MG tablet Take 500 mg by mouth every 6 (six) hours as needed for mild pain.     Marland Kitchen atorvastatin (LIPITOR) 20 MG tablet Take 1 tablet (20 mg total) by mouth daily. 90 tablet 3  . Chlorpheniramine Maleate (ALLERGY PO) Take 1 tablet by mouth daily as needed (allergies).    . isosorbide mononitrate (IMDUR) 30 MG 24 hr tablet take 1 tablet by mouth once daily 30 tablet 6  . lisinopril (PRINIVIL,ZESTRIL) 20 MG tablet take 1 tablet by mouth once daily 30 tablet 3  . Multiple Vitamins-Minerals (MEGA MULTI MEN PO) Take 1 tablet by mouth daily as needed (energy).    . nitroGLYCERIN (NITROSTAT) 0.4 MG SL tablet Place 1 tablet (0.4 mg total) under the tongue every 5 (five) minutes as needed for chest pain. 50 tablet 6  . prasugrel (EFFIENT) 10 MG TABS tablet Take 1 tablet (10 mg total) by mouth daily. 30 tablet 6   No current facility-administered medications for this visit.   Facility-Administered Medications Ordered in Other Visits  Medication Dose Route Frequency Provider Last Rate Last Dose  . sodium chloride 0.9 % injection 10 mL  10 mL Intravenous PRN Jodelle Gross, NP   10 mL at 02/14/15 0957    Discontinued Meds:    Medications Discontinued During This Encounter  Medication Reason  . hydrochlorothiazide (HYDRODIURIL) 25 MG tablet Error    Patient Active Problem List   Diagnosis Date Noted  . Cholelithiasis 02/09/2015  . Chest pain at rest 02/08/2015  . Chest pain 02/08/2015  . RUQ abdominal pain 02/08/2015  . Obstructive sleep apnea   . Anxiety   . Hemorrhoid   . Diverticulosis   . Obesity   . Nausea with vomiting   . NSTEMI (non-ST elevated myocardial infarction) 07/28/2012  . Aspirin allergy 07/28/2012  . STEMI (ST elevation myocardial infarction) 04/11/2012  . HTN (hypertension) 04/11/2012  . Hypokalemia 04/11/2012  . CAD (coronary artery disease) 04/11/2012  . Hyperlipidemia 04/11/2012    LABS    Component Value Date/Time   NA 138 02/08/2015 0936   NA 137 03/03/2014 1606   NA 140 07/27/2012 0501   K 3.9 02/08/2015 0936   K 5.1 03/03/2014 1606   K 4.1 07/27/2012 0501   CL 103 02/08/2015 0936   CL 102 03/03/2014 1606   CL 104 07/27/2012 0501   CO2 27 02/08/2015 0936   CO2 28 03/03/2014 1606   CO2 29 07/27/2012  0501   GLUCOSE 102* 02/08/2015 0936   GLUCOSE 87 03/03/2014 1606   GLUCOSE 109* 07/27/2012 0501   BUN 15 02/08/2015 0936   BUN 19 03/03/2014 1606   BUN 18 07/27/2012 0501   CREATININE 0.88 02/08/2015 0936   CREATININE 0.96 03/03/2014 1606   CREATININE 1.00 07/27/2012 0501   CREATININE 1.15 07/26/2012 1125   CALCIUM 9.0 02/08/2015 0936   CALCIUM 10.0 03/03/2014 1606   CALCIUM 9.2 07/27/2012 0501   GFRNONAA >60 02/08/2015 0936   GFRNONAA 76* 07/27/2012 0501   GFRNONAA 65* 07/26/2012 1125   GFRAA >60 02/08/2015 0936   GFRAA 89* 07/27/2012 0501   GFRAA 75* 07/26/2012 1125   CMP     Component Value Date/Time   NA 138 02/08/2015 0936   K 3.9 02/08/2015 0936   CL 103 02/08/2015 0936   CO2 27 02/08/2015 0936   GLUCOSE 102* 02/08/2015 0936   BUN 15 02/08/2015 0936   CREATININE 0.88 02/08/2015  0936   CREATININE 0.96 03/03/2014 1606   CALCIUM 9.0 02/08/2015 0936   PROT 7.5 03/03/2014 1606   ALBUMIN 4.1 03/03/2014 1606   AST 20 03/03/2014 1606   ALT 23 03/03/2014 1606   ALKPHOS 46 03/03/2014 1606   BILITOT 0.6 03/03/2014 1606   GFRNONAA >60 02/08/2015 0936   GFRAA >60 02/08/2015 0936       Component Value Date/Time   WBC 8.2 02/08/2015 0936   WBC 8.6 03/03/2014 1606   WBC 9.6 07/28/2012 0500   HGB 14.7 02/08/2015 0936   HGB 14.3 03/03/2014 1606   HGB 12.3* 07/28/2012 0500   HCT 43.4 02/08/2015 0936   HCT 41.7 03/03/2014 1606   HCT 35.5* 07/28/2012 0500   MCV 91.0 02/08/2015 0936   MCV 88.2 03/03/2014 1606   MCV 91.0 07/28/2012 0500    Lipid Panel     Component Value Date/Time   CHOL 148 03/03/2014 1606   TRIG 92 03/03/2014 1606   HDL 44 03/03/2014 1606   CHOLHDL 3.4 03/03/2014 1606   VLDL 18 03/03/2014 1606   LDLCALC 86 03/03/2014 1606    ABG No results found for: PHART, PCO2ART, PO2ART, HCO3, TCO2, ACIDBASEDEF, O2SAT   Lab Results  Component Value Date   TSH 2.006 02/08/2015   BNP (last 3 results) No results for input(s): BNP in the last 8760 hours.  ProBNP (last 3 results) No results for input(s): PROBNP in the last 8760 hours.  Cardiac Panel (last 3 results) No results for input(s): CKTOTAL, CKMB, TROPONINI, RELINDX in the last 72 hours.  Iron/TIBC/Ferritin/ %Sat No results found for: IRON, TIBC, FERRITIN, IRONPCTSAT   EKG Orders placed or performed during the hospital encounter of 02/08/15  . EKG 12-Lead  . EKG 12-Lead  . EKG 12-Lead  . EKG 12-Lead  . EKG 12-Lead  . EKG 12-Lead  . EKG     Prior Assessment and Plan Problem List as of 02/18/2015    STEMI (ST elevation myocardial infarction)   Last Assessment & Plan 07/30/2013 Office Visit Written 07/30/2013  3:52 PM by Jodelle Gross, NP    No recurrent complaints of chest pain. He wants to stop the Effient, but I have advised him to stay on this medication for another year, maybe  longer with thrombotic STEMI. He is okay to stop Effient temporarily for 5 days, prior to colonoscopy, but he is advised to take it the day after the procedure. Will continue risk management.       HTN (hypertension)   Last  Assessment & Plan 03/03/2014 Office Visit Edited 03/03/2014  3:47 PM by Jodelle Gross, NP    The pressure is not adequately controlled. It is elevated at 164/84. I rechecked it manually in the office and found to be 180/100. He admits to eating salty foods and fast foods. Not watching what he is eating to adhere to his heart healthy diet. He states that his blood pressure is been elevated at home in the 160s 170s systolic. We will increase his lisinopril to 20 mg daily. Add HCTZ 25 mg daily. I have given him a blood pressure recording sheet in order to document blood pressures which she takes at home. He will return in a week I will see him in 2 weeks. I will check BMET for kidney function.  He states he is under a lot of stress and he feels this is contributing to his blood pressure and is requesting refills on Ativan. I deferred him to Dr. Sudie Bailey for this medications cardiology will not be treating anxiety.      Hypokalemia   CAD (coronary artery disease)   Last Assessment & Plan 03/03/2014 Office Visit Written 03/03/2014  3:45 PM by Jodelle Gross, NP    Most recent cardiac catheterization was completed in August of 2013, he had 25% in-stent restenosis of mid LAD stents. EF was 55% with apical hypokinesis. He was continued on medical therapy dual antiplatelet therapy. Once blood pressure is better controlled, I will consider repeating a stress test and echocardiogram if he continues to be symptomatic.      Hyperlipidemia   Last Assessment & Plan 03/03/2014 Office Visit Written 03/03/2014  3:46 PM by Jodelle Gross, NP    He is not adhering to a low cholesterol diet. He is uncertain what statin he is taking. Will check lipids and LFTs to evaluate his status. We will also  check a hemoglobin A1c and a TSH as he states that he has been feeling worse, with a family history of diabetes. I will send all these labs to Dr. Sudie Bailey.      NSTEMI (non-ST elevated myocardial infarction)   Aspirin allergy   Chest pain at rest   Obstructive sleep apnea   Anxiety   Hemorrhoid   Diverticulosis   Obesity   Chest pain   RUQ abdominal pain   Nausea with vomiting   Cholelithiasis       Imaging: Dg Chest 2 View  02/08/2015   CLINICAL DATA:  Left side chest pain radiating into the jaw beginning this morning.  EXAM: CHEST  2 VIEW  COMPARISON:  Single view of the chest 07/26/2012 and 04/10/2012.  FINDINGS: Heart size and mediastinal contours are within normal limits. Both lungs are clear. Visualized skeletal structures are unremarkable.  IMPRESSION: No acute cardiopulmonary disease.   Electronically Signed   By: Drusilla Kanner M.D.   On: 02/08/2015 10:23   Nm Myocar Multi W/spect W/wall Motion / Ef  02/15/2015    There was no ST segment deviation noted during stress.  The left ventricular ejection fraction is normal (55-65%).  This is a low risk study.  The study is normal.    US Abdomen Limited Ruq  02/09/2015   CLINICAL DATA:  Right upper quadrant pain 2 months.  EXAM: US ABDOMEN LIMITED - RIGHT UPPER QUADRANT  COMPARISON:  None.  FINDINGS: Gallbladder:  Mild to moderate cholelithiasis. Mild amount of associated gallbladder sludge. No significant gallbladder wall thickening as the wall measures 2.9 mm. Negative sonographic  Murphy's sign.  Common bile duct:  Diameter: 5.6 mm.  Liver:  No focal lesion identified. Within normal limits in parenchymal echogenicity.  IMPRESSION: Mild to moderate cholelithiasis without additional sonographic evidence to suggest cholecystitis.   Electronically Signed   By: Elberta Fortis M.D.   On: 02/09/2015 13:03

## 2015-02-22 LAB — NM MYOCAR MULTI W/SPECT W/WALL MOTION / EF
CHL CUP NUCLEAR SDS: 0
CSEPPHR: 86 {beats}/min
LV dias vol: 77 mL
LVSYSVOL: 29 mL
RATE: 0.34
Rest HR: 56 {beats}/min
SRS: 5
SSS: 5
TID: 1.48

## 2015-03-21 ENCOUNTER — Other Ambulatory Visit: Payer: Self-pay | Admitting: Adult Health

## 2015-03-29 DIAGNOSIS — J0100 Acute maxillary sinusitis, unspecified: Secondary | ICD-10-CM | POA: Diagnosis not present

## 2015-03-29 DIAGNOSIS — J3089 Other allergic rhinitis: Secondary | ICD-10-CM | POA: Diagnosis not present

## 2015-10-01 ENCOUNTER — Other Ambulatory Visit: Payer: Self-pay | Admitting: Adult Health

## 2016-01-08 ENCOUNTER — Other Ambulatory Visit: Payer: Self-pay | Admitting: Adult Health

## 2016-02-20 ENCOUNTER — Other Ambulatory Visit: Payer: Self-pay | Admitting: Adult Health

## 2016-03-02 ENCOUNTER — Encounter: Payer: Self-pay | Admitting: Cardiovascular Disease

## 2016-03-02 ENCOUNTER — Ambulatory Visit (INDEPENDENT_AMBULATORY_CARE_PROVIDER_SITE_OTHER): Payer: Medicare Other | Admitting: Cardiovascular Disease

## 2016-03-02 VITALS — BP 110/64 | HR 76 | Ht 65.0 in | Wt 190.0 lb

## 2016-03-02 DIAGNOSIS — I25118 Atherosclerotic heart disease of native coronary artery with other forms of angina pectoris: Secondary | ICD-10-CM | POA: Diagnosis not present

## 2016-03-02 DIAGNOSIS — E785 Hyperlipidemia, unspecified: Secondary | ICD-10-CM | POA: Diagnosis not present

## 2016-03-02 DIAGNOSIS — Z955 Presence of coronary angioplasty implant and graft: Secondary | ICD-10-CM | POA: Diagnosis not present

## 2016-03-02 DIAGNOSIS — I10 Essential (primary) hypertension: Secondary | ICD-10-CM

## 2016-03-02 NOTE — Patient Instructions (Signed)
Medication Instructions:  Your physician recommends that you continue on your current medications as directed. Please refer to the Current Medication list given to you today.  Labwork: NONE  Testing/Procedures: NONE  Follow-Up: Your physician recommends that you schedule a follow-up appointment in: 3 MONTHS WITH DR. MCDOWELL.  Any Other Special Instructions Will Be Listed Below (If Applicable).  If you need a refill on your cardiac medications before your next appointment, please call your pharmacy. 

## 2016-03-02 NOTE — Progress Notes (Addendum)
Patient ID: Bradley ArbourRobert R Pina, male   DOB: Jan 01, 1946, 70 y.o.   MRN: [id number]      SUBJECTIVE: The patient is a 70 year old male who presents for the evaluation of coronary artery disease. He has a history of a STEMI in August 2013 status post mid LAD thrombectomy with drug eluting stent placement to both the proximal and mid segments. He was seen by Dr. Diona BrownerMcDowell in consultation in June 2016. He underwent a normal nuclear stress test on 02/22/15.  He denies exertional chest pain. He says he has chronic exertional shortness of breath. He said he has a gallbladder stone which surgeons will not operate on. He denies right upper quadrant pain unless he eats a lot of gravy. He has sleep apnea but does not use CPAP and complains of daytime somnolence.  ECG performed in the office today which I personally reviewed demonstrates normal sinus rhythm with no ischemic ST segment or T-wave abnormalities, nor any arrhythmias.   Review of Systems: As per "subjective", otherwise negative.  Allergies  Allergen Reactions  . Aspirin Anaphylaxis    Throat closes    Current Outpatient Prescriptions  Medication Sig Dispense Refill  . acetaminophen (TYLENOL) 500 MG tablet Take 500 mg by mouth every 6 (six) hours as needed for mild pain.     Marland Kitchen. atorvastatin (LIPITOR) 20 MG tablet Take 1 tablet (20 mg total) by mouth daily. 90 tablet 3  . atorvastatin (LIPITOR) 20 MG tablet take 1 tablet by mouth once daily 30 tablet 11  . Chlorpheniramine Maleate (ALLERGY PO) Take 1 tablet by mouth daily as needed (allergies).    . EFFIENT 10 MG TABS tablet take 1 tablet by mouth once daily 30 tablet 11  . isosorbide mononitrate (IMDUR) 30 MG 24 hr tablet take 1 tablet by mouth once daily 15 tablet 0  . lisinopril (PRINIVIL,ZESTRIL) 20 MG tablet take 1 tablet by mouth once daily 30 tablet 3  . Multiple Vitamins-Minerals (MEGA MULTI MEN PO) Take 1 tablet by mouth daily as needed (energy).    . nitroGLYCERIN (NITROSTAT) 0.4 MG SL  tablet Place 1 tablet (0.4 mg total) under the tongue every 5 (five) minutes as needed for chest pain. 50 tablet 6   No current facility-administered medications for this visit.    Past Medical History  Diagnosis Date  . Coronary atherosclerosis of native coronary artery 07/2002    Previous patient of [facility]; PCI  2003-90% septal perforator, 90% proximal and mid LAD->DES x2, normal CX, luminal irregularities in RCA, normal EF  . Essential hypertension, benign   . Hyperlipidemia     Lipid profile in 03/2012:182, 295,28,413110,48,112  . Obstructive sleep apnea     [facility] - 05/2011  . Tinnitus   . Erectile dysfunction     Low serum testosterone level  . Depression   . Obesity   . Deviated septum   . 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 MI (myocardial infarction) (HCC)     Anterior STEMI 8/13  . CAD (coronary artery disease)   . Anxiety   . Hemorrhoid   . Diverticulosis   . Cholelithiasis     Past Surgical History  Procedure Laterality Date  . Splenectomy    . Appendectomy    . Cardiac catheterization  07/28/2012    20% distal left main, patent prox & mid LAD stents w/ 25% ISR of the latter (unchanged from 03/2012 cath, mid-LAD stent placed in 2003), 30-40%  ostial LCx, 40-50% distal marginal, 30% distal serial RCA stenoses; LVEF 55% with apical HK  . Coronary angioplasty with stent placement  2003    mid LAD  . Coronary angioplasty with stent placement  03/2012    STEMI s/p DES-prox LAD  . Tonsillectomy    . Colonoscopy N/A 08/12/2013    Procedure: COLONOSCOPY;  Surgeon: Corbin Adeobert M Rourk, MD;  [facility];  Service: Endoscopy;  Laterality: N/A;  10:15  . Left heart catheterization with coronary angiogram N/A 04/10/2012    Procedure: LEFT HEART CATHETERIZATION WITH CORONARY ANGIOGRAM;  Surgeon: Kathleene Hazelhristopher D McAlhany, MD;  [facility];  Service: Cardiovascular;  Laterality: N/A;  . Left heart catheterization with coronary angiogram  N/A 07/28/2012    Procedure: LEFT HEART CATHETERIZATION WITH CORONARY ANGIOGRAM;  Surgeon: Kathleene Hazelhristopher D McAlhany, MD;  [facility];  Service: Cardiovascular;  Laterality: N/A;    Social History   Social History  . Marital Status: Married    Spouse Name: N/A  . Number of Children: N/A  . Years of Education: N/A   Occupational History  . Rental Investment banker, corporateproperty manager     self-employed   Social History Main Topics  . Smoking status: Former Smoker -- 1.00 packs/day for 15 years    Types: Cigarettes    Quit date: 09/03/1995  . Smokeless tobacco: Former NeurosurgeonUser    Quit date: 01/30/1981     Comment: Quit in 1982  . Alcohol Use: 0.6 oz/week    1 Standard drinks or equivalent per week     Comment: occ  . Drug Use: No  . Sexual Activity: Yes   Other Topics Concern  . 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     Filed Vitals:   03/02/16 1029  BP: 110/64  Pulse: 76  Height: 5\' 5"  (1.651 m)  Weight: 190 lb (86.183 kg)  SpO2: 95%    PHYSICAL EXAM General: NAD HEENT: Normal. Neck: No JVD, no thyromegaly. Lungs: Clear to auscultation bilaterally with normal respiratory effort. CV: Nondisplaced PMI.  Regular rate and rhythm, normal S1/S2, no S3/S4, no murmur. No pretibial or periankle edema.  No carotid bruit.   Abdomen: Soft, nontender, obese. Neurologic: Alert and oriented.  Psych: Normal affect. Skin: Normal. Musculoskeletal: No gross deformities.    ECG: Most recent ECG reviewed.      ASSESSMENT AND PLAN: 1. CAD with h/o NSTEMI and DES to prox and mid LAD: Symptomatically stable. No changes to medical therapy to medical therapy which includes Lipitor, Effient, lisinopril, and Imdur.  2. Essential HTN: Controlled. No changes.  3. Hyperlipidemia: On statin therapy. Will obtain a lipid panel.  Dispo: fu 3 months with Dr. Diona BrownerMcDowell.   Prentice DockerSuresh Koneswaran, M.D., F.A.C.C.

## 2016-03-02 NOTE — Addendum Note (Signed)
Addended by: Abelino DerrickMCGHEE, Sabine Tenenbaum R on: 03/02/2016 10:58 AM   Modules accepted: Orders

## 2016-05-02 ENCOUNTER — Other Ambulatory Visit: Payer: Self-pay | Admitting: Adult Health

## 2016-06-27 DIAGNOSIS — J189 Pneumonia, unspecified organism: Secondary | ICD-10-CM | POA: Diagnosis not present

## 2016-06-27 DIAGNOSIS — I10 Essential (primary) hypertension: Secondary | ICD-10-CM | POA: Diagnosis not present

## 2016-06-27 DIAGNOSIS — K047 Periapical abscess without sinus: Secondary | ICD-10-CM | POA: Diagnosis not present

## 2016-06-27 DIAGNOSIS — E6609 Other obesity due to excess calories: Secondary | ICD-10-CM | POA: Diagnosis not present

## 2016-06-27 DIAGNOSIS — E7800 Pure hypercholesterolemia, unspecified: Secondary | ICD-10-CM | POA: Diagnosis not present

## 2016-06-27 DIAGNOSIS — J320 Chronic maxillary sinusitis: Secondary | ICD-10-CM | POA: Diagnosis not present

## 2016-07-03 ENCOUNTER — Other Ambulatory Visit (HOSPITAL_COMMUNITY): Payer: Self-pay | Admitting: Family Medicine

## 2016-07-03 ENCOUNTER — Ambulatory Visit (HOSPITAL_COMMUNITY)
Admission: RE | Admit: 2016-07-03 | Discharge: 2016-07-03 | Disposition: A | Discharge status: Home / Self Care | Payer: Medicare Other | Source: Ambulatory Visit | Attending: Family Medicine | Admitting: Family Medicine

## 2016-07-03 DIAGNOSIS — R05 Cough: Secondary | ICD-10-CM | POA: Insufficient documentation

## 2016-07-03 DIAGNOSIS — R058 Other specified cough: Secondary | ICD-10-CM

## 2016-07-03 DIAGNOSIS — R093 Abnormal sputum: Secondary | ICD-10-CM | POA: Diagnosis not present

## 2016-07-04 DIAGNOSIS — S20212A Contusion of left front wall of thorax, initial encounter: Secondary | ICD-10-CM | POA: Diagnosis not present

## 2016-07-04 DIAGNOSIS — K047 Periapical abscess without sinus: Secondary | ICD-10-CM | POA: Diagnosis not present

## 2016-10-26 ENCOUNTER — Other Ambulatory Visit: Payer: Self-pay | Admitting: Adult Health

## 2016-11-27 DIAGNOSIS — J069 Acute upper respiratory infection, unspecified: Secondary | ICD-10-CM | POA: Diagnosis not present

## 2016-12-25 DIAGNOSIS — I10 Essential (primary) hypertension: Secondary | ICD-10-CM | POA: Diagnosis not present

## 2016-12-25 DIAGNOSIS — Z87891 Personal history of nicotine dependence: Secondary | ICD-10-CM | POA: Diagnosis not present

## 2016-12-25 DIAGNOSIS — I160 Hypertensive urgency: Secondary | ICD-10-CM | POA: Diagnosis not present

## 2016-12-25 DIAGNOSIS — R079 Chest pain, unspecified: Secondary | ICD-10-CM | POA: Diagnosis not present

## 2016-12-25 DIAGNOSIS — Z955 Presence of coronary angioplasty implant and graft: Secondary | ICD-10-CM | POA: Diagnosis not present

## 2016-12-25 DIAGNOSIS — I252 Old myocardial infarction: Secondary | ICD-10-CM | POA: Diagnosis not present

## 2016-12-25 DIAGNOSIS — I2510 Atherosclerotic heart disease of native coronary artery without angina pectoris: Secondary | ICD-10-CM | POA: Diagnosis not present

## 2016-12-25 DIAGNOSIS — I200 Unstable angina: Secondary | ICD-10-CM | POA: Diagnosis not present

## 2016-12-25 DIAGNOSIS — R0789 Other chest pain: Secondary | ICD-10-CM | POA: Diagnosis not present

## 2016-12-26 ENCOUNTER — Observation Stay (HOSPITAL_COMMUNITY)
Admission: EM | Admit: 2016-12-26 | Discharge: 2016-12-27 | Disposition: A | Discharge status: Home / Self Care | Payer: Medicare Other | Source: Other Acute Inpatient Hospital | Attending: Internal Medicine | Admitting: Internal Medicine

## 2016-12-26 DIAGNOSIS — R001 Bradycardia, unspecified: Secondary | ICD-10-CM | POA: Diagnosis not present

## 2016-12-26 DIAGNOSIS — I160 Hypertensive urgency: Secondary | ICD-10-CM

## 2016-12-26 DIAGNOSIS — I2510 Atherosclerotic heart disease of native coronary artery without angina pectoris: Secondary | ICD-10-CM | POA: Diagnosis present

## 2016-12-26 DIAGNOSIS — I10 Essential (primary) hypertension: Secondary | ICD-10-CM | POA: Diagnosis present

## 2016-12-26 DIAGNOSIS — Z87891 Personal history of nicotine dependence: Secondary | ICD-10-CM | POA: Insufficient documentation

## 2016-12-26 DIAGNOSIS — I25119 Atherosclerotic heart disease of native coronary artery with unspecified angina pectoris: Secondary | ICD-10-CM | POA: Diagnosis not present

## 2016-12-26 DIAGNOSIS — F419 Anxiety disorder, unspecified: Secondary | ICD-10-CM

## 2016-12-26 DIAGNOSIS — G4733 Obstructive sleep apnea (adult) (pediatric): Secondary | ICD-10-CM | POA: Diagnosis present

## 2016-12-26 DIAGNOSIS — R072 Precordial pain: Secondary | ICD-10-CM

## 2016-12-26 DIAGNOSIS — E785 Hyperlipidemia, unspecified: Secondary | ICD-10-CM | POA: Diagnosis present

## 2016-12-26 DIAGNOSIS — Z888 Allergy status to other drugs, medicaments and biological substances status: Secondary | ICD-10-CM

## 2016-12-26 DIAGNOSIS — Z955 Presence of coronary angioplasty implant and graft: Secondary | ICD-10-CM | POA: Insufficient documentation

## 2016-12-26 DIAGNOSIS — R0789 Other chest pain: Principal | ICD-10-CM | POA: Insufficient documentation

## 2016-12-26 DIAGNOSIS — R079 Chest pain, unspecified: Secondary | ICD-10-CM | POA: Diagnosis present

## 2016-12-26 DIAGNOSIS — Z79899 Other long term (current) drug therapy: Secondary | ICD-10-CM | POA: Insufficient documentation

## 2016-12-26 LAB — LIPID PANEL
CHOLESTEROL: 151 mg/dL (ref 0–200)
HDL: 38 mg/dL — ABNORMAL LOW (ref >40)
LDL CALC: 86 mg/dL (ref 0–99)
Total CHOL/HDL Ratio: 4 RATIO
Triglycerides: 134 mg/dL (ref <150)
VLDL: 27 mg/dL (ref 0–40)

## 2016-12-26 LAB — HEPARIN LEVEL (UNFRACTIONATED)
Heparin Unfractionated: 0.47 IU/mL (ref 0.30–0.70)
Heparin Unfractionated: 0.41 IU/mL (ref 0.30–0.70)

## 2016-12-26 LAB — CBC
HEMATOCRIT: 38.5 % — AB (ref 39.0–52.0)
HEMOGLOBIN: 12.9 g/dL — AB (ref 13.0–17.0)
MCH: 29.9 pg (ref 26.0–34.0)
MCHC: 33.5 g/dL (ref 30.0–36.0)
MCV: 89.3 fL (ref 78.0–100.0)
Platelets: 284 10*3/uL (ref 150–400)
RBC: 4.31 MIL/uL (ref 4.22–5.81)
RDW: 15.6 % — ABNORMAL HIGH (ref 11.5–15.5)
WBC: 8.7 10*3/uL (ref 4.0–10.5)

## 2016-12-26 LAB — TROPONIN I: TROPONIN I: 0.04 ng/mL — AB (ref <0.03)

## 2016-12-26 LAB — CREATININE, SERUM
Creatinine, Ser: 0.98 mg/dL (ref 0.61–1.24)
GFR calc Af Amer: >60 mL/min (ref >60)
GFR calc non Af Amer: >60 mL/min (ref >60)

## 2016-12-26 LAB — MRSA PCR SCREENING: MRSA by PCR: NEGATIVE

## 2016-12-26 MED ORDER — PRASUGREL HCL 10 MG PO TABS
10.0000 mg | ORAL_TABLET | Freq: Every day | ORAL | Status: DC
  Administered 2016-12-26 – 2016-12-27 (×2): 10 mg via ORAL
  Filled 2016-12-26 (×2): qty 1

## 2016-12-26 MED ORDER — ENOXAPARIN SODIUM 40 MG/0.4ML ~~LOC~~ SOLN: 40.0000 mg | SUBCUTANEOUS | Status: DC

## 2016-12-26 MED ORDER — NITROGLYCERIN 0.4 MG SL SUBL: 0.4000 mg | SUBLINGUAL_TABLET | SUBLINGUAL | Status: DC | PRN

## 2016-12-26 MED ORDER — HEPARIN (PORCINE) IN NACL 100-0.45 UNIT/ML-% IJ SOLN
1050.0000 [IU]/h | INTRAMUSCULAR | Status: DC
  Administered 2016-12-26: 1000 [IU]/h via INTRAVENOUS
  Filled 2016-12-26 (×2): qty 250

## 2016-12-26 MED ORDER — NITROGLYCERIN IN D5W 200-5 MCG/ML-% IV SOLN: 2.0000 ug/min | INTRAVENOUS | Status: DC

## 2016-12-26 MED ORDER — CARVEDILOL 12.5 MG PO TABS
12.5000 mg | ORAL_TABLET | Freq: Two times a day (BID) | ORAL | Status: DC
  Administered 2016-12-26 – 2016-12-27 (×3): 12.5 mg via ORAL
  Filled 2016-12-26 (×3): qty 1

## 2016-12-26 MED ORDER — ISOSORBIDE MONONITRATE ER 30 MG PO TB24
30.0000 mg | ORAL_TABLET | Freq: Every day | ORAL | Status: DC
  Administered 2016-12-26 – 2016-12-27 (×2): 30 mg via ORAL
  Filled 2016-12-26 (×2): qty 1

## 2016-12-26 MED ORDER — ATORVASTATIN CALCIUM 20 MG PO TABS
20.0000 mg | ORAL_TABLET | Freq: Every day | ORAL | Status: DC
  Administered 2016-12-26: 20 mg via ORAL
  Filled 2016-12-26: qty 1

## 2016-12-26 MED ORDER — SODIUM CHLORIDE 0.9% FLUSH
3.0000 mL | Freq: Two times a day (BID) | INTRAVENOUS | Status: DC
  Administered 2016-12-26 (×2): 3 mL via INTRAVENOUS

## 2016-12-26 MED ORDER — LISINOPRIL 20 MG PO TABS
20.0000 mg | ORAL_TABLET | Freq: Every day | ORAL | Status: DC
  Administered 2016-12-26 – 2016-12-27 (×2): 20 mg via ORAL
  Filled 2016-12-26 (×2): qty 1

## 2016-12-26 MED ORDER — HEPARIN BOLUS VIA INFUSION
4000.0000 [IU] | Freq: Once | INTRAVENOUS | Status: AC
  Administered 2016-12-26: 4000 [IU] via INTRAVENOUS
  Filled 2016-12-26: qty 4000

## 2016-12-26 MED FILL — Nitroglycerin IV Soln 200 MCG/ML in D5W: INTRAVENOUS | Qty: 250 | Status: AC

## 2016-12-26 NOTE — Progress Notes (Signed)
Progress Note  Patient Name: Bradley Hurley Date of Encounter: 12/26/2016  Primary Cardiologist: Patient prefers to followed by Joni Reining (previously seen by Dr. Tenny Craw and Dr. Purvis Sheffield)  Subjective   Non compliant with meds for the past 3 months. He does feels chest tightness a day after not taking medications.   Inpatient Medications    Scheduled Meds: . atorvastatin  20 mg Oral q1800  . carvedilol  12.5 mg Oral BID WC  . isosorbide mononitrate  30 mg Oral Daily  . lisinopril  20 mg Oral Daily  . prasugrel  10 mg Oral Daily  . sodium chloride flush  3 mL Intravenous Q12H   Continuous Infusions: . heparin 1,000 Units/hr (12/26/16 0527)  . nitroGLYCERIN 16.667 mcg/min (12/26/16 0526)   PRN Meds: nitroGLYCERIN   Vital Signs    Vitals:   12/26/16 0121 12/26/16 0221 12/26/16 0749 12/26/16 0857  BP: (!) 160/83 (!) 152/81 (!) 169/90 (!) 190/89  Pulse: 72 65 63 68  Resp: Temp:   97.8 F (36.6 C)   TempSrc:   Oral   SpO2: 94% 91% 95%   Weight:        Intake/Output Summary (Last 24 hours) at 12/26/16 0901 Last data filed at 12/26/16 3664  Gross per 24 hour  Intake           153.66 ml  Output                0 ml  Net           153.66 ml   Filed Weights   12/25/16 2300  Weight: 187 lb 4.8 oz (85 kg)    Telemetry    NSR with PACS- Personally Reviewed  ECG    NSR with non specific T wave abnormality in inferior leads - Personally Reviewed  Physical Exam   GEN: No acute distress.   Neck: No JVD Cardiac: RRR, no murmurs, rubs, or gallops.  Respiratory: Clear to auscultation bilaterally. GI: Soft, nontender, non-distended  MS: No edema; No deformity. Neuro:  Nonfocal  Psych: Normal affect   Labs    Chemistry Recent Labs Lab 12/26/16 0212  CREATININE 0.98  GFRNONAA >60  GFRAA >60     Hematology Recent Labs Lab 12/26/16 0212  WBC 8.7  RBC 4.31  HGB 12.9*  HCT 38.5*  MCV 89.3  MCH 29.9  MCHC 33.5  RDW 15.6*  PLT 284     Cardiac Enzymes Recent Labs Lab 12/26/16 0212  TROPONINI 0.04*   No results for input(s): TROPIPOC in the last 168 hours.   BNPNo results for input(s): BNP, PROBNP in the last 168 hours.   DDimer No results for input(s): DDIMER in the last 168 hours.   Radiology    No results found.  Cardiac Studies   None this admission   Patient Profile     Bradley Hurley is a 71 y.o. male with past medical history of coronary artery disease with a remote history of stents to his LAD in 2003 with subsequent drug eluting stents to his proximal and mid-LAD, essential hypertension, prior tobacco abuse, and obstructive sleep apnea for which he is non-compliant with his CPAP who has been transferred from outside hospital for further evaluation of chest pain.  Assessment & Plan    1. Chest pain with hx of CAD - In setting on non compliance with medications and hypertensive urgency. Could be unstable angina. Troponin 0.04. Will trend. EKG with  non specific changes. Will observe the trend today and follow blood pressure response. NPO after MN in case required testing. Likely outpatient testing.  - Continue IV heparin, Effient, BB, imdur and lisinopril.   2. Hypertensive Urgency - Resume home medication this morning.  Will up titrate medication later today pending follow up blood pressure response.   3. HLD - continue lipitor . Check lipid panel tomorrow.   4. OSA - non compliant with CPAP   Signed, Braian Tijerina, PA  12/26/2016, 9:01 AM

## 2016-12-26 NOTE — Progress Notes (Signed)
ANTICOAGULATION CONSULT NOTE -Follow up  Pharmacy Consult for Heparin Indication: chest pain/ACS  Allergies  Allergen Reactions  . Aspirin Anaphylaxis    Throat closes    Patient Measurements: Weight: 187 lb 4.8 oz (85 kg) Heparin Dosing Weight: 79 kg  Vital Signs: Temp: 98.5 F (36.9 C) (05/02 1608) Temp Source: Oral (05/02 1608) BP: 115/64 (05/02 1731) Pulse Rate: 68 (05/02 1731)  Labs:  Recent Labs  12/26/16 0212 12/26/16 0733 12/26/16 1114 12/26/16 1448 12/26/16 1943  HGB 12.9*  --   --   --   --   HCT 38.5*  --   --   --   --   PLT 284  --   --   --   --   HEPARINUNFRC  --   --  0.47  --  0.41  CREATININE 0.98  --   --   --   --   TROPONINI 0.04* <0.03  --  <0.03  --     CrCl cannot be calculated (Unknown ideal weight.).   Medical History: Past Medical History:  Diagnosis Date  . Anxiety   . CAD (coronary artery disease)   . Cholelithiasis   . Coronary atherosclerosis of native coronary artery 07/2002   Previous patient of [facility]; PCI  2003-90% septal perforator, 90% proximal and mid LAD->DES x2, normal CX, luminal irregularities in RCA, normal EF  . Depression   . Deviated septum   . Diverticulosis   . Erectile dysfunction    Low serum testosterone level  . Essential hypertension, benign   . Hemorrhoid   . Hyperlipidemia    Lipid profile in 03/2012:182, 161,09,604  . MI (myocardial infarction)    Anterior STEMI 8/13  . Obesity   . Obstructive sleep apnea    [facility] - 05/2011  . Tinnitus   . 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 M presented to [facility] hospital with CP. No heparin or Lovenox given at [facility]. Transferred to [facility] for further cardiac work-up.  Started on IV heparin gtt for r/o ACS. CBC ok on admission. Initial heparin level was therapeutic. Tonight the 2nd HL remains therapeutic at 0.41 on heparin 1000 units/hr. No bleeding  noted.  Goal of Therapy:  Heparin level 0.3-0.7 units/ml Monitor platelets by anticoagulation protocol: Yes   Plan:  Continue IV Heparin drip at 1000 units/hr Daily heparin level and CBC   Noah Delaine, RPh Clinical Pharmacist Pager: [phone] 8a-4p [phone] 4P-10P [phone] Main Pharmacy [phone] 12/26/2016,8:29 PM

## 2016-12-26 NOTE — Progress Notes (Addendum)
Patient arrived from [facility]. Denies chest pain. On nitro drip. Paged cardiologist on call, will wait for admission orders. Will continue to monitor patient.

## 2016-12-26 NOTE — Progress Notes (Signed)
ANTICOAGULATION CONSULT NOTE - Initial Consult  Pharmacy Consult for Heparin Indication: chest pain/ACS  Allergies  Allergen Reactions  . Aspirin Anaphylaxis    Throat closes    Patient Measurements: Weight: 187 lb 4.8 oz (85 kg) Heparin Dosing Weight: 79 kg  Vital Signs: Temp: 97.8 F (36.6 C) (05/02 0749) Temp Source: Oral (05/02 0749) BP: 190/89 (05/02 0857) Pulse Rate: 68 (05/02 0857)  Labs:  Recent Labs  12/26/16 0212 12/26/16 0733 12/26/16 1114  HGB 12.9*  --   --   HCT 38.5*  --   --   PLT 284  --   --   HEPARINUNFRC  --   --  0.47  CREATININE 0.98  --   --   TROPONINI 0.04* <0.03  --     CrCl cannot be calculated (Unknown ideal weight.).   Medical History: Past Medical History:  Diagnosis Date  . Anxiety   . CAD (coronary artery disease)   . Cholelithiasis   . Coronary atherosclerosis of native coronary artery 07/2002   Previous patient of [facility]; PCI  2003-90% septal perforator, 90% proximal and mid LAD->DES x2, normal CX, luminal irregularities in RCA, normal EF  . Depression   . Deviated septum   . Diverticulosis   . Erectile dysfunction    Low serum testosterone level  . Essential hypertension, benign   . Hemorrhoid   . Hyperlipidemia    Lipid profile in 03/2012:182, 161,09,604  . MI (myocardial infarction)    Anterior STEMI 8/13  . Obesity   . Obstructive sleep apnea    [facility] - 05/2011  . Tinnitus   . 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 M presented to [facility] hospital with CP. No heparin or Lovenox given at [facility]. Transferred to [facility] for further cardiac work-up. To start heparin gtt for r/o ACS. CBC ok on admission.  Initial HL is therapeutic at 0.47 on heparin 1000 units/hr. No issues with infusion or bleeding noted.  Goal of Therapy:  Heparin level 0.3-0.7 units/ml Monitor platelets by anticoagulation protocol: Yes   Plan:   Heparin 1000 units/hr 8h confirmatory HL Daily heparin level and CBC   Arlean Hopping. Newman Pies, PharmD, BCPS Clinical Pharmacist [phone] 12/26/2016,1:04 PM

## 2016-12-26 NOTE — Progress Notes (Signed)
CRITICAL VALUE ALERT  Critical value received:  Troponin 0.04  Date of notification:  12/26/16  Time of notification: 0200  Critical value read back: Yes  Nurse who received alert:  Sanya Kobrin  MD notified (1st page):  Orson Aloe, MD  Time of first page:  0205  MD notified (2nd page):  Time of second page:  Responding MD: Orson Aloe, MD  Time MD responded:  [phone]

## 2016-12-26 NOTE — Progress Notes (Signed)
ANTICOAGULATION CONSULT NOTE - Initial Consult  Pharmacy Consult for Heparin Indication: chest pain/ACS  Allergies  Allergen Reactions  . Aspirin Anaphylaxis    Throat closes    Patient Measurements: Weight: 187 lb 4.8 oz (85 kg) Heparin Dosing Weight: 79 kg  Vital Signs: Temp: 98.4 F (36.9 C) (05/01 2300) Temp Source: Oral (05/01 2300) BP: 160/83 (05/02 0121) Pulse Rate: 72 (05/02 0121)  Labs:  Recent Labs  12/26/16 0212  HGB 12.9*  HCT 38.5*  PLT 284  CREATININE 0.98  TROPONINI 0.04*    CrCl cannot be calculated (Unknown ideal weight.).   Medical History: Past Medical History:  Diagnosis Date  . Anxiety   . CAD (coronary artery disease)   . Cholelithiasis   . Coronary atherosclerosis of native coronary artery 07/2002   Previous patient of [facility]; PCI  2003-90% septal perforator, 90% proximal and mid LAD->DES x2, normal CX, luminal irregularities in RCA, normal EF  . Depression   . Deviated septum   . Diverticulosis   . Erectile dysfunction    Low serum testosterone level  . Essential hypertension, benign   . Hemorrhoid   . Hyperlipidemia    Lipid profile in 03/2012:182, 161,09,604  . MI (myocardial infarction)    Anterior STEMI 8/13  . Obesity   . Obstructive sleep apnea    [facility] - 05/2011  . Tinnitus   . 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 M presented to [facility] hospital with CP. No heparin or Lovenox given at [facility]. Transferred to [facility] for further cardiac work-up. To start heparin gtt for r/o ACS. CBC ok on admission.  Goal of Therapy:  Heparin level 0.3-0.7 units/ml Monitor platelets by anticoagulation protocol: Yes   Plan:  D/c SQ Lovenox - none given Heparin IV bolus 4000 units Heparin gtt at 1000 units/hr Will f/u heparin level in 8 hours Daily heparin level and CBC  Christoper Fabian, PharmD, BCPS Clinical pharmacist, pager  [phone] 12/26/2016,3:25 AM

## 2016-12-26 NOTE — Care Management Obs Status (Signed)
MEDICARE OBSERVATION STATUS NOTIFICATION   Patient Details  Name: Bradley Hurley MRN: [id number] Date of Birth: 12-Nov-1945   Medicare Observation Status Notification Given:  Yes    Elliot Cousin, RN 12/26/2016, 5:39 PM

## 2016-12-26 NOTE — H&P (Signed)
History & Physical    Patient ID: Bradley Hurley MRN: [id number], DOB/AGE: 71-Dec-1947   Admit date: 12/26/2016   Primary Physician: [name], MD Primary Cardiologist: Dietrich Pates, MD   History of Present Illness    Bradley Hurley is a 71 y.o. male with past medical history of coronary artery disease with a remote history of stents to his LAD in 2003 with subsequent drug eluting stents to his proximal and mid-LAD, essential hypertension, prior tobacco abuse, and obstructive sleep apnea for which he is non-compliant with his CPAP who has been transferred from outside hospital for further evaluation of chest pain.  Bradley Hurley reports at around 3 pm yesterday evening while at his father's home after playing golf, he noticed chest pain at the left side of his chest that went to his left jaw and neck.  The pain is described as pressure and throbbing, moderate intensity, slow onset, and eventually resolving on its own while at the outside hospital emergency department.  The pain lasted approximately 20 minutes according to patient.  Prior to going to the hospital he called his wife to bring his nitroglycerin tablets for which he did not have at the time, and his wife told him to go to the hospital.  Patient drove himself to the hospital to be evaluated.  Patient denies any other associated symptoms such as dizziness, nausea, sweating, or palpitations.  The last time he had chest pain like this was years ago but was in the setting of having a heart attack per patient.  Patient reports that he has not taken any of his medications in the last 2 days due to forgetting.  Other complaints include feeling more tired for several months.  He denies any other symptoms such swelling in his legs, waking up in the middle of the night due to shortness of breath, or problems with laying flat due to shortness of breath. Of note, he does not monitor his blood pressure at home.      Past Medical History      Past Medical History:  Diagnosis Date  . Anxiety   . CAD (coronary artery disease)   . Cholelithiasis   . Coronary atherosclerosis of native coronary artery 07/2002   Previous patient of [facility]; PCI  2003-90% septal perforator, 90% proximal and mid LAD->DES x2, normal CX, luminal irregularities in RCA, normal EF  . Depression   . Deviated septum   . Diverticulosis   . Erectile dysfunction    Low serum testosterone level  . Essential hypertension, benign   . Hemorrhoid   . Hyperlipidemia    Lipid profile in 03/2012:182, 478,29,562  . MI (myocardial infarction)    Anterior STEMI 8/13  . Obesity   . Obstructive sleep apnea    [facility] - 05/2011  . Tinnitus   . 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 APPENDECTOMY    . CARDIAC CATHETERIZATION  07/28/2012   20% distal left main, patent prox & mid LAD stents w/ 25% ISR of the latter (unchanged from 03/2012 cath, mid-LAD stent placed in 2003), 30-40% ostial LCx, 40-50% distal marginal, 30% distal serial RCA stenoses; LVEF 55% with apical HK  . COLONOSCOPY N/A 08/12/2013   Procedure: COLONOSCOPY;  Surgeon: Corbin Ade, MD;  Location: [facility];  Service: Endoscopy;  Laterality: N/A;  10:15  . CORONARY ANGIOPLASTY WITH  STENT PLACEMENT  2003   mid LAD  . CORONARY ANGIOPLASTY WITH STENT PLACEMENT  03/2012   STEMI s/p DES-prox LAD  . LEFT HEART CATHETERIZATION WITH CORONARY ANGIOGRAM N/A 04/10/2012   Procedure: LEFT HEART CATHETERIZATION WITH CORONARY ANGIOGRAM;  Surgeon: Kathleene Hazel, MD;  Location: [facility] CATH LAB;  Service: Cardiovascular;  Laterality: N/A;  . LEFT HEART CATHETERIZATION WITH CORONARY ANGIOGRAM N/A 07/28/2012   Procedure: LEFT HEART CATHETERIZATION WITH CORONARY ANGIOGRAM;  Surgeon: Kathleene Hazel, MD;  Location: [facility] CATH LAB;  Service: Cardiovascular;  Laterality: N/A;  . SPLENECTOMY    . TONSILLECTOMY        Allergies  Allergies  Allergen Reactions  . Aspirin Anaphylaxis    Throat closes     Home Medications    Prior to Admission medications   Medication Sig Start Date End Date Taking? Authorizing Provider  acetaminophen (TYLENOL) 500 MG tablet Take 500 mg by mouth every 6 (six) hours as needed for mild pain.     Historical Provider, MD  atorvastatin (LIPITOR) 20 MG tablet Take 1 tablet (20 mg total) by mouth daily. 07/30/13   Jodelle Gross, NP  atorvastatin (LIPITOR) 20 MG tablet take 1 tablet by mouth once daily 05/02/16   Laqueta Linden, MD  Chlorpheniramine Maleate (ALLERGY PO) Take 1 tablet by mouth daily as needed (allergies).    Historical Provider, MD  EFFIENT 10 MG TABS tablet take 1 tablet by mouth once daily 05/02/16   Laqueta Linden, MD  isosorbide mononitrate (IMDUR) 30 MG 24 hr tablet take 1 tablet by mouth once daily 05/02/16   Laqueta Linden, MD  lisinopril (PRINIVIL,ZESTRIL) 20 MG tablet take 1 tablet by mouth once daily 10/26/16   Jodelle Gross, NP  Multiple Vitamins-Minerals (MEGA MULTI MEN PO) Take 1 tablet by mouth daily as needed (energy).    Historical Provider, MD  nitroGLYCERIN (NITROSTAT) 0.4 MG SL tablet Place 1 tablet (0.4 mg total) under the tongue every 5 (five) minutes as needed for chest pain. 07/30/12   Kathlen Brunswick, MD    Family History    Family History  Problem Relation Age of Onset  . Arrhythmia Father   . Cancer Mother     Social History    Social History   Social History  . Marital status: Married    Spouse name: N/A  . Number of children: N/A  . Years of education: N/A   Occupational History  . 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 Smoking status: Former Smoker    Packs/day: 1.00    Years: 15.00    Types: Cigarettes    Quit date: 09/03/1995  . Smokeless tobacco: Former Neurosurgeon    Quit date: 01/30/1981     Comment: Quit in 1982  . Alcohol use 0.6 oz/week    1 Standard drinks  or equivalent per week     Comment: occ  . Drug use: No  . Sexual activity: Yes   Other Topics Concern  . 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     Review of Systems    All other systems reviewed and are otherwise negative except as noted above.  Physical Exam    Blood pressure (!) 161/92, pulse 83, temperature 98.4 F (36.9 C), temperature source Oral, resp. rate 19, weight 85 kg (187 lb 4.8 oz), SpO2 96 %.  General: Well developed,  well nourished,male in no acute distress. Head: Normocephalic, atraumatic, sclera non-icteric, no xanthomas, nares are without discharge. Dentition:  Neck: No carotid bruits. JVD not elevated.  Lungs: Respirations regular and unlabored, without wheezes or rales.  Heart: Regular rate and rhythm. No S3 or S4.  No murmur, no rubs, or gallops appreciated. Abdomen: Soft, non-tender, non-distended with normoactive bowel sounds. No hepatomegaly. No rebound/guarding. No obvious abdominal masses. Msk:  Strength and tone appear normal for age. No joint deformities or effusions. Extremities: No clubbing or cyanosis. No edema.  Distal pedal pulses are 2+ bilaterally. Neuro: Alert and oriented X 3. Moves all extremities spontaneously. No focal deficits noted. Psych:  Responds to questions appropriately with a normal affect. Skin: No rashes or lesions noted  Labs    Troponin (Point of Care Test) No results for input(s): TROPIPOC in the last 72 hours. No results for input(s): CKTOTAL, CKMB, TROPONINI in the last 72 hours. Lab Results  Component Value Date   WBC 8.2 02/08/2015   HGB 14.7 02/08/2015   HCT 43.4 02/08/2015   MCV 91.0 02/08/2015   PLT 356 02/08/2015   No results for input(s): NA, K, CL, CO2, BUN, CREATININE, CALCIUM, PROT, BILITOT, ALKPHOS, ALT, AST, GLUCOSE in the last 168 hours.  Invalid input(s): LABALBU Lab Results  Component Value Date   CHOL 148 03/03/2014   HDL 44 03/03/2014   LDLCALC 86  03/03/2014   TRIG 92 03/03/2014   No results found for: DDIMER  No results found for: BNP No results found for: PROBNP No results for input(s): INR in the last 72 hours.    Radiology Studies    No results found.  EKG & Cardiac Imaging    EKG:  NSR; normal ECG  ECHOCARDIOGRAM: 02/09/15 - Left ventricle: The cavity size was normal. Wall thickness was   increased in a pattern of mild LVH. Systolic function was normal.   The estimated ejection fraction was in the Hurley of 60% to 65%.   Wall motion was normal; there were no regional wall motion   abnormalities. Doppler parameters are consistent with abnormal   left ventricular relaxation (grade 1 diastolic dysfunction). - Aortic valve: Mildly calcified annulus. Trileaflet; mildly   thickened leaflets. - Mitral valve: Mildly thickened leaflets . There was mild   regurgitation. - Left atrium: The atrium was mildly dilated. - Right atrium: Central venous pressure (est): 3 mm Hg. - Tricuspid valve: There was trivial regurgitation.  Left Heart Catheterization: 07/28/12 Impression: 1. Double vessel CAD with patent stents in the proximal and mid LAD with diffuse small vessel disease in LAD and Circumflex 2. Preserved LV systolic function  04/10/12 Impression: 1. Acute anterior STEMI 2. Occluded mid LAD, now s/p successful thrombectomy and PTCA with placement of 2 DES in the proximal and mid LAD.  3. Segment LV systolic dysfunction  Assessment & Plan    Active Problems:   * No active hospital problems. *  # Chest pain: Patient with chest pain concerning for angina in the setting of a reported blood pressure at the outside hospital of 208/125 mmHg and negative troponin level.  Patient may be suffering from hypertensive urgency.  Must consider unstable angina, especially in the setting of medication non-compliance, but suspicion is his symptoms are likely related to his uncontrolled hypertension (diastolic blood pressure 125  mmHg).  At this time, patient is chest pain free.  ECG is without any signs of ischemia/infarction.  His initial troponin is negative.  Prior to transfer, patient was  placed on a nitroglycerin drip for blood pressure control and given his prasugrel. - Wean off of nitroglycerin drip and restart home lisinopril. - Start carvedilol at 12.5 mg/daily base on known history of coronary artery disease. - Trend troponin level. - Can arrange for a potential outpatient or inpatient stress once blood pressure is under control for further risk stratification.  # Coronary artery disease: Patient with known history of coronary artery disease now status post multiple stents to his LAD.  Here now with chest pain the setting of hypertensive urgency.  He has an aspirin allergy and only take prasugrel for antiplatelet therapy.  He is currently on Lipitor 20 mg/daily. - Restart home medications. - Add carvedilol.    # Hypertension: Patient with known essential hypertension now here with hypertensive urgency.  Blood pressure has improved with starting nitroglycerin drip at the outside hospital.  He has no known overt end-organ damage at this time. - Medication changes as stated above.    # Obstructive sleep apnea Patient with history of obstructive sleep apnea but non-compliant with his CPAP due to how it feels.  He does report feeling more tired lately. - Encouraged patient to restart c-pap  # Prophylaxis - Lovenox subQ  Signed, Judie Grieve, MD 12/26/2016, 12:53 AM

## 2016-12-26 NOTE — Care Management Note (Signed)
Case Management Note  Patient Details  Name: Bradley Hurley MRN: [id number] Date of Birth: Dec 17, 1945  Subjective/Objective:     Chest pain, with hx CAD, HTN urgency             Action/Plan: Discharge Planning: NCM spoke to pt and lives at home with wife. Waiting final recommendation for home. Will continue to follow for dc needs.    Expected Discharge Date:                  Expected Discharge Plan:  Home/Self Care  In-House Referral:  NA  Discharge planning Services  CM Consult  Post Acute Care Choice:  NA Choice offered to:  NA  DME Arranged:  N/A DME Agency:  NA  HH Arranged:  NA HH Agency:  NA  Status of Service:  In process, will continue to follow  If discussed at Long Length of Stay Meetings, dates discussed:    Additional Comments:  Elliot Cousin, RN 12/26/2016, 5:41 PM

## 2016-12-27 ENCOUNTER — Observation Stay (HOSPITAL_BASED_OUTPATIENT_CLINIC_OR_DEPARTMENT_OTHER): Payer: Medicare Other

## 2016-12-27 DIAGNOSIS — G4733 Obstructive sleep apnea (adult) (pediatric): Secondary | ICD-10-CM

## 2016-12-27 DIAGNOSIS — R001 Bradycardia, unspecified: Secondary | ICD-10-CM | POA: Diagnosis not present

## 2016-12-27 DIAGNOSIS — I2510 Atherosclerotic heart disease of native coronary artery without angina pectoris: Secondary | ICD-10-CM | POA: Diagnosis not present

## 2016-12-27 DIAGNOSIS — R079 Chest pain, unspecified: Secondary | ICD-10-CM | POA: Diagnosis not present

## 2016-12-27 DIAGNOSIS — I160 Hypertensive urgency: Secondary | ICD-10-CM | POA: Diagnosis not present

## 2016-12-27 DIAGNOSIS — E7800 Pure hypercholesterolemia, unspecified: Secondary | ICD-10-CM | POA: Diagnosis not present

## 2016-12-27 DIAGNOSIS — I10 Essential (primary) hypertension: Secondary | ICD-10-CM | POA: Diagnosis not present

## 2016-12-27 DIAGNOSIS — R0789 Other chest pain: Secondary | ICD-10-CM | POA: Diagnosis not present

## 2016-12-27 DIAGNOSIS — Z87891 Personal history of nicotine dependence: Secondary | ICD-10-CM | POA: Diagnosis not present

## 2016-12-27 DIAGNOSIS — Z79899 Other long term (current) drug therapy: Secondary | ICD-10-CM | POA: Diagnosis not present

## 2016-12-27 LAB — CBC
HCT: 36.8 % — ABNORMAL LOW (ref 39.0–52.0)
HEMOGLOBIN: 12.4 g/dL — AB (ref 13.0–17.0)
MCH: 30.3 pg (ref 26.0–34.0)
MCHC: 33.7 g/dL (ref 30.0–36.0)
MCV: 90 fL (ref 78.0–100.0)
Platelets: 282 10*3/uL (ref 150–400)
RBC: 4.09 MIL/uL — ABNORMAL LOW (ref 4.22–5.81)
RDW: 16.1 % — ABNORMAL HIGH (ref 11.5–15.5)
WBC: 8.6 10*3/uL (ref 4.0–10.5)

## 2016-12-27 LAB — NM MYOCAR MULTI W/SPECT W/WALL MOTION / EF
Peak HR: 78 {beats}/min
Rest HR: 78 {beats}/min

## 2016-12-27 LAB — HEPARIN LEVEL (UNFRACTIONATED): Heparin Unfractionated: 0.36 IU/mL (ref 0.30–0.70)

## 2016-12-27 MED ORDER — TECHNETIUM TC 99M TETROFOSMIN IV KIT
10.0000 | PACK | Freq: Once | INTRAVENOUS | Status: AC
  Administered 2016-12-27: 10 via INTRAVENOUS

## 2016-12-27 MED ORDER — ATORVASTATIN CALCIUM 40 MG PO TABS
40.0000 mg | ORAL_TABLET | Freq: Every day | ORAL | Status: DC
  Administered 2016-12-27: 40 mg via ORAL
  Filled 2016-12-27: qty 1

## 2016-12-27 MED ORDER — REGADENOSON 0.4 MG/5ML IV SOLN
0.4000 mg | Freq: Once | INTRAVENOUS | Status: AC
  Administered 2016-12-27: 0.4 mg via INTRAVENOUS
  Filled 2016-12-27: qty 5

## 2016-12-27 MED ORDER — ISOSORBIDE MONONITRATE ER 30 MG PO TB24: 30.0000 mg | ORAL_TABLET | Freq: Every day | ORAL | 11 refills | Status: DC

## 2016-12-27 MED ORDER — ATORVASTATIN CALCIUM 80 MG PO TABS
80.0000 mg | ORAL_TABLET | Freq: Every day | ORAL | Status: DC
Start: 2016-12-28 — End: 2016-12-27

## 2016-12-27 MED ORDER — CARVEDILOL 6.25 MG PO TABS
6.2500 mg | ORAL_TABLET | Freq: Two times a day (BID) | ORAL | Status: DC
  Administered 2016-12-27: 6.25 mg via ORAL
  Filled 2016-12-27: qty 1

## 2016-12-27 MED ORDER — REGADENOSON 0.4 MG/5ML IV SOLN
INTRAVENOUS | Status: AC
  Administered 2016-12-27: 0.4 mg via INTRAVENOUS
  Filled 2016-12-27: qty 5

## 2016-12-27 MED ORDER — CARVEDILOL 6.25 MG PO TABS: 6.2500 mg | ORAL_TABLET | Freq: Two times a day (BID) | ORAL | 3 refills | Status: DC

## 2016-12-27 MED ORDER — ATORVASTATIN CALCIUM 80 MG PO TABS: 80.0000 mg | ORAL_TABLET | Freq: Every evening | ORAL | 3 refills | Status: DC

## 2016-12-27 NOTE — Progress Notes (Signed)
Patient received discharge instructions, educated about change of BP medications. IV access removed by day shift RN. Patient had no questions or concerns. Patient discharged. Refused wheelchair, ambulated to car outside with nurse.

## 2016-12-27 NOTE — Progress Notes (Signed)
Progress Note  Patient Name: Bradley Hurley Date of Encounter: 12/27/2016  Primary Cardiologist: Dr. Tenny Crawoss  Subjective   No chest pain or SOB. No orthopnea.   Inpatient Medications    Scheduled Meds: . atorvastatin  20 mg Oral q1800  . carvedilol  12.5 mg Oral BID WC  . isosorbide mononitrate  30 mg Oral Daily  . lisinopril  20 mg Oral Daily  . prasugrel  10 mg Oral Daily  . sodium chloride flush  3 mL Intravenous Q12H   Continuous Infusions: . heparin 1,000 Units/hr (12/26/16 2000)  . nitroGLYCERIN Stopped (12/26/16 0700)   PRN Meds: nitroGLYCERIN   Vital Signs    Vitals:   12/26/16 2015 12/26/16 2021 12/26/16 2300 12/27/16 0620  BP:  112/64 123/63 127/68  Pulse: 61 (!) 57 63 (!) 57  Resp: (!) 21 18 11 16   Temp:  98.1 F (36.7 C) 97.7 F (36.5 C) 97.8 F (36.6 C)  TempSrc:  Oral Oral Oral  SpO2: 95% 94% 95% 96%  Weight:    187 lb 6.4 oz (85 kg)    Intake/Output Summary (Last 24 hours) at 12/27/16 0808 Last data filed at 12/27/16 0600  Gross per 24 hour  Intake            965.5 ml  Output             1050 ml  Net            -84.5 ml   Filed Weights   12/25/16 2300 12/27/16 0620  Weight: 187 lb 4.8 oz (85 kg) 187 lb 6.4 oz (85 kg)    Telemetry    NSR/SB mid 50s overnight - Personally Reviewed  Physical Exam   GEN: No acute distress.  HEENT: Normocephalic, atraumatic, sclera non-icteric. Neck: No JVD or bruits. Cardiac: RRR no murmurs, rubs, or gallops.  Radials/DP/PT 1+ and equal bilaterally.  Respiratory: Clear to auscultation bilaterally. Breathing is unlabored. GI: Soft, nontender, non-distended, BS +x 4. MS: no deformity. Extremities: No clubbing or cyanosis. No edema. Distal pedal pulses are 2+ and equal bilaterally. Neuro:  AAOx3. Follows commands. Psych:  Responds to questions appropriately with a normal affect.  Labs    Chemistry Recent Labs Lab 12/26/16 0212  CREATININE 0.98  GFRNONAA >60  GFRAA >60     Hematology Recent  Labs Lab 12/26/16 0212 12/27/16 0229  WBC 8.7 8.6  RBC 4.31 4.09*  HGB 12.9* 12.4*  HCT 38.5* 36.8*  MCV 89.3 90.0  MCH 29.9 30.3  MCHC 33.5 33.7  RDW 15.6* 16.1*  PLT 284 282    Cardiac Enzymes Recent Labs Lab 12/26/16 0212 12/26/16 0733 12/26/16 1448  TROPONINI 0.04* <0.03 <0.03   Na 140, K 3.9, Cl 104, Co2 25, BUN 18, Cr 1.00, glu 111  Radiology    No results found.  Cardiac Studies   Pending nuc.  Patient Profile     71 y.o. male with CAD (remote stenting to LAD in 2003, anterior STEMI 03/2012 s/p DES to prox-mid LAD, essential hypertension, prior tobacco abuse, obstructive sleep apnea (noncompliant with CPAP), anxiety, diverticulosis, HLD who was transferred from outside hospital for further evaluation of chest pain. Found to have hypertensive urgency in the setting of noncompliance with BP medicines for several days - minimal trop bump to 0.04.   Assessment & Plan    1. Chest pain with hx of CAD - plan stress test today. Continue IV heparin, Effient, BB, imdur and lisinopril (aspirin allergic).  2.  Hypertensive urgency - improved on current regimen.  3. HLD - consider titration of atorvastatin to 40mg  daily given known CAD and LDL 86  4. OSA - non compliant with CPAP, encouraged to restart at home.  5. Sinus bradycardia - HR intermittently 50s on current regimen. Continue beta blocker but follow for any symptoms.  Signed, Laurann Montana, PA-C  12/27/2016, 8:08 AM

## 2016-12-27 NOTE — Discharge Summary (Signed)
Discharge Summary    Patient ID: Bradley Hurley,  MRN: [id number], DOB/AGE: 09-13-1945 71 y.o.  Admit date: 12/26/2016 Discharge date: 12/27/2016  Primary Care Provider: [name] Primary Cardiologist: Dr. Tenny Craw   Discharge Diagnoses    Principal Problem:   Chest pain with moderate risk for cardiac etiology Active Problems:   Hypertensive urgency   HTN (hypertension)   CAD (coronary artery disease)   Hyperlipidemia   Aspirin allergy   Obstructive sleep apnea   Anxiety   Sinus bradycardia    Diagnostic Studies/Procedures    N/A _____________     History of Present Illness     Bradley Hurley is a 71 y.o. male with history of CAD (remote stenting to LAD in 2003, anterior STEMI 03/2012 s/p DES to prox-mid LAD, essential hypertension, prior tobacco abuse, obstructive sleep apnea (noncompliant with CPAP), anxiety, diverticulosis, HLD who was transferred from outside hospital for further evaluation of chest pain. While playing golf day prior to admission he began feeling chest pressure lasting about 20 minutes without any other symptoms. He had not taken any of his medications in the last 2 days due to forgetting. He was found to have hypertensive urgency and transferred to [facility] for further evaluation.  Hospital Course    Initial troponin at OSH was unremarkable, EKG without signs of ischemia/infarction. LFTs wnl at OSH. He had minimal troponin bump to 0.04 here at [facility] followed by subsequent normal levels. He was placed on carvedilol - initially at 12.5mg  daily but this was later reduced to 6.25mg  BID due to bradycardia HR in the low 50s. Lisinopril and Imdur were resumed. Blood pressure improved. Nuclear stress test showed:  1. EF 52%, mild diffuse hypokinesis.  2. Primarily fixed medium-sized basal to mid inferior and basal to mid inferoseptal perfusion defect.  There does not appear to be significant ischemia.  Possible prior infarction versus diaphragmatic attenuation.    Given lack of ischemia, overall low risk study  His chest pain was felt due to hypertensive urgency. He is chest pain free today. Dr. Duke Salvia has seen and examined the patient today and feels he is stable for discharge. We have also increased his atorvastatin to 80mg  due to LDL 86. If the patient is tolerating statin at time of follow-up appointment, would consider rechecking liver function/lipid panel in 6-8 weeks. I have sent a message to our [place] office's scheduler requesting a follow-up appointment, and our office will call the patient with this information.   Discharge Vitals Blood pressure (!) 153/77, pulse 69, temperature 99.4 F (37.4 C), temperature source Oral, resp. rate (!) 21, weight 187 lb 6.4 oz (85 kg), SpO2 97 %.  Filed Weights   12/25/16 2300 12/27/16 0620  Weight: 187 lb 4.8 oz (85 kg) 187 lb 6.4 oz (85 kg)    Labs & Radiologic Studies    CBC  Recent Labs  12/26/16 0212 12/27/16 0229  WBC 8.7 8.6  HGB 12.9* 12.4*  HCT 38.5* 36.8*  MCV 89.3 90.0  PLT 284 282   Basic Metabolic Panel  Recent Labs  12/26/16 0212  CREATININE 0.98   Cardiac Enzymes  Recent Labs  12/26/16 0212 12/26/16 0733 12/26/16 1448  TROPONINI 0.04* <0.03 <0.03   Fasting Lipid Panel  Recent Labs  12/26/16 1114  CHOL 151  HDL 38*  LDLCALC 86  TRIG 782  CHOLHDL 4.0   _____________  Nm Myocar Multi W/spect W/wall Motion / Ef  Result Date: 12/27/2016  There was no ST  segment deviation noted during stress.  This is a low risk study.  The left ventricular ejection fraction is mildly decreased (45-54%).  1. EF 52%, mild diffuse hypokinesis. 2. Primarily fixed medium-sized basal to mid inferior and basal to mid inferoseptal perfusion defect.  There does not appear to be significant ischemia.  Possible prior infarction versus diaphragmatic attenuation. Given lack of ischemia, overall low risk study.   Disposition   Pt is being discharged home today in good  condition.  Follow-up Plans & Appointments     Discharge Instructions    Diet - low sodium heart healthy    Complete by:  As directed    Increase activity slowly    Complete by:  As directed    A new blood pressure medicine called carvedilol has been added to your regimen. Your atorvastatin was increased due to your high cholesterol.      Discharge Medications   Allergies as of 12/27/2016      Reactions   Aspirin Anaphylaxis   Throat closes      Medication List    TAKE these medications   atorvastatin 80 MG tablet Commonly known as:  LIPITOR Take 1 tablet (80 mg total) by mouth every evening. What changed:  See the new instructions.   carvedilol 6.25 MG tablet Commonly known as:  COREG Take 1 tablet (6.25 mg total) by mouth 2 (two) times daily with a meal. Start taking on:  12/28/2016   EFFIENT 10 MG Tabs tablet Generic drug:  prasugrel take 1 tablet by mouth once daily What changed:  See the new instructions.   isosorbide mononitrate 30 MG 24 hr tablet Commonly known as:  IMDUR take 1 tablet by mouth once daily   lisinopril 20 MG tablet Commonly known as:  PRINIVIL,ZESTRIL take 1 tablet by mouth once daily   MEGA MULTI MEN PO Take 1 tablet by mouth daily as needed (energy).   nitroGLYCERIN 0.4 MG SL tablet Commonly known as:  NITROSTAT Place 1 tablet (0.4 mg total) under the tongue every 5 (five) minutes as needed for chest pain.        Allergies:  Allergies  Allergen Reactions  . Aspirin Anaphylaxis    Throat closes     Outstanding Labs/Studies   n/a  Duration of Discharge Encounter   Greater than 30 minutes including physician time.  Signed, Laurann Montanaayna N Dunn PA-C 12/27/2016, 6:09 PM

## 2016-12-27 NOTE — Progress Notes (Signed)
ANTICOAGULATION CONSULT NOTE -Follow up  Pharmacy Consult for Heparin Indication: chest pain/ACS  Allergies  Allergen Reactions  . Aspirin Anaphylaxis    Throat closes    Patient Measurements: Weight: 187 lb 6.4 oz (85 kg) Heparin Dosing Weight: 79 kg  Vital Signs: Temp: 97.8 F (36.6 C) (05/03 0620) Temp Source: Oral (05/03 0620) BP: 127/68 (05/03 0620) Pulse Rate: 57 (05/03 0620)  Labs:  Recent Labs  12/26/16 0212 12/26/16 0733 12/26/16 1114 12/26/16 1448 12/26/16 1943 12/27/16 0229  HGB 12.9*  --   --   --   --  12.4*  HCT 38.5*  --   --   --   --  36.8*  PLT 284  --   --   --   --  282  HEPARINUNFRC  --   --  0.47  --  0.41 0.36  CREATININE 0.98  --   --   --   --   --   TROPONINI 0.04* <0.03  --  <0.03  --   --     CrCl cannot be calculated (Unknown ideal weight.).   Medical History: Past Medical History:  Diagnosis Date  . Anxiety   . CAD (coronary artery disease)   . Cholelithiasis   . Coronary atherosclerosis of native coronary artery 07/2002   Previous patient of [facility]; PCI  2003-90% septal perforator, 90% proximal and mid LAD->DES x2, normal CX, luminal irregularities in RCA, normal EF  . Depression   . Deviated septum   . Diverticulosis   . Erectile dysfunction    Low serum testosterone level  . Essential hypertension, benign   . Hemorrhoid   . Hyperlipidemia    Lipid profile in 03/2012:182, 161,09,[id number],48,112  . MI (myocardial infarction)    Anterior STEMI 8/13  . Obesity   . Obstructive sleep apnea    [facility] - 05/2011  . Tinnitus   . 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 M [facility]. [facility] heparin [facility]. [facility]. Started heparin for r/o ACS. CBC ok on admission.  This morning's heparin level remains therapeutic at 0.36 on heparin 1000 units/hr. No issues with  infusion or bleeding noted. With heparin level trending down, will increase slightly to maintain therapeutic range.  Goal of Therapy:  Heparin level 0.3-0.7 units/ml Monitor platelets by anticoagulation protocol: Yes   Plan:  Increase heparin to 1050 units/hr Daily heparin level and CBC   Arlean Hoppingorey M. Newman PiesBall, PharmD, BCPS Clinical Pharmacist [phone] 12/27/2016,7:51 AM

## 2016-12-31 DIAGNOSIS — F419 Anxiety disorder, unspecified: Secondary | ICD-10-CM | POA: Diagnosis not present

## 2016-12-31 DIAGNOSIS — R079 Chest pain, unspecified: Secondary | ICD-10-CM | POA: Diagnosis not present

## 2016-12-31 DIAGNOSIS — I25119 Atherosclerotic heart disease of native coronary artery with unspecified angina pectoris: Secondary | ICD-10-CM | POA: Diagnosis not present

## 2016-12-31 DIAGNOSIS — Z9861 Coronary angioplasty status: Secondary | ICD-10-CM | POA: Diagnosis not present

## 2017-01-09 ENCOUNTER — Ambulatory Visit (INDEPENDENT_AMBULATORY_CARE_PROVIDER_SITE_OTHER): Payer: Medicare Other | Admitting: Physician Assistant

## 2017-01-09 ENCOUNTER — Encounter: Payer: Self-pay | Admitting: Physician Assistant

## 2017-01-09 VITALS — BP 150/90 | HR 64 | Ht 65.0 in | Wt 187.0 lb

## 2017-01-09 DIAGNOSIS — I2510 Atherosclerotic heart disease of native coronary artery without angina pectoris: Secondary | ICD-10-CM | POA: Diagnosis not present

## 2017-01-09 DIAGNOSIS — E7800 Pure hypercholesterolemia, unspecified: Secondary | ICD-10-CM

## 2017-01-09 DIAGNOSIS — I160 Hypertensive urgency: Secondary | ICD-10-CM

## 2017-01-09 DIAGNOSIS — R001 Bradycardia, unspecified: Secondary | ICD-10-CM | POA: Diagnosis not present

## 2017-01-09 MED ORDER — LISINOPRIL 40 MG PO TABS
40.0000 mg | ORAL_TABLET | Freq: Every day | ORAL | 3 refills | Status: DC
Start: 2017-01-09 — End: 2019-01-20

## 2017-01-09 NOTE — Progress Notes (Signed)
Cardiology Office Note    Date:  01/09/2017   ID:  Bradley, Hurley [birth year], MRN [id number]  PCP:  Gareth Morgan, MD  Cardiologist: previously seen by Dr. Tenny Craw & Dr. Purvis Sheffield  No chief complaint on file.   History of Present Illness:  Bradley Hurley is a 70 y.o. male with history of CAD (remote stenting to LAD in 2003, anterior STEMI 03/2012 s/p DES to prox-mid LAD, essential hypertension, prior tobacco abuse, obstructive sleep apnea (noncompliant with CPAP), anxiety, diverticulosis, HLD   Patient had forgotten to take his medications for 2 days and was admitted with hypertensive urgency and chest pain. Patient had minimal troponin bump of 0.04. Carvedilol was decreased because of bradycardia. Nuclear stress test low risk study EF 52% with mild diffuse hypokinesis primarily fixed medium-sized basal to mid inferior basal to mid inferior septal perfusion defect. There does not appear to be significant ischemia. Possible prior infarct versus diaphragmatic attenuation. Chest pain was felt secondary to hypertensive urgency. Atorvastatin was increased to 80 mg because of LDL of 86.  Patient comes in today accompanied by his wife. His BP has been running 150-180/80-90 at home. Doesn't eat well and gets extra salt. Also complains of excessive fatigue by the time he gets to 15th hole golfing. No chest pain, dyspnea, dizziness or presyncope. Denies any further chest pain.       Past Medical History:  Diagnosis Date  . Anxiety   . CAD (coronary artery disease)   . Cholelithiasis   . Coronary atherosclerosis of native coronary artery 07/2002   Previous patient of [facility]; PCI  2003-90% septal perforator, 90% proximal and mid LAD->DES x2, normal CX, luminal irregularities in RCA, normal EF  . Depression   . Deviated septum   . Diverticulosis   . Erectile dysfunction    Low serum testosterone level  . Essential hypertension, benign   . Hemorrhoid   . Hyperlipidemia    Lipid profile  in 03/2012:182, 308,65,784  . MI (myocardial infarction) (HCC)    Anterior STEMI 8/13  . Obesity   . Obstructive sleep apnea    [facility] - 05/2011  . Tinnitus   . 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 APPENDECTOMY    . CARDIAC CATHETERIZATION  07/28/2012   20% distal left main, patent prox & mid LAD stents w/ 25% ISR of the latter (unchanged from 03/2012 cath, mid-LAD stent placed in 2003), 30-40% ostial LCx, 40-50% distal marginal, 30% distal serial RCA stenoses; LVEF 55% with apical HK  . COLONOSCOPY N/A 08/12/2013   Procedure: COLONOSCOPY;  Surgeon: Corbin Ade, MD;  Location: [facility];  Service: Endoscopy;  Laterality: N/A;  10:15  . CORONARY ANGIOPLASTY WITH STENT PLACEMENT  2003   mid LAD  . CORONARY ANGIOPLASTY WITH STENT PLACEMENT  03/2012   STEMI s/p DES-prox LAD  . LEFT HEART CATHETERIZATION WITH CORONARY ANGIOGRAM N/A 04/10/2012   Procedure: LEFT HEART CATHETERIZATION WITH CORONARY ANGIOGRAM;  Surgeon: Kathleene Hazel, MD;  Location: [facility] CATH LAB;  Service: Cardiovascular;  Laterality: N/A;  . LEFT HEART CATHETERIZATION WITH CORONARY ANGIOGRAM N/A 07/28/2012   Procedure: LEFT HEART CATHETERIZATION WITH CORONARY ANGIOGRAM;  Surgeon: Kathleene Hazel, MD;  Location: [facility] LAB;  Service: Cardiovascular;  Laterality: N/A;  . SPLENECTOMY    . TONSILLECTOMY      Current Medications: Outpatient Medications Prior to Visit  Medication Sig Dispense Refill  . atorvastatin (LIPITOR) 80 MG tablet Take 1 tablet (80 mg total) by mouth every evening. 30 tablet 3  . carvedilol (COREG) 6.25 MG tablet Take 1 tablet (6.25 mg total) by mouth 2 (two) times daily with a meal. 60 tablet 3  . EFFIENT 10 MG TABS tablet take 1 tablet by mouth once daily (Patient taking differently: take 5mg  tablet by mouth once daily) 30 tablet 11  . isosorbide mononitrate (IMDUR) 30 MG 24 hr tablet Take 1  tablet (30 mg total) by mouth daily. 30 tablet 11  . Multiple Vitamins-Minerals (MEGA MULTI MEN PO) Take 1 tablet by mouth daily as needed (energy).    . nitroGLYCERIN (NITROSTAT) 0.4 MG SL tablet Place 1 tablet (0.4 mg total) under the tongue every 5 (five) minutes as needed for chest pain. 50 tablet 6  . lisinopril (PRINIVIL,ZESTRIL) 20 MG tablet take 1 tablet by mouth once daily 30 tablet 3   No facility-administered medications prior to visit.      Allergies:   Aspirin   Social History   Social History  . Marital status: Married    Spouse name: N/A  . Number of children: N/A  . Years of education: N/A   Occupational History  . 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 Smoking status: Former Smoker    Packs/day: 1.00    Years: 15.00    Types: Cigarettes    Quit date: 09/03/1995  . Smokeless tobacco: Former Neurosurgeon    Quit date: 01/30/1981     Comment: Quit in 1982  . Alcohol use 0.6 oz/week    1 Standard drinks or equivalent per week     Comment: occ  . Drug use: No  . Sexual activity: Yes   Other Topics Concern  . 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     Family History:  The patient's   family history includes Arrhythmia in his father; Cancer in his mother.   ROS:   Please see the history of present illness.    Review of Systems  Constitution: Positive for weakness and malaise/fatigue.  HENT: Negative.   Cardiovascular: Negative.   Respiratory: Negative.   Endocrine: Negative.   Hematologic/Lymphatic: Negative.   Musculoskeletal: Negative.   Gastrointestinal: Negative.   Genitourinary: Negative.    All other systems reviewed and are negative.   PHYSICAL EXAM:   VS:  BP (!) 150/90   Pulse 64   Ht 5\' 5"  (1.651 m)   Wt 187 lb (84.8 kg)   SpO2 96%   BMI 31.12 kg/m   Physical Exam  GEN: Obese, in no acute distress  Neck: no JVD, carotid bruits, or masses Cardiac:RRR; no murmurs, rubs, or  gallops  Respiratory:  clear to auscultation bilaterally, normal work of breathing GI: soft, nontender, nondistended, + BS Ext: without cyanosis, clubbing, or edema, Good distal pulses bilaterally Neuro:  Alert and Oriented x 3 Psych: euthymic mood, full affect  Wt Readings from Last 3 Encounters:  01/09/17 187 lb (84.8 kg)  12/27/16 187 lb 6.4 oz (85 kg)  03/02/16 190 lb (86.2 kg)      Studies/Labs Reviewed:   EKG:  EKG is  Not ordered today.    Recent Labs: 12/26/2016: Creatinine, Ser 0.98 12/27/2016: Hemoglobin 12.4; Platelets 282   Lipid Panel    Component Value Date/Time   CHOL 151 12/26/2016 1114   TRIG  134 12/26/2016 1114   HDL 38 (L) 12/26/2016 1114   CHOLHDL 4.0 12/26/2016 1114   VLDL 27 12/26/2016 1114   LDLCALC 86 12/26/2016 1114    Additional studies/ records that were reviewed today include:  Nuclear stress test 12/27/16  There was no ST segment deviation noted during stress.  This is a low risk study.  The left ventricular ejection fraction is mildly decreased (45-54%).   1. EF 52%, mild diffuse hypokinesis.  2. Primarily fixed medium-sized basal to mid inferior and basal to mid inferoseptal perfusion defect.  There does not appear to be significant ischemia.  Possible prior infarction versus diaphragmatic attenuation.    Given lack of ischemia, overall low risk study.       ASSESSMENT:    1. Hypertensive urgency   2. Coronary artery disease involving native coronary artery of native heart without angina pectoris   3. Sinus bradycardia   4. Pure hypercholesterolemia      PLAN:  In order of problems listed above:  Hypertensive urgencyPatient's blood pressure is still elevated and has been running high at home. Will increase lisinopril to 40 mg once daily. 2 g sodium diet. Come back in 2 weeks for blood pressure check. They also keep track of it at home.  CAD Remote stenting to the LAD in 2003, anterior STEMI 2013 with DES to the proximal to mid  LAD, low risk nuclear stress test 12/27/16. No recurrent chest pain. Continue Imdur, aspirin, and  low dose carvedilol.  Sinus bradycardia in the hospital carvedilol decreased  Hypercholesterolemia on atorvastatin which was increased in the hospital. Will need fasting lipid panel and LFTs in 6 weeks.    Medication Adjustments/Labs and Tests Ordered: Current medicines are reviewed at length with the patient today.  Concerns regarding medicines are outlined above.  Medication changes, Labs and Tests ordered today are listed in the Patient Instructions below. Patient Instructions  Your physician recommends that you schedule a follow-up appointment in: 2 Weeks with Joni ReiningKathryn Lawrence, NP   Your physician has recommended you make the following change in your medication:   Increase Lisinopril to 40 mg Daily   If you need a refill on your cardiac medications before your next appointment, please call your pharmacy.  Thank you for choosing [facility]!    DASH Eating Plan DASH stands for "Dietary Approaches to Stop Hypertension." The DASH eating plan is a healthy eating plan that has been shown to reduce high blood pressure (hypertension). It may also reduce your risk for type 2 diabetes, heart disease, and stroke. The DASH eating plan may also help with weight loss. What are tips for following this plan? General guidelines   Avoid eating more than 2,300 mg (milligrams) of salt (sodium) a day. If you have hypertension, you may need to reduce your sodium intake to 1,500 mg a day.  Limit alcohol intake to no more than 1 drink a day for nonpregnant women and 2 drinks a day for men. One drink equals 12 oz of beer, 5 oz of wine, or 1 oz of hard liquor.  Work with your health care provider to maintain a healthy body weight or to lose weight. Ask what an ideal weight is for you.  Get at least 30 minutes of exercise that causes your heart to beat faster (aerobic exercise) most days of the  week. Activities may include walking, swimming, or biking.  Work with your health care provider or diet and nutrition specialist (dietitian) to adjust your  eating plan to your individual calorie needs. Reading food labels   Check food labels for the amount of sodium per serving. Choose foods with less than 5 percent of the Daily Value of sodium. Generally, foods with less than 300 mg of sodium per serving fit into this eating plan.  To find whole grains, look for the word "whole" as the first word in the ingredient list. Shopping   Buy products labeled as "low-sodium" or "no salt added."  Buy fresh foods. Avoid canned foods and premade or frozen meals. Cooking   Avoid adding salt when cooking. Use salt-free seasonings or herbs instead of table salt or sea salt. Check with your health care provider or pharmacist before using salt substitutes.  Do not fry foods. Cook foods using healthy methods such as baking, boiling, grilling, and broiling instead.  Cook with heart-healthy oils, such as olive, canola, soybean, or sunflower oil. Meal planning    Eat a balanced diet that includes:  5 or more servings of fruits and vegetables each day. At each meal, try to fill half of your plate with fruits and vegetables.  Up to 6-8 servings of whole grains each day.  Less than 6 oz of lean meat, poultry, or fish each day. A 3-oz serving of meat is about the same size as a deck of cards. One egg equals 1 oz.  2 servings of low-fat dairy each day.  A serving of nuts, seeds, or beans 5 times each week.  Heart-healthy fats. Healthy fats called Omega-3 fatty acids are found in foods such as flaxseeds and coldwater fish, like sardines, salmon, and mackerel.  Limit how much you eat of the following:  Canned or prepackaged foods.  Food that is high in trans fat, such as fried foods.  Food that is high in saturated fat, such as fatty meat.  Sweets, desserts, sugary drinks, and other foods with  added sugar.  Full-fat dairy products.  Do not salt foods before eating.  Try to eat at least 2 vegetarian meals each week.  Eat more home-cooked food and less restaurant, buffet, and fast food.  When eating at a restaurant, ask that your food be prepared with less salt or no salt, if possible. What foods are recommended? The items listed may not be a complete list. Talk with your dietitian about what dietary choices are best for you. Grains  Whole-grain or whole-wheat bread. Whole-grain or whole-wheat pasta. Brown rice. Orpah Cobb. Bulgur. Whole-grain and low-sodium cereals. Pita bread. Low-fat, low-sodium crackers. Whole-wheat flour tortillas. Vegetables  Fresh or frozen vegetables (raw, steamed, roasted, or grilled). Low-sodium or reduced-sodium tomato and vegetable juice. Low-sodium or reduced-sodium tomato sauce and tomato paste. Low-sodium or reduced-sodium canned vegetables. Fruits  All fresh, dried, or frozen fruit. Canned fruit in natural juice (without added sugar). Meat and other protein foods  Skinless chicken or [place]. Ground chicken or [place]. Pork with fat trimmed off. Fish and seafood. Egg whites. Dried beans, peas, or lentils. Unsalted nuts, nut butters, and seeds. Unsalted canned beans. Lean cuts of beef with fat trimmed off. Low-sodium, lean deli meat. Dairy  Low-fat (1%) or fat-free (skim) milk. Fat-free, low-fat, or reduced-fat cheeses. Nonfat, low-sodium ricotta or cottage cheese. Low-fat or nonfat yogurt. Low-fat, low-sodium cheese. Fats and oils  Soft margarine without trans fats. Vegetable oil. Low-fat, reduced-fat, or light mayonnaise and salad dressings (reduced-sodium). Canola, safflower, olive, soybean, and sunflower oils. Avocado. Seasoning and other foods  Herbs. Spices. Seasoning mixes without salt. Unsalted popcorn and  pretzels. Fat-free sweets. What foods are not recommended? The items listed may not be a complete list. Talk with your dietitian  about what dietary choices are best for you. Grains  Baked goods made with fat, such as croissants, muffins, or some breads. Dry pasta or rice meal packs. Vegetables  Creamed or fried vegetables. Vegetables in a cheese sauce. Regular canned vegetables (not low-sodium or reduced-sodium). Regular canned tomato sauce and paste (not low-sodium or reduced-sodium). Regular tomato and vegetable juice (not low-sodium or reduced-sodium). Rosita Fire. Olives. Fruits  Canned fruit in a light or heavy syrup. Fried fruit. Fruit in cream or butter sauce. Meat and other protein foods  Fatty cuts of meat. Ribs. Fried meat. Tomasa Blase. Sausage. Bologna and other processed lunch meats. Salami. Fatback. Hotdogs. Bratwurst. Salted nuts and seeds. Canned beans with added salt. Canned or smoked fish. Whole eggs or egg yolks. Chicken or [place] with skin. Dairy  Whole or 2% milk, cream, and half-and-half. Whole or full-fat cream cheese. Whole-fat or sweetened yogurt. Full-fat cheese. Nondairy creamers. Whipped toppings. Processed cheese and cheese spreads. Fats and oils  Butter. Stick margarine. Lard. Shortening. Ghee. Bacon fat. Tropical oils, such as coconut, palm kernel, or palm oil. Seasoning and other foods  Salted popcorn and pretzels. Onion salt, garlic salt, seasoned salt, table salt, and sea salt. Worcestershire sauce. Tartar sauce. Barbecue sauce. Teriyaki sauce. Soy sauce, including reduced-sodium. Steak sauce. Canned and packaged gravies. Fish sauce. Oyster sauce. Cocktail sauce. Horseradish that you find on the shelf. Ketchup. Mustard. Meat flavorings and tenderizers. Bouillon cubes. Hot sauce and Tabasco sauce. Premade or packaged marinades. Premade or packaged taco seasonings. Relishes. Regular salad dressings. Where to find more information:  National Heart, Lung, and Blood Institute: PopSteam.is  American Heart Association: www.heart.org Summary  The DASH eating plan is a healthy eating plan that has  been shown to reduce high blood pressure (hypertension). It may also reduce your risk for type 2 diabetes, heart disease, and stroke.  With the DASH eating plan, you should limit salt (sodium) intake to 2,300 mg a day. If you have hypertension, you may need to reduce your sodium intake to 1,500 mg a day.  When on the DASH eating plan, aim to eat more fresh fruits and vegetables, whole grains, lean proteins, low-fat dairy, and heart-healthy fats.  Work with your health care provider or diet and nutrition specialist (dietitian) to adjust your eating plan to your individual calorie needs. This information is not intended to replace advice given to you by your health care provider. Make sure you discuss any questions you have with your health care provider. Document Released: 08/02/2011 Document Revised: 08/06/2016 Document Reviewed: 08/06/2016 Elsevier Interactive Patient Education  [address]    Signed, Jacolyn Reedy, [place]  01/09/2017 2:37 PM    [facility] [address] [place], [place], [place]  [address] Phone: [phone]; Fax: [phone]

## 2017-01-09 NOTE — Patient Instructions (Signed)
Your physician recommends that you schedule a follow-up appointment in: 2 Weeks with Joni ReiningKathryn Lawrence, NP   Your physician has recommended you make the following change in your medication:   Increase Lisinopril to 40 mg Daily   If you need a refill on your cardiac medications before your next appointment, please call your pharmacy.  Thank you for choosing [facility]!    DASH Eating Plan DASH stands for "Dietary Approaches to Stop Hypertension." The DASH eating plan is a healthy eating plan that has been shown to reduce high blood pressure (hypertension). It may also reduce your risk for type 2 diabetes, heart disease, and stroke. The DASH eating plan may also help with weight loss. What are tips for following this plan? General guidelines   Avoid eating more than 2,300 mg (milligrams) of salt (sodium) a day. If you have hypertension, you may need to reduce your sodium intake to 1,500 mg a day.  Limit alcohol intake to no more than 1 drink a day for nonpregnant women and 2 drinks a day for men. One drink equals 12 oz of beer, 5 oz of wine, or 1 oz of hard liquor.  Work with your health care provider to maintain a healthy body weight or to lose weight. Ask what an ideal weight is for you.  Get at least 30 minutes of exercise that causes your heart to beat faster (aerobic exercise) most days of the week. Activities may include walking, swimming, or biking.  Work with your health care provider or diet and nutrition specialist (dietitian) to adjust your eating plan to your individual calorie needs. Reading food labels   Check food labels for the amount of sodium per serving. Choose foods with less than 5 percent of the Daily Value of sodium. Generally, foods with less than 300 mg of sodium per serving fit into this eating plan.  To find whole grains, look for the word "whole" as the first word in the ingredient list. Shopping   Buy products labeled as "low-sodium" or "no salt  added."  Buy fresh foods. Avoid canned foods and premade or frozen meals. Cooking   Avoid adding salt when cooking. Use salt-free seasonings or herbs instead of table salt or sea salt. Check with your health care provider or pharmacist before using salt substitutes.  Do not fry foods. Cook foods using healthy methods such as baking, boiling, grilling, and broiling instead.  Cook with heart-healthy oils, such as olive, canola, soybean, or sunflower oil. Meal planning    Eat a balanced diet that includes:  5 or more servings of fruits and vegetables each day. At each meal, try to fill half of your plate with fruits and vegetables.  Up to 6-8 servings of whole grains each day.  Less than 6 oz of lean meat, poultry, or fish each day. A 3-oz serving of meat is about the same size as a deck of cards. One egg equals 1 oz.  2 servings of low-fat dairy each day.  A serving of nuts, seeds, or beans 5 times each week.  Heart-healthy fats. Healthy fats called Omega-3 fatty acids are found in foods such as flaxseeds and coldwater fish, like sardines, salmon, and mackerel.  Limit how much you eat of the following:  Canned or prepackaged foods.  Food that is high in trans fat, such as fried foods.  Food that is high in saturated fat, such as fatty meat.  Sweets, desserts, sugary drinks, and other foods with added sugar.  Full-fat dairy products.  Do not salt foods before eating.  Try to eat at least 2 vegetarian meals each week.  Eat more home-cooked food and less restaurant, buffet, and fast food.  When eating at a restaurant, ask that your food be prepared with less salt or no salt, if possible. What foods are recommended? The items listed may not be a complete list. Talk with your dietitian about what dietary choices are best for you. Grains  Whole-grain or whole-wheat bread. Whole-grain or whole-wheat pasta. Brown rice. Orpah Cobb. Bulgur. Whole-grain and low-sodium  cereals. Pita bread. Low-fat, low-sodium crackers. Whole-wheat flour tortillas. Vegetables  Fresh or frozen vegetables (raw, steamed, roasted, or grilled). Low-sodium or reduced-sodium tomato and vegetable juice. Low-sodium or reduced-sodium tomato sauce and tomato paste. Low-sodium or reduced-sodium canned vegetables. Fruits  All fresh, dried, or frozen fruit. Canned fruit in natural juice (without added sugar). Meat and other protein foods  Skinless chicken or [place]. Ground chicken or [place]. Pork with fat trimmed off. Fish and seafood. Egg whites. Dried beans, peas, or lentils. Unsalted nuts, nut butters, and seeds. Unsalted canned beans. Lean cuts of beef with fat trimmed off. Low-sodium, lean deli meat. Dairy  Low-fat (1%) or fat-free (skim) milk. Fat-free, low-fat, or reduced-fat cheeses. Nonfat, low-sodium ricotta or cottage cheese. Low-fat or nonfat yogurt. Low-fat, low-sodium cheese. Fats and oils  Soft margarine without trans fats. Vegetable oil. Low-fat, reduced-fat, or light mayonnaise and salad dressings (reduced-sodium). Canola, safflower, olive, soybean, and sunflower oils. Avocado. Seasoning and other foods  Herbs. Spices. Seasoning mixes without salt. Unsalted popcorn and pretzels. Fat-free sweets. What foods are not recommended? The items listed may not be a complete list. Talk with your dietitian about what dietary choices are best for you. Grains  Baked goods made with fat, such as croissants, muffins, or some breads. Dry pasta or rice meal packs. Vegetables  Creamed or fried vegetables. Vegetables in a cheese sauce. Regular canned vegetables (not low-sodium or reduced-sodium). Regular canned tomato sauce and paste (not low-sodium or reduced-sodium). Regular tomato and vegetable juice (not low-sodium or reduced-sodium). Rosita Fire. Olives. Fruits  Canned fruit in a light or heavy syrup. Fried fruit. Fruit in cream or butter sauce. Meat and other protein foods  Fatty cuts of  meat. Ribs. Fried meat. Tomasa Blase. Sausage. Bologna and other processed lunch meats. Salami. Fatback. Hotdogs. Bratwurst. Salted nuts and seeds. Canned beans with added salt. Canned or smoked fish. Whole eggs or egg yolks. Chicken or [place] with skin. Dairy  Whole or 2% milk, cream, and half-and-half. Whole or full-fat cream cheese. Whole-fat or sweetened yogurt. Full-fat cheese. Nondairy creamers. Whipped toppings. Processed cheese and cheese spreads. Fats and oils  Butter. Stick margarine. Lard. Shortening. Ghee. Bacon fat. Tropical oils, such as coconut, palm kernel, or palm oil. Seasoning and other foods  Salted popcorn and pretzels. Onion salt, garlic salt, seasoned salt, table salt, and sea salt. Worcestershire sauce. Tartar sauce. Barbecue sauce. Teriyaki sauce. Soy sauce, including reduced-sodium. Steak sauce. Canned and packaged gravies. Fish sauce. Oyster sauce. Cocktail sauce. Horseradish that you find on the shelf. Ketchup. Mustard. Meat flavorings and tenderizers. Bouillon cubes. Hot sauce and Tabasco sauce. Premade or packaged marinades. Premade or packaged taco seasonings. Relishes. Regular salad dressings. Where to find more information:  National Heart, Lung, and Blood Institute: PopSteam.is  American Heart Association: www.heart.org Summary  The DASH eating plan is a healthy eating plan that has been shown to reduce high blood pressure (hypertension). It may also reduce your risk for  type 2 diabetes, heart disease, and stroke.  With the DASH eating plan, you should limit salt (sodium) intake to 2,300 mg a day. If you have hypertension, you may need to reduce your sodium intake to 1,500 mg a day.  When on the DASH eating plan, aim to eat more fresh fruits and vegetables, whole grains, lean proteins, low-fat dairy, and heart-healthy fats.  Work with your health care provider or diet and nutrition specialist (dietitian) to adjust your eating plan to your individual calorie  needs. This information is not intended to replace advice given to you by your health care provider. Make sure you discuss any questions you have with your health care provider. Document Released: 08/02/2011 Document Revised: 08/06/2016 Document Reviewed: 08/06/2016 Elsevier Interactive Patient Education  2017 [employer].

## 2017-01-30 DIAGNOSIS — G4700 Insomnia, unspecified: Secondary | ICD-10-CM | POA: Diagnosis not present

## 2017-01-31 NOTE — Progress Notes (Deleted)
Cardiology Office Note   Date:  01/31/2017   ID:  Bradley Hurley, DOB 14-Jul-1946, MRN [id number]  PCP:  Bradley Hurley, Steve, MD  Cardiologist: Bradley Hurley  No chief complaint on file.     History of Present Illness: Bradley Hurley is a 71 y.o. male who presents for ongoing assessment and management of CAD, with hx of stenting to LAD in 2003, DES to proximal LAD in 2013, hypertension, OSA non-compliant with CPAP, hyperlipidemia, and anxiety. He was found to be hypertensive on last office visit. Lisinopril was increased to 40 mg daily. He is here for follow up /response to treatment.     Past Medical History:  Diagnosis Date  . Anxiety   . CAD (coronary artery disease)   . Cholelithiasis   . Coronary atherosclerosis of native coronary artery 07/2002   Previous patient of [facility]; PCI  2003-90% septal perforator, 90% proximal and mid LAD->DES x2, normal CX, luminal irregularities in RCA, normal EF  . Depression   . Deviated septum   . Diverticulosis   . Erectile dysfunction    Low serum testosterone level  . Essential hypertension, benign   . Hemorrhoid   . Hyperlipidemia    Lipid profile in 03/2012:182, 782,95,621110,48,112  . MI (myocardial infarction) (HCC)    Anterior STEMI 8/13  . Obesity   . Obstructive sleep apnea    [facility] - 05/2011  . Tinnitus   . 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 APPENDECTOMY    . CARDIAC CATHETERIZATION  07/28/2012   20% distal left main, patent prox & mid LAD stents w/ 25% ISR of the latter (unchanged from 03/2012 cath, mid-LAD stent placed in 2003), 30-40% ostial LCx, 40-50% distal marginal, 30% distal serial RCA stenoses; LVEF 55% with apical HK  . COLONOSCOPY N/A 08/12/2013   Procedure: COLONOSCOPY;  Surgeon: Bradley Adeobert Bradley Rourk, MD;  Location: [facility];  Service: Endoscopy;  Laterality: N/A;  10:15  . CORONARY ANGIOPLASTY WITH STENT PLACEMENT  2003   mid LAD  .  CORONARY ANGIOPLASTY WITH STENT PLACEMENT  03/2012   STEMI s/p DES-prox LAD  . LEFT HEART CATHETERIZATION WITH CORONARY ANGIOGRAM N/A 04/10/2012   Procedure: LEFT HEART CATHETERIZATION WITH CORONARY ANGIOGRAM;  Surgeon: Bradley Hazelhristopher D McAlhany, MD;  [facility];  Service: Cardiovascular;  Laterality: N/A;  . LEFT HEART CATHETERIZATION WITH CORONARY ANGIOGRAM N/A 07/28/2012   Procedure: LEFT HEART CATHETERIZATION WITH CORONARY ANGIOGRAM;  Surgeon: Bradley Hazelhristopher D McAlhany, MD;  [facility];  Service: Cardiovascular;  Laterality: N/A;  . SPLENECTOMY    . TONSILLECTOMY       Current Outpatient Prescriptions  Medication Sig Dispense Refill  . atorvastatin (LIPITOR) 80 MG tablet Take 1 tablet (80 mg total) by mouth every evening. 30 tablet 3  . carvedilol (COREG) 6.25 MG tablet Take 1 tablet (6.25 mg total) by mouth 2 (two) times daily with a meal. 60 tablet 3  . EFFIENT 10 MG TABS tablet take 1 tablet by mouth once daily (Patient taking differently: take 5mg  tablet by mouth once daily) 30 tablet 11  . isosorbide mononitrate (IMDUR) 30 MG 24 hr tablet Take 1 tablet (30 mg total) by mouth daily. 30 tablet 11  . lisinopril (PRINIVIL,ZESTRIL) 40 MG tablet Take 1 tablet (40 mg total) by mouth daily. 90 tablet 3  . LORazepam (ATIVAN) 1 MG tablet Take 1 mg by mouth every 8 (  eight) hours. 1/2 to 1 Three Times Daily For Anxiety    . Multiple Vitamins-Minerals (MEGA MULTI MEN PO) Take 1 tablet by mouth daily as needed (energy).    . nitroGLYCERIN (NITROSTAT) 0.4 MG SL tablet Place 1 tablet (0.4 mg total) under the tongue every 5 (five) minutes as needed for chest pain. 50 tablet 6   No current facility-administered medications for this visit.     Allergies:   Aspirin    Social History:  The patient  reports that he quit smoking about 21 years ago. His smoking use included Cigarettes. He has a 15.00 pack-year smoking history. He quit smokeless tobacco use about 36 years ago. He reports  that he drinks about 0.6 oz of alcohol per week . He reports that he does not use drugs.   Family History:  The patient's family history includes Arrhythmia in his father; Cancer in his mother.    ROS: All other systems are reviewed and negative. Unless otherwise mentioned in H&P    PHYSICAL EXAM: VS:  There were no vitals taken for this visit. , BMI There is no height or weight on file to calculate BMI. GEN: Well nourished, well developed, in no acute distress  HEENT: normal  Neck: no JVD, carotid bruits, or masses Cardiac: ***RRR; no murmurs, rubs, or gallops,no edema  Respiratory:  clear to auscultation bilaterally, normal work of breathing GI: soft, nontender, nondistended, + BS MS: no deformity or atrophy  Skin: warm and dry, no rash Neuro:  Strength and sensation are intact Psych: euthymic mood, full affect   EKG:  EKG {ACTION; IS/IS [id number]} ordered today. The ekg ordered today demonstrates ***   Recent Labs: 12/26/2016: Creatinine, Ser 0.98 12/27/2016: Hemoglobin 12.4; Platelets 282    Lipid Panel    Component Value Date/Time   CHOL 151 12/26/2016 1114   TRIG 134 12/26/2016 1114   HDL 38 (L) 12/26/2016 1114   CHOLHDL 4.0 12/26/2016 1114   VLDL 27 12/26/2016 1114   LDLCALC 86 12/26/2016 1114      Wt Readings from Last 3 Encounters:  01/09/17 187 lb (84.8 kg)  12/27/16 187 lb 6.4 oz (85 kg)  03/02/16 190 lb (86.2 kg)      Other studies Reviewed Echocardiogram 02/09/2015 Left ventricle: The cavity size was normal. Wall thickness was   increased in a pattern of mild LVH. Systolic function was normal.   The estimated ejection fraction was in the range of 60% to 65%.   Wall motion was normal; there were no regional wall motion   abnormalities. Doppler parameters are consistent with abnormal   left ventricular relaxation (grade 1 diastolic dysfunction). - Aortic valve: Mildly calcified annulus. Trileaflet; mildly   thickened leaflets. - Mitral valve:  Mildly thickened leaflets . There was mild   regurgitation. - Left atrium: The atrium was mildly dilated. - Right atrium: Central venous pressure (est): 3 mm Hg. - Tricuspid valve: There was trivial regurgitation. - Pulmonary arteries: Systolic pressure could not be accurately   estimated. - Pericardium, extracardiac: There was no pericardial effusion.  NM Stress Test 12/27/2016  There was no ST segment deviation noted during stress.  This is a low risk study.  The left ventricular ejection fraction is mildly decreased (45-54%).   1. EF 52%, mild diffuse hypokinesis.  2. Primarily fixed medium-sized basal to mid inferior and basal to mid inferoseptal perfusion defect.  There does not appear to be significant ischemia.  Possible prior infarction versus diaphragmatic attenuation.  Given lack  of ischemia, overall low risk study.   ASSESSMENT AND PLAN:  1.  ***   Current medicines are reviewed at length with the patient today.    Labs/ tests ordered today include: *** Bettey Mare. Liborio Nixon, ANP, AACC   01/31/2017 4:35 PM    [facility] [address], [place], [place] [address] Phone: [phone]; Fax: [phone]

## 2017-02-01 ENCOUNTER — Ambulatory Visit: Payer: Medicare Other | Admitting: Adult Health

## 2017-02-01 ENCOUNTER — Encounter: Payer: Self-pay | Admitting: Adult Health

## 2017-05-14 ENCOUNTER — Other Ambulatory Visit: Payer: Self-pay | Admitting: Physician Assistant

## 2017-05-14 ENCOUNTER — Other Ambulatory Visit: Payer: Self-pay | Admitting: Cardiovascular Disease

## 2017-05-14 NOTE — Telephone Encounter (Signed)
This is a Manuel Garcia pt.  °

## 2017-06-18 ENCOUNTER — Other Ambulatory Visit: Payer: Self-pay | Admitting: Physician Assistant

## 2017-07-25 ENCOUNTER — Other Ambulatory Visit: Payer: Self-pay

## 2017-07-25 MED ORDER — ATORVASTATIN CALCIUM 80 MG PO TABS: 80.0000 mg | ORAL_TABLET | Freq: Every evening | ORAL | 0 refills | Status: DC

## 2017-08-19 ENCOUNTER — Other Ambulatory Visit: Payer: Self-pay | Admitting: *Deleted

## 2017-08-19 MED ORDER — ATORVASTATIN CALCIUM 80 MG PO TABS: 80.0000 mg | ORAL_TABLET | Freq: Every evening | ORAL | 0 refills | Status: DC

## 2017-10-02 ENCOUNTER — Other Ambulatory Visit: Payer: Self-pay | Admitting: *Deleted

## 2017-10-02 MED ORDER — CARVEDILOL 6.25 MG PO TABS: 6.2500 mg | ORAL_TABLET | Freq: Two times a day (BID) | ORAL | 0 refills | Status: DC

## 2017-10-03 ENCOUNTER — Other Ambulatory Visit: Payer: Self-pay | Admitting: Cardiology

## 2017-10-03 MED ORDER — ATORVASTATIN CALCIUM 80 MG PO TABS: 80.0000 mg | ORAL_TABLET | Freq: Every evening | ORAL | 1 refills | Status: DC

## 2017-10-03 NOTE — Telephone Encounter (Signed)
Refill complete 

## 2017-10-03 NOTE — Telephone Encounter (Signed)
°*  STAT* If patient is at the pharmacy, call can be transferred to refill team.   1. Which medications need to be refilled? (please list name of each medication and dose if known)  atorvastatin (LIPITOR) 80 MG tablet [[id number]][[id number]]    2. Which pharmacy/location (including street and city if local pharmacy) is medication to be sent to? [facility]  3. Do they need a 30 day or 90 day supply?  90 day

## 2017-12-24 ENCOUNTER — Encounter: Payer: Self-pay | Admitting: Cardiology

## 2017-12-24 NOTE — Progress Notes (Signed)
Cardiology Office Note  Date: 12/25/2017   ID: Konnor, Vondrasek 1945-08-28, MRN [id number]  PCP: Gareth Morgan, MD  Evaluating Cardiologist: Nona Dell, MD   Chief Complaint  Patient presents with  . Coronary Artery Disease    History of Present Illness: Bradley Hurley is a 72 y.o. male last seen by Ms. Lilla Shook in May 2018.  He is actually a patient of Dr. Tenny Craw and more recently Dr. Purvis Sheffield that I have not seen in clinic previously.  I reviewed extensive records and updated his chart.  He presents for a routine follow-up visit.  States that he has been doing well, no angina or nitroglycerin use.  He enjoys playing golf.  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  Previous cardiac catheterization results from 2013 are outlined below.  He also underwent a follow-up Lexiscan Myoview during hospitalization in May 2018 that was low risk.  I went over his current medications.  He has an aspirin allergy and is therefore on long-term Effient.  Also on high-dose Lipitor which he tolerates.  He has lipids follow-up in the [place] system and also Dr. Sudie Bailey.  I personally reviewed his ECG today which shows sinus rhythm with IVCD.  Past Medical History:  Diagnosis Date  . Anxiety   . CAD (coronary artery disease)    DES to prox-mid LAD 2013  . Cholelithiasis   . Depression   . Deviated septum   . Diverticulosis   . Erectile dysfunction    Low serum testosterone level  . Essential hypertension   . Hemorrhoid   . Hyperlipidemia   . MI (myocardial infarction) (HCC)    Anterior STEMI 8/13  . Obesity   . Obstructive sleep apnea   . Tinnitus     Past Surgical History:  Procedure Laterality Date  . APPENDECTOMY    . CARDIAC CATHETERIZATION  07/28/2012   20% distal left main, patent prox & mid LAD stents w/ 25% ISR of the latter (unchanged from 03/2012 cath, mid-LAD stent placed in 2003), 30-40% ostial LCx, 40-50% distal marginal, 30% distal serial RCA  stenoses; LVEF 55% with apical HK  . COLONOSCOPY N/A 08/12/2013   Procedure: COLONOSCOPY;  Surgeon: Corbin Ade, MD;  [facility];  Service: Endoscopy;  Laterality: N/A;  10:15  . CORONARY ANGIOPLASTY WITH STENT PLACEMENT  2003   mid LAD  . CORONARY ANGIOPLASTY WITH STENT PLACEMENT  03/2012   STEMI s/p DES-prox LAD  . LEFT HEART CATHETERIZATION WITH CORONARY ANGIOGRAM N/A 04/10/2012   Procedure: LEFT HEART CATHETERIZATION WITH CORONARY ANGIOGRAM;  Surgeon: Kathleene Hazel, MD;  Location: [facility] LAB;  Service: Cardiovascular;  Laterality: N/A;  . LEFT HEART CATHETERIZATION WITH CORONARY ANGIOGRAM N/A 07/28/2012   Procedure: LEFT HEART CATHETERIZATION WITH CORONARY ANGIOGRAM;  Surgeon: Kathleene Hazel, MD;  Location: [facility] CATH LAB;  Service: Cardiovascular;  Laterality: N/A;  . SPLENECTOMY    . TONSILLECTOMY      Current Outpatient Medications  Medication Sig Dispense Refill  . atorvastatin (LIPITOR) 80 MG tablet Take 1 tablet (80 mg total) by mouth every evening. 90 tablet 1  . carvedilol (COREG) 6.25 MG tablet Take 1 tablet (6.25 mg total) by mouth 2 (two) times daily with a meal. 60 tablet 0  . hydrochlorothiazide (HYDRODIURIL) 25 MG tablet Take 25 mg by mouth daily.    . isosorbide mononitrate (IMDUR) 30 MG 24 hr tablet Take 1 tablet (30 mg total) by mouth daily. 30  tablet 11  . lisinopril (PRINIVIL,ZESTRIL) 40 MG tablet Take 1 tablet (40 mg total) by mouth daily. 90 tablet 3  . LORazepam (ATIVAN) 1 MG tablet Take 1 mg by mouth every 8 (eight) hours. 1/2 to 1 Three Times Daily For Anxiety    . Multiple Vitamins-Minerals (MEGA MULTI MEN PO) Take 1 tablet by mouth daily as needed (energy).    . nitroGLYCERIN (NITROSTAT) 0.4 MG SL tablet Place 1 tablet (0.4 mg total) under the tongue every 5 (five) minutes as needed for chest pain. 50 tablet 6  . prasugrel (EFFIENT) 10 MG TABS tablet take 1 tablet once daily 30 tablet 11   No current facility-administered  medications for this visit.    Allergies:  Aspirin   Social History: The patient  reports that he quit smoking about 22 years ago. His smoking use included cigarettes. He has a 15.00 pack-year smoking history. He quit smokeless tobacco use about 36 years ago. He reports that he drinks about 0.6 oz of alcohol per week. He reports that he does not use drugs.   ROS:  Please see the history of present illness. Otherwise, complete review of systems is positive for none.  All other systems are reviewed and negative.   Physical Exam: VS:  BP 138/84   Pulse 71   Ht  (1.651 m)   Wt 178 lb (80.7 kg)   SpO2 98%   BMI 29.62 kg/m , BMI Body mass index is 29.62 kg/m.  Wt Readings from Last 3 Encounters:  12/25/17 178 lb (80.7 kg)  01/09/17 187 lb (84.8 kg)  12/27/16 187 lb 6.4 oz (85 kg)    General: Patient appears comfortable at rest. HEENT: Conjunctiva and lids normal, oropharynx clear. Neck: Supple, no elevated JVP or carotid bruits, no thyromegaly. Lungs: Clear to auscultation, nonlabored breathing at rest. Cardiac: Regular rate and rhythm, no S3, soft systolic murmur, no pericardial rub. Abdomen: Soft, nontender, bowel sounds present. Extremities: No pitting edema, distal pulses 2+. Skin: Warm and dry. Musculoskeletal: No kyphosis. Neuropsychiatric: Alert and oriented x3, affect grossly appropriate.  ECG: I personally reviewed the tracing from 12/26/2016 which showed sinus rhythm with left atrial enlargement and old anteroseptal infarct pattern.  Recent Labwork: 12/26/2016: Creatinine, Ser 0.98 12/27/2016: Hemoglobin 12.4; Platelets 282     Component Value Date/Time   CHOL 151 12/26/2016 1114   TRIG 134 12/26/2016 1114   HDL 38 (L) 12/26/2016 1114   CHOLHDL 4.0 12/26/2016 1114   VLDL 27 12/26/2016 1114   LDLCALC 86 12/26/2016 1114    Other Studies Reviewed Today:  Echocardiogram 02/09/2015: Study Conclusions  - Left ventricle: The cavity size was normal. Wall thickness  was   increased in a pattern of mild LVH. Systolic function was normal.   The estimated ejection fraction was in the range of 60% to 65%.   Wall motion was normal; there were no regional wall motion   abnormalities. Doppler parameters are consistent with abnormal   left ventricular relaxation (grade 1 diastolic dysfunction). - Aortic valve: Mildly calcified annulus. Trileaflet; mildly   thickened leaflets. - Mitral valve: Mildly thickened leaflets . There was mild   regurgitation. - Left atrium: The atrium was mildly dilated. - Right atrium: Central venous pressure (est): 3 mm Hg. - Tricuspid valve: There was trivial regurgitation. - Pulmonary arteries: Systolic pressure could not be accurately   estimated. - Pericardium, extracardiac: There was no pericardial effusion.  Impressions:  - Mild LVH with LVEF 60-65%, grade 1 diastolic dysfunction.  Mild   left atrial enlargement. Mildly thickened mitral leaflets with   mild mitral regurgitation. Mildly sclerotic aortic valve. Unable   to assess PASP.  Cardiac catheterization 07/28/2012: Left main:  Distal 20% stenosis.   Left Anterior Descending Artery: Large caliber vessel that courses to the apex and becomes smaller in caliber in the distal segment. There are patent stents in the proximal and mid vessel with no restenosis noted in the proximal segment. The stented segment in the mid vessel has 25% narrowing consistent with restenosis (of note, this is the area of stenting from 2003 and is unchanged in appearance from last cath in August 2013). The distal vessel is small in caliber and has serial 40% stenoses. The diagonal branch is moderate in caliber and has minimal plaque disease.   Circumflex Artery: Large caliber vessel that terminates into a marginal branch. The ostium of the vessel appears to have 30-40% stenosis. This does not appear to be flow limiting. There is a 40-50% stenosis in the mid portion of the marginal branch.    Right Coronary Artery: Large dominant vessel with luminal irregularities in the proximal and mid segment. The distal vessel has serial 30% stenoses. The PDA is a moderate caliber vessel with mild diffuse plaque. The PLA is a moderate sized vessel with mild diffuse plaque.   Left Ventricular Angiogram: LVEF=55%. Hypokinesis of the apex.   Aortic root without evidence of dilatation or dissection.   Impression: 1. Double vessel CAD with patent stents in the proximal and mid LAD with diffuse small vessel disease in LAD and Circumflex 2. Preserved LV systolic function  Lexiscan Myoview 12/27/2016:  There was no ST segment deviation noted during stress.  This is a low risk study.  The left ventricular ejection fraction is mildly decreased (45-54%).   1. EF 52%, mild diffuse hypokinesis.  2. Primarily fixed medium-sized basal to mid inferior and basal to mid inferoseptal perfusion defect.  There does not appear to be significant ischemia.  Possible prior infarction versus diaphragmatic attenuation.   Given lack of ischemia, overall low risk study.   Assessment and Plan:  1.  CAD with history of anterior STEMI and DES to the proximal to mid LAD in 2013.  He is doing well at this point on medical therapy without anginal symptoms and underwent a follow-up Myoview last year which demonstrated no active ischemia.  He has an aspirin allergy and continues on Effient long-term.  2.  Mixed hyperlipidemia, continues on high-dose Lipitor.  Lipids are followed at the [facility] hospital system and Dr. Sudie Bailey.  3.  Hypertension, blood pressure control is adequate today.  He is on lisinopril, HCTZ and Coreg.  4.  OSA.  Current medicines were reviewed with the patient today.   Orders Placed This Encounter  Procedures  . EKG 12-Lead    Disposition: Follow-up in 1 year.  Signed, Jonelle Sidle, MD, [facility] 12/25/2017 10:17 AM    [facility] at [facility] [address] [place], [place] [address] Phone: [phone]; Fax: [phone]

## 2017-12-25 ENCOUNTER — Ambulatory Visit (INDEPENDENT_AMBULATORY_CARE_PROVIDER_SITE_OTHER): Payer: Medicare Other | Admitting: Cardiology

## 2017-12-25 ENCOUNTER — Encounter: Payer: Self-pay | Admitting: Cardiology

## 2017-12-25 VITALS — BP 138/84 | HR 71 | Ht 65.0 in | Wt 178.0 lb

## 2017-12-25 DIAGNOSIS — I25119 Atherosclerotic heart disease of native coronary artery with unspecified angina pectoris: Secondary | ICD-10-CM

## 2017-12-25 DIAGNOSIS — G4733 Obstructive sleep apnea (adult) (pediatric): Secondary | ICD-10-CM

## 2017-12-25 DIAGNOSIS — I10 Essential (primary) hypertension: Secondary | ICD-10-CM | POA: Diagnosis not present

## 2017-12-25 DIAGNOSIS — E782 Mixed hyperlipidemia: Secondary | ICD-10-CM | POA: Diagnosis not present

## 2017-12-25 NOTE — Patient Instructions (Signed)

## 2018-01-14 ENCOUNTER — Other Ambulatory Visit: Payer: Self-pay | Admitting: Cardiovascular Disease

## 2018-04-22 ENCOUNTER — Other Ambulatory Visit: Payer: Self-pay | Admitting: Cardiovascular Disease

## 2018-06-23 ENCOUNTER — Other Ambulatory Visit: Payer: Self-pay | Admitting: Cardiovascular Disease

## 2018-06-23 ENCOUNTER — Other Ambulatory Visit: Payer: Self-pay | Admitting: Cardiology

## 2018-10-21 DIAGNOSIS — A499 Bacterial infection, unspecified: Secondary | ICD-10-CM | POA: Diagnosis not present

## 2018-11-18 DIAGNOSIS — K0500 Acute gingivitis, plaque induced: Secondary | ICD-10-CM | POA: Diagnosis not present

## 2018-11-18 DIAGNOSIS — J0101 Acute recurrent maxillary sinusitis: Secondary | ICD-10-CM | POA: Diagnosis not present

## 2018-11-27 ENCOUNTER — Other Ambulatory Visit: Payer: Self-pay | Admitting: Cardiovascular Disease

## 2019-01-06 ENCOUNTER — Telehealth: Payer: Self-pay | Admitting: Cardiology

## 2019-01-06 NOTE — Telephone Encounter (Signed)
Virtual Visit Pre-Appointment Phone Call  "(Name), I am calling you today to discuss your upcoming appointment. We are currently trying to limit exposure to the virus that causes COVID-19 by seeing patients at home rather than in the office."  1. "What is the BEST phone number to call the day of the visit?" - include this in appointment notes  2. Do you have or have access to (through a family member/friend) a smartphone with video capability that we can use for your visit?" a. If yes - list this number in appt notes as cell (if different from BEST phone #) and list the appointment type as a VIDEO visit in appointment notes b. If no - list the appointment type as a PHONE visit in appointment notes  3. Confirm consent - "In the setting of the current Covid19 crisis, you are scheduled for a (phone or video) visit with your provider on (date) at (time).  Just as we do with many in-office visits, in order for you to participate in this visit, we must obtain consent.  If you'd like, I can send this to your mychart (if signed up) or email for you to review.  Otherwise, I can obtain your verbal consent now.  All virtual visits are billed to your insurance company just like a normal visit would be.  By agreeing to a virtual visit, we'd like you to understand that the technology does not allow for your provider to perform an examination, and thus may limit your provider's ability to fully assess your condition. If your provider identifies any concerns that need to be evaluated in person, we will make arrangements to do so.  Finally, though the technology is pretty good, we cannot assure that it will always work on either your or our end, and in the setting of a video visit, we may have to convert it to a phone-only visit.  In either situation, we cannot ensure that we have a secure connection.  Are you willing to proceed?" STAFF: Did the patient verbally acknowledge consent to telehealth visit? Document  YES/NO here: Yes  4. Advise patient to be prepared - "Two hours prior to your appointment, go ahead and check your blood pressure, pulse, oxygen saturation, and your weight (if you have the equipment to check those) and write them all down. When your visit starts, your provider will ask you for this information. If you have an Apple Watch or Kardia device, please plan to have heart rate information ready on the day of your appointment. Please have a pen and paper handy nearby the day of the visit as well."  5. Give patient instructions for MyChart download to smartphone OR [email or web address] as below if video visit (depending on what platform provider is using)  6. Inform patient they will receive a phone call 15 minutes prior to their appointment time (may be from unknown caller ID) so they should be prepared to answer    TELEPHONE CALL NOTE  Bradley Hurley has been deemed a candidate for a follow-up tele-health visit to limit community exposure during the Covid-19 pandemic. I spoke with the patient via phone to ensure availability of phone/video source, confirm preferred email & phone number, and discuss instructions and expectations.  I reminded Bradley Hurley to be prepared with any vital sign and/or heart rhythm information that could potentially be obtained via home monitoring, at the time of his visit. I reminded Bradley Hurley to expect a phone call prior to  his visit.  Terry L Goins 01/06/2019 11:12 AM

## 2019-01-07 NOTE — Progress Notes (Deleted)
{Choose 1 Note Type (Telehealth Visit or Telephone Visit):[phone]}   Date:  01/07/2019   ID:  Bradley Hurley, DOB [birth year], MRN [id number]  {Patient [phone]::"Home"} {Provider Location:[phone]::"Home"}  PCP:  Lemmie Evens, MD  Cardiologist:  Rozann Lesches, MD  Evaluation Performed:  {Choose Visit MEQA:[id number]::"[id number] Visit"}  Chief Complaint:  ***  History of Present Illness:    Bradley Hurley is a 73 y.o. male that I met in May 2019.  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  The patient {does/does not:200015} have symptoms concerning for COVID-19 infection (fever, chills, cough, or new shortness of breath).    Past Medical History:  Diagnosis Date  . Anxiety   . CAD (coronary artery disease)    DES to prox-mid LAD 2013  . Cholelithiasis   . Depression   . Deviated septum   . Diverticulosis   . Erectile dysfunction    Low serum testosterone level  . Essential hypertension   . Hemorrhoid   . Hyperlipidemia   . MI (myocardial infarction) ([place])    Anterior STEMI 8/13  . Obesity   . Obstructive sleep apnea   . Tinnitus    Past Surgical History:  Procedure Laterality Date  . APPENDECTOMY    . CARDIAC CATHETERIZATION  07/28/2012   20% distal left main, patent prox & mid LAD stents w/ 25% ISR of the latter (unchanged from 03/2012 cath, mid-LAD stent placed in 2003), 30-40% ostial LCx, 40-50% distal marginal, 30% distal serial RCA stenoses; LVEF 55% with apical HK  . COLONOSCOPY N/A 08/12/2013   Procedure: COLONOSCOPY;  Surgeon: Daneil Dolin, MD;  [facility];  Service: Endoscopy;  Laterality: N/A;  10:15  . CORONARY ANGIOPLASTY WITH STENT PLACEMENT  2003   mid LAD  . CORONARY ANGIOPLASTY WITH STENT PLACEMENT  03/2012   STEMI s/p DES-prox LAD  . LEFT HEART CATHETERIZATION WITH CORONARY ANGIOGRAM N/A 04/10/2012   Procedure: LEFT HEART CATHETERIZATION WITH CORONARY ANGIOGRAM;  Surgeon: Burnell Blanks, MD;  Location: [facility] CATH LAB;  Service: Cardiovascular;  Laterality: N/A;  . LEFT HEART CATHETERIZATION WITH CORONARY ANGIOGRAM N/A 07/28/2012   Procedure: LEFT HEART CATHETERIZATION WITH CORONARY ANGIOGRAM;  Surgeon: Burnell Blanks, MD;  Location: [facility] C[facility] LAB;  Service: Cardiovascular;  Laterality: N/A;  . SPLENECTOMY    . TONSILLECTOMY       No outpatient medications have been marked as taking for the 01/08/19 encounter (Appointment) with Satira Sark, MD.     Allergies:   Aspirin   Social History   Tobacco Use  . Smoking status: Former Smoker    Packs/day: 1.00    Years: 15.00    Pack years: 15.00    Types: Cigarettes    Last attempt to quit: 09/03/1995    Years since quitting: 23.3  . Smokeless tobacco: Former Systems developer    Quit date: 01/30/1981  . Tobacco comment: Quit in 1982  Substance Use Topics  . Alcohol use: Yes    Alcohol/week: 1.0 standard drinks    Types: 1 Standard drinks or equivalent per week    Comment: occ  . Drug use: No     Family Hx: The patient's family history includes Arrhythmia in his father; Cancer in his mother.  ROS:   Please see the history of present illness.    *** All other systems reviewed and are negative.   Prior CV studies:   The following studies were reviewed today:  Echocardiogram 02/09/2015: Study Conclusions  -  Left ventricle: The cavity size was normal. Wall thickness was increased in a pattern of mild LVH. Systolic function was normal. The estimated ejection fraction was in the range of 60% to 65%. Wall motion was normal; there were no regional wall motion abnormalities. Doppler parameters are consistent with abnormal left ventricular relaxation (grade 1 diastolic dysfunction). - Aortic valve: Mildly calcified annulus. Trileaflet; mildly thickened leaflets. - Mitral valve: Mildly thickened leaflets . There was mild regurgitation. - Left atrium: The atrium was mildly dilated. - Right atrium:  Central venous pressure (est): 3 mm Hg. - Tricuspid valve: There was trivial regurgitation. - Pulmonary arteries: Systolic pressure could not be accurately estimated. - Pericardium, extracardiac: There was no pericardial effusion.  Impressions:  - Mild LVH with LVEF 17-49%, grade 1 diastolic dysfunction. Mild left atrial enlargement. Mildly thickened mitral leaflets with mild mitral regurgitation. Mildly sclerotic aortic valve. Unable to assess PASP.  Cardiac catheterization 07/28/2012: Left main: Distal 20% stenosis.   Left Anterior Descending Artery: Large caliber vessel that courses to the apex and becomes smaller in caliber in the distal segment. There are patent stents in the proximal and mid vessel with no restenosis noted in the proximal segment. The stented segment in the mid vessel has 25% narrowing consistent with restenosis (of note, this is the area of stenting from 2003 and is unchanged in appearance from last cath in August 2013). The distal vessel is small in caliber and has serial 40% stenoses. The diagonal branch is moderate in caliber and has minimal plaque disease.   Circumflex Artery: Large caliber vessel that terminates into a marginal branch. The ostium of the vessel appears to have 30-40% stenosis. This does not appear to be flow limiting. There is a 40-50% stenosis in the mid portion of the marginal branch.  Right Coronary Artery: Large dominant vessel with luminal irregularities in the proximal and mid segment. The distal vessel has serial 30% stenoses. The PDA is a moderate caliber vessel with mild diffuse plaque. The PLA is a moderate sized vessel with mild diffuse plaque.   Left Ventricular Angiogram: LVEF=55%. Hypokinesis of the apex.   Aortic rootwithout evidence of dilatation or dissection.   Impression: 1. Double vessel CAD with patent stents in the proximal and mid LAD with diffuse small vessel disease in LAD and Circumflex 2. Preserved  LV systolic function  Lexiscan Myoview 12/27/2016:  There was no ST segment deviation noted during stress.  This is a low risk study.  The left ventricular ejection fraction is mildly decreased (45-54%).  1. EF 52%, mild diffuse hypokinesis.  2. Primarily fixed medium-sized basal to mid inferior and basal to mid inferoseptal perfusion defect. There does not appear to be significant ischemia. Possible prior infarction versus diaphragmatic attenuation.   Given lack of ischemia, overall low risk study.   Labs/Other Tests and Data Reviewed:    EKG:  An ECG dated 12/25/2017 was personally reviewed today and demonstrated:  Sinus rhythm with IVCD.  Recent Labs:    Recent Lipid Panel Lab Results  Component Value Date/Time   CHOL 151 12/26/2016 11:14 AM   TRIG 134 12/26/2016 11:14 AM   HDL 38 (L) 12/26/2016 11:14 AM   CHOLHDL 4.0 12/26/2016 11:14 AM   LDLCALC 86 12/26/2016 11:14 AM    Wt Readings from Last 3 Encounters:  12/25/17 178 lb (80.7 kg)  01/09/17 187 lb (84.8 kg)  12/27/16 187 lb 6.4 oz (85 kg)     Objective:    Vital Signs:  There were  no vitals taken for this visit.   {HeartCare Virtual Exam (Optional):[phone]::"VITAL SIGNS:  reviewed"}  ASSESSMENT & PLAN:    1. ***  COVID-19 Education: The signs and symptoms of COVID-19 were discussed with the patient and how to seek care for testing (follow up with PCP or arrange E-visit).  ***The importance of social distancing was discussed today.  Time:   Today, I have spent *** minutes with the patient with telehealth technology discussing the above problems.     Medication Adjustments/Labs and Tests Ordered: Current medicines are reviewed at length with the patient today.  Concerns regarding medicines are outlined above.   Tests Ordered: No orders of the defined types were placed in this encounter.   Medication Changes: No orders of the defined types were placed in this encounter.   Disposition:  Follow  up {follow up:15908}  Signed, Rozann Lesches, MD  01/07/2019 12:51 PM     [facility]

## 2019-01-08 ENCOUNTER — Telehealth: Payer: Medicare Other | Admitting: Cardiology

## 2019-01-19 NOTE — Progress Notes (Signed)
Virtual Visit via Telephone Note   This visit type was conducted due to national recommendations for restrictions regarding the COVID-19 Pandemic (e.g. social distancing) in an effort to limit this patient's exposure and mitigate transmission in our community.  Due to his co-morbid illnesses, this patient is at least at moderate risk for complications without adequate follow up.  This format is felt to be most appropriate for this patient at this time.  The patient did not have access to video technology/had technical difficulties with video requiring transitioning to audio format only (telephone).  All issues noted in this document were discussed and addressed.  No physical exam could be performed with this format.  Please refer to the patient's chart for his  consent to telehealth for [facility].   Date:  01/20/2019   ID:  Bradley Hurley, DOB [birth year], MRN [id number]  Patient Location: Home Provider Location: Home  PCP:  Gareth Morgan, MD  Cardiologist:  Nona Dell, MD  Evaluation Performed:  Follow-Up Visit  Chief Complaint:   Cardiac follow-up  History of Present Illness:    Bradley Hurley is a 73 y.o. male last seen in May 2019.  He did not have video access and we spoke by phone today.  Cardiac perspective he does not report any progressive angina symptoms or increasing nitroglycerin use.  He has not been very active, we discussed a walking plan.  He also tells me that his wife had a heart attack he has been keeping an eye on her.  We discussed interval lab work.  He states that he is due to see his [place] provider in [place].  Blood pressure was elevated today, he ran out of lisinopril last week.  We are providing a prescription.  We also.  He reports compliance.  The patient does not have symptoms concerning for COVID-19 infection (fever, chills, cough, or new shortness of breath).    Past Medical History:  Diagnosis Date  . Anxiety   . CAD (coronary artery  disease)    DES to prox-mid LAD 2013  . Cholelithiasis   . Depression   . Deviated septum   . Diverticulosis   . Erectile dysfunction    Low serum testosterone level  . Essential hypertension   . Hemorrhoid   . Hyperlipidemia   . MI (myocardial infarction) (HCC)    Anterior STEMI 8/13  . Obesity   . Obstructive sleep apnea   . Tinnitus    Past Surgical History:  Procedure Laterality Date  . APPENDECTOMY    . CARDIAC CATHETERIZATION  07/28/2012   20% distal left main, patent prox & mid LAD stents w/ 25% ISR of the latter (unchanged from 03/2012 cath, mid-LAD stent placed in 2003), 30-40% ostial LCx, 40-50% distal marginal, 30% distal serial RCA stenoses; LVEF 55% with apical HK  . COLONOSCOPY N/A 08/12/2013   Procedure: COLONOSCOPY;  Surgeon: Corbin Ade, MD;  Location: [facility] END[facility] SUITE;  Service: Endoscopy;  Laterality: N/A;  10:15  . CORONARY ANGIOPLASTY WITH STENT PLACEMENT  2003   mid LAD  . CORONARY ANGIOPLASTY WITH STENT PLACEMENT  03/2012   STEMI s/p DES-prox LAD  . LEFT HEART CATHETERIZATION WITH CORONARY ANGIOGRAM N/A 04/10/2012   Procedure: LEFT HEART CATHETERIZATION WITH CORONARY ANGIOGRAM;  Surgeon: Kathleene Hazel, MD;  Location: [facility] CATH LAB;  Service: Cardiovascular;  Laterality: N/A;  . LEFT HEART CATHETERIZATION WITH CORONARY ANGIOGRAM N/A 07/28/2012   Procedure: LEFT HEART CATHETERIZATION WITH CORONARY ANGIOGRAM;  Surgeon: Nile Dear  Clifton James, MD;  Location: [facility] CATH LAB;  Service: Cardiovascular;  Laterality: N/A;  . SPLENECTOMY    . TONSILLECTOMY       Current Meds  Medication Sig  . atorvastatin (LIPITOR) 80 MG tablet TAKE 1 TABLET BY MOUTH EVERY EVENING  . carvedilol (COREG) 6.25 MG tablet TAKE 1 TABLET TWICE A DAY WITH FOOD  . hydrochlorothiazide (HYDRODIURIL) 25 MG tablet Take 1 tablet (25 mg total) by mouth daily.  . isosorbide mononitrate (IMDUR) 30 MG 24 hr tablet TAKE 1 TABLET BY MOUTH ONCE DAILY  . lisinopril (ZESTRIL) 40 MG tablet Take 1  tablet (40 mg total) by mouth daily.  Marland Kitchen LORazepam (ATIVAN) 1 MG tablet Take 1 mg by mouth every 8 (eight) hours. 1/2 to 1 Three Times Daily For Anxiety  . Multiple Vitamins-Minerals (MEGA MULTI MEN PO) Take 1 tablet by mouth daily as needed (energy).  . nitroGLYCERIN (NITROSTAT) 0.4 MG SL tablet Place 1 tablet (0.4 mg total) under the tongue every 5 (five) minutes as needed for chest pain.  . prasugrel (EFFIENT) 10 MG TABS tablet TAKE 1 TABLET BY MOUTH ONCE DAILY  . [DISCONTINUED] hydrochlorothiazide (HYDRODIURIL) 25 MG tablet Take 25 mg by mouth daily.  . [DISCONTINUED] lisinopril (PRINIVIL,ZESTRIL) 40 MG tablet Take 1 tablet (40 mg total) by mouth daily.     Allergies:   Aspirin   Social History   Tobacco Use  . Smoking status: Former Smoker    Packs/day: 1.00    Years: 15.00    Pack years: 15.00    Types: Cigarettes    Last attempt to quit: 09/03/1995    Years since quitting: 23.3  . Smokeless tobacco: Former Neurosurgeon    Quit date: 01/30/1981  . Tobacco comment: Quit in 1982  Substance Use Topics  . Alcohol use: Yes    Alcohol/week: 1.0 standard drinks    Types: 1 Standard drinks or equivalent per week    Comment: occ  . Drug use: No     Family Hx: The patient's family history includes Arrhythmia in his father; Cancer in his mother.  ROS:   Please see the history of present illness.    All other systems reviewed and are negative.   Prior CV studies:   The following studies were reviewed today:  Echocardiogram 02/09/2015: Study Conclusions  - Left ventricle: The cavity size was normal. Wall thickness was increased in a pattern of mild LVH. Systolic function was normal. The estimated ejection fraction was in the range of 60% to 65%. Wall motion was normal; there were no regional wall motion abnormalities. Doppler parameters are consistent with abnormal left ventricular relaxation (grade 1 diastolic dysfunction). - Aortic valve: Mildly calcified annulus.  Trileaflet; mildly thickened leaflets. - Mitral valve: Mildly thickened leaflets . There was mild regurgitation. - Left atrium: The atrium was mildly dilated. - Right atrium: Central venous pressure (est): 3 mm Hg. - Tricuspid valve: There was trivial regurgitation. - Pulmonary arteries: Systolic pressure could not be accurately estimated. - Pericardium, extracardiac: There was no pericardial effusion.  Impressions:  - Mild LVH with LVEF 60-65%, grade 1 diastolic dysfunction. Mild left atrial enlargement. Mildly thickened mitral leaflets with mild mitral regurgitation. Mildly sclerotic aortic valve. Unable to assess PASP.  Cardiac catheterization 07/28/2012: Left main: Distal 20% stenosis.   Left Anterior Descending Artery: Large caliber vessel that courses to the apex and becomes smaller in caliber in the distal segment. There are patent stents in the proximal and mid vessel with no restenosis noted in  the proximal segment. The stented segment in the mid vessel has 25% narrowing consistent with restenosis (of note, this is the area of stenting from 2003 and is unchanged in appearance from last cath in August 2013). The distal vessel is small in caliber and has serial 40% stenoses. The diagonal branch is moderate in caliber and has minimal plaque disease.   Circumflex Artery: Large caliber vessel that terminates into a marginal branch. The ostium of the vessel appears to have 30-40% stenosis. This does not appear to be flow limiting. There is a 40-50% stenosis in the mid portion of the marginal branch.  Right Coronary Artery: Large dominant vessel with luminal irregularities in the proximal and mid segment. The distal vessel has serial 30% stenoses. The PDA is a moderate caliber vessel with mild diffuse plaque. The PLA is a moderate sized vessel with mild diffuse plaque.   Left Ventricular Angiogram: LVEF=55%. Hypokinesis of the apex.   Aortic rootwithout evidence of  dilatation or dissection.   Impression: 1. Double vessel CAD with patent stents in the proximal and mid LAD with diffuse small vessel disease in LAD and Circumflex 2. Preserved LV systolic function  Lexiscan Myoview 12/27/2016:  There was no ST segment deviation noted during stress.  This is a low risk study.  The left ventricular ejection fraction is mildly decreased (45-54%).  1. EF 52%, mild diffuse hypokinesis.  2. Primarily fixed medium-sized basal to mid inferior and basal to mid inferoseptal perfusion defect. There does not appear to be significant ischemia. Possible prior infarction versus diaphragmatic attenuation.   Given lack of ischemia, overall low risk study  Labs/Other Tests and Data Reviewed:    EKG:  An ECG dated 12/25/2017 was personally reviewed today and demonstrated:  Sinus rhythm with IVCD.  Recent Labs:  None recently.  Wt Readings from Last 3 Encounters:  01/20/19 182 lb (82.6 kg)  12/25/17 178 lb (80.7 kg)  01/09/17 187 lb (84.8 kg)     Objective:    Vital Signs:  BP (!) 170/96   Pulse 68   Ht 5\' 4"  (1.626 m)   Wt 182 lb (82.6 kg)   BMI 31.24 kg/m    Spoke in full sentences on the phone, not short of breath. No audible wheezing. Speech pattern normal.  ASSESSMENT & PLAN:    1.  CAD with history of DES to the proximal and mid LAD in 2013.  He reports no progressive angina at this time and will plan to continue with medical therapy.  No changes were made today.  2.  Hyperlipidemia, tolerating high-dose Lipitor.  Follow-[place] work [place].  3.  Essential hypertension, blood pressure elevated today.  He ran of lisinopril last week.  Prescription being provided.  COVID-19 Education: The signs and symptoms of COVID-19 were discussed with the patient and how to seek care for testing (follow up with PCP or arrange E-visit).  The importance of social distancing was discussed today.  Time:   Today, I have spent 6 minutes with  the patient with telehealth technology discussing the above problems.     Medication Adjustments/Labs and Tests Ordered: Current medicines are reviewed at length with the patient today.  Concerns regarding medicines are outlined above.   Tests Ordered: No orders of the defined types were placed in this encounter.   Medication Changes: Meds ordered this encounter  Medications  . hydrochlorothiazide (HYDRODIURIL) 25 MG tablet    Sig: Take 1 tablet (25 mg total) by mouth  daily.    Dispense:  90 tablet    Refill:  3  . lisinopril (ZESTRIL) 40 MG tablet    Sig: Take 1 tablet (40 mg total) by mouth daily.    Dispense:  90 tablet    Refill:  3    Disposition:  Follow up 6 months in the [place] office.  Signed, Nona Dell, MD  01/20/2019 3:15 PM    [facility]

## 2019-01-20 ENCOUNTER — Telehealth (INDEPENDENT_AMBULATORY_CARE_PROVIDER_SITE_OTHER): Payer: Medicare Other | Admitting: Cardiology

## 2019-01-20 ENCOUNTER — Encounter: Payer: Self-pay | Admitting: Cardiology

## 2019-01-20 VITALS — BP 170/96 | HR 68 | Ht 64.0 in | Wt 182.0 lb

## 2019-01-20 DIAGNOSIS — Z7189 Other specified counseling: Secondary | ICD-10-CM | POA: Diagnosis not present

## 2019-01-20 DIAGNOSIS — I10 Essential (primary) hypertension: Secondary | ICD-10-CM

## 2019-01-20 DIAGNOSIS — I25119 Atherosclerotic heart disease of native coronary artery with unspecified angina pectoris: Secondary | ICD-10-CM

## 2019-01-20 DIAGNOSIS — E782 Mixed hyperlipidemia: Secondary | ICD-10-CM

## 2019-01-20 MED ORDER — HYDROCHLOROTHIAZIDE 25 MG PO TABS: 25.0000 mg | ORAL_TABLET | Freq: Every day | ORAL | 3 refills | Status: DC

## 2019-01-20 MED ORDER — LISINOPRIL 40 MG PO TABS: 40.0000 mg | ORAL_TABLET | Freq: Every day | ORAL | 3 refills | Status: DC

## 2019-01-20 NOTE — Patient Instructions (Signed)
Medication Instructions: Your physician recommends that you continue on your current medications as directed. Please refer to the Current Medication list given to you today.   Labwork: None today  Procedures/Testing: None today  Follow-Up: 6 months with Dr.McDowell  Any Additional Special Instructions Will Be Listed Below (If Applicable).     If you need a refill on your cardiac medications before your next appointment, please call your pharmacy.      Thank you for choosing [facility] !        

## 2019-01-28 ENCOUNTER — Other Ambulatory Visit: Payer: Self-pay | Admitting: Cardiovascular Disease

## 2019-02-23 ENCOUNTER — Other Ambulatory Visit: Payer: Self-pay | Admitting: Cardiovascular Disease

## 2019-04-08 ENCOUNTER — Other Ambulatory Visit: Payer: Self-pay | Admitting: Cardiology

## 2019-05-02 ENCOUNTER — Other Ambulatory Visit: Payer: Self-pay | Admitting: Cardiovascular Disease

## 2019-05-14 ENCOUNTER — Telehealth: Payer: Self-pay | Admitting: Cardiology

## 2019-05-14 NOTE — Telephone Encounter (Signed)
New Message   Pt c/o medication issue:  1. Name of Medication: statin (LIPITOR) 80 MG tablet   2. How are you currently taking this medication (dosage and times per day)?TAKE 1 TABLET BY MOUTH EVERY EVENING  3. Are you having a reaction (difficulty breathing--STAT)? Nose bleeds every other day. Not a heavy bleed though  4. What is your medication issue? Patient is calling because he was taking 2 tablets a day and now to 1. He is concerned that  This may be the reason of his nose bleed

## 2019-05-14 NOTE — Telephone Encounter (Signed)
Reports BP being elevated during last visits with his providers including the [facility] for past 3 months. Also reports having minor nosebleeds once every 36 hours that stops easily. Patient asking if his BP could be causing the nose bleeds. Patient was unable to give BP readings but will provide home BP reading to nurse while on phone. Medications reviewed and patient has been taking carvedilol daily. Advised that he needed to take his medications as prescribed, monitor his BP at home daily with consistency, record readings, and call office in 2 weeks with his numbers. Current BP 168/95. Advised to contact his PCP about his nose bleeds. Follow up appointment given to patient for November 2020 and advised to bring his home BP cuff so that it can be checked for accuracy. Denied chest pain, sob or dizziness. Verbalized understanding of plan.

## 2019-07-20 NOTE — Progress Notes (Signed)
AQ? 

## 2019-07-21 ENCOUNTER — Ambulatory Visit (INDEPENDENT_AMBULATORY_CARE_PROVIDER_SITE_OTHER): Payer: Medicare Other | Admitting: Cardiology

## 2019-07-21 ENCOUNTER — Other Ambulatory Visit (HOSPITAL_COMMUNITY)
Admission: RE | Admit: 2019-07-21 | Discharge: 2019-07-21 | Disposition: A | Discharge status: Home / Self Care | Payer: Medicare Other | Source: Ambulatory Visit | Attending: Family Medicine | Admitting: Family Medicine

## 2019-07-21 ENCOUNTER — Other Ambulatory Visit: Payer: Self-pay

## 2019-07-21 ENCOUNTER — Encounter: Payer: Self-pay | Admitting: Cardiology

## 2019-07-21 VITALS — BP 117/77 | HR 57 | Temp 96.2°F | Ht 65.0 in | Wt 181.0 lb

## 2019-07-21 DIAGNOSIS — I25119 Atherosclerotic heart disease of native coronary artery with unspecified angina pectoris: Secondary | ICD-10-CM

## 2019-07-21 DIAGNOSIS — E782 Mixed hyperlipidemia: Secondary | ICD-10-CM

## 2019-07-21 DIAGNOSIS — I10 Essential (primary) hypertension: Secondary | ICD-10-CM | POA: Diagnosis not present

## 2019-07-21 LAB — BASIC METABOLIC PANEL
Anion gap: 6 (ref 5–15)
BUN: 26 mg/dL — ABNORMAL HIGH (ref 8–23)
CO2: 27 mmol/L (ref 22–32)
Calcium: 9 mg/dL (ref 8.9–10.3)
Chloride: 104 mmol/L (ref 98–111)
Creatinine, Ser: 1.19 mg/dL (ref 0.61–1.24)
GFR calc Af Amer: >60 mL/min (ref >60)
GFR calc non Af Amer: >60 mL/min (ref >60)
Glucose, Bld: 92 mg/dL (ref 70–99)
Potassium: 3.6 mmol/L (ref 3.5–5.1)
Sodium: 137 mmol/L (ref 135–145)

## 2019-07-21 LAB — CBC
HCT: 35.9 % — ABNORMAL LOW (ref 39.0–52.0)
Hemoglobin: 11.6 g/dL — ABNORMAL LOW (ref 13.0–17.0)
MCH: 30.9 pg (ref 26.0–34.0)
MCHC: 32.3 g/dL (ref 30.0–36.0)
MCV: 95.7 fL (ref 80.0–100.0)
Platelets: 317 10*3/uL (ref 150–400)
RBC: 3.75 MIL/uL — ABNORMAL LOW (ref 4.22–5.81)
RDW: 15.1 % (ref 11.5–15.5)
WBC: 7 10*3/uL (ref 4.0–10.5)
nRBC: 0 % (ref 0.0–0.2)

## 2019-07-21 LAB — HEPATIC FUNCTION PANEL
ALT: 27 U/L (ref 0–44)
AST: 22 U/L (ref 15–41)
Albumin: 3.6 g/dL (ref 3.5–5.0)
Alkaline Phosphatase: 51 U/L (ref 38–126)
Bilirubin, Direct: <0.1 mg/dL (ref 0.0–0.2)
Total Bilirubin: 0.7 mg/dL (ref 0.3–1.2)
Total Protein: 7.4 g/dL (ref 6.5–8.1)

## 2019-07-21 LAB — LIPID PANEL
Cholesterol: 124 mg/dL (ref 0–200)
HDL: 35 mg/dL — ABNORMAL LOW (ref >40)
LDL Cholesterol: 75 mg/dL (ref 0–99)
Total CHOL/HDL Ratio: 3.5 RATIO
Triglycerides: 70 mg/dL (ref <150)
VLDL: 14 mg/dL (ref 0–40)

## 2019-07-21 MED ORDER — NITROGLYCERIN 0.4 MG SL SUBL: 0.4000 mg | SUBLINGUAL_TABLET | SUBLINGUAL | 3 refills | Status: DC | PRN

## 2019-07-21 MED ORDER — ATORVASTATIN CALCIUM 80 MG PO TABS: 80.0000 mg | ORAL_TABLET | Freq: Every evening | ORAL | 3 refills | Status: DC

## 2019-07-21 NOTE — Progress Notes (Signed)
Cardiology Office Note  Date: 07/21/2019   ID: Bradley Hurley, Bradley Hurley [birth year], MRN [id number]  PCP:  Gareth Morgan, MD  Cardiologist:  Nona Dell, MD Electrophysiologist:  None   Chief Complaint  Patient presents with  . Follow-up    History of Present Illness: Bradley Hurley is a 73 y.o. male last seen by Dr.McDowell telehealth visit in May 2020.  Patient has a history of coronary artery disease with stent placement in the past proximal and mid LAD stents. Hx of HTN, HLD, OSA, Obesity.  He denies any recent acute illnesses, hospitalizations, surgeries ,tick bites or travels. He is not very active on daily basis.  He states the only time he feels any anginal or exertional symptoms like symptoms is when exerting for long periods of time. He states he may have minor chest tightness or dyspnea which resolves quickly. No associated symptoms.  States he has some right facial numbness which he attributes to a dental infection last year. Also states he had an associated sinus infection during that time.    At last telehealth visit patient denied any progressive anginal or dyspnea-like symptoms.  He denied any increasing nitroglycerin use since previous visit. States his nitroglycerin SL has like expire and needs a refill.  His blood pressure was elevated at last visit due to running out of his lisinopril. Blood pressure today 117/77.   Patient has not had lab work in quite some time per his report.   Past Medical History:  Diagnosis Date  . Anxiety   . CAD (coronary artery disease)    DES to prox-mid LAD 2013  . Cholelithiasis   . Depression   . Deviated septum   . Diverticulosis   . Erectile dysfunction    Low serum testosterone level  . Essential hypertension   . Hemorrhoid   . Hyperlipidemia   . MI (myocardial infarction) (HCC)    Anterior STEMI 8/13  . Obesity   . Obstructive sleep apnea   . Tinnitus     Past Surgical History:  Procedure Laterality Date  .  APPENDECTOMY    . CARDIAC CATHETERIZATION  07/28/2012   20% distal left main, patent prox & mid LAD stents w/ 25% ISR of the latter (unchanged from 03/2012 cath, mid-LAD stent placed in 2003), 30-40% ostial LCx, 40-50% distal marginal, 30% distal serial RCA stenoses; LVEF 55% with apical HK  . COLONOSCOPY N/A 08/12/2013   Procedure: COLONOSCOPY;  Surgeon: Corbin Ade, MD;  [facility];  Service: Endoscopy;  Laterality: N/A;  10:15  . CORONARY ANGIOPLASTY WITH STENT PLACEMENT  2003   mid LAD  . CORONARY ANGIOPLASTY WITH STENT PLACEMENT  03/2012   STEMI s/p DES-prox LAD  . LEFT HEART CATHETERIZATION WITH CORONARY ANGIOGRAM N/A 04/10/2012   Procedure: LEFT HEART CATHETERIZATION WITH CORONARY ANGIOGRAM;  Surgeon: Kathleene Hazel, MD;  Location: [facility] CATH LAB;  Service: Cardiovascular;  Laterality: N/A;  . LEFT HEART CATHETERIZATION WITH CORONARY ANGIOGRAM N/A 07/28/2012   Procedure: LEFT HEART CATHETERIZATION WITH CORONARY ANGIOGRAM;  Surgeon: Kathleene Hazel, MD;  Location: [facility] CATH LAB;  Service: Cardiovascular;  Laterality: N/A;  . SPLENECTOMY    . TONSILLECTOMY      Current Outpatient Medications  Medication Sig Dispense Refill  . atorvastatin (LIPITOR) 80 MG tablet Take 1 tablet (80 mg total) by mouth every evening. 90 tablet 3  . carvedilol (COREG) 6.25 MG tablet TAKE 1 TABLET TWICE A DAY WITH FOOD 60 tablet 6  .  hydrochlorothiazide (HYDRODIURIL) 25 MG tablet Take 1 tablet (25 mg total) by mouth daily. 90 tablet 3  . isosorbide mononitrate (IMDUR) 30 MG 24 hr tablet TAKE 1 TABLET BY MOUTH ONCE DAILY 90 tablet 1  . lisinopril (ZESTRIL) 40 MG tablet Take 1 tablet (40 mg total) by mouth daily. 90 tablet 3  . LORazepam (ATIVAN) 1 MG tablet Take 1 mg by mouth every 8 (eight) hours. 1/2 to 1 Three Times Daily For Anxiety    . Multiple Vitamins-Minerals (MEGA MULTI MEN PO) Take 1 tablet by mouth daily as needed (energy).    . nitroGLYCERIN (NITROSTAT) 0.4 MG SL tablet  Place 1 tablet (0.4 mg total) under the tongue every 5 (five) minutes as needed for chest pain. 25 tablet 3  . prasugrel (EFFIENT) 10 MG TABS tablet TAKE 1 TABLET BY MOUTH ONCE DAILY 30 tablet 3   No current facility-administered medications for this visit.    Allergies:  Aspirin   Social History: The patient  reports that he quit smoking about 23 years ago. His smoking use included cigarettes. He has a 15.00 pack-year smoking history. He quit smokeless tobacco use about 38 years ago. He reports current alcohol use of about 1.0 standard drinks of alcohol per week. He reports that he does not use drugs.   Family History: The patient's family history includes Arrhythmia in his father; Cancer in his mother.   ROS:  Please see the history of present illness. Otherwise, complete review of systems is positive for none.  All other systems are reviewed and negative.   Physical Exam: VS:  BP 117/77   Pulse (!) 57   Temp (!) 96.2 F (35.7 C)   Ht 5\' 5"  (1.651 m)   Wt 181 lb (82.1 kg)   SpO2 96%   BMI 30.12 kg/m , BMI Body mass index is 30.12 kg/m.  Wt Readings from Last 3 Encounters:  07/21/19 181 lb (82.1 kg)  01/20/19 182 lb (82.6 kg)  12/25/17 178 lb (80.7 kg)    General: Patient appears comfortable at rest. Neck: Supple, no elevated JVP or carotid bruits, no thyromegaly. Lungs: Clear to auscultation, nonlabored breathing at rest. Cardiac: Regular rate and rhythm, no S3 or significant systolic murmur, no pericardial rub. Abdomen: Soft, nontender, no hepatomegaly, bowel sounds present, no guarding or rebound. Extremities: No pitting edema, distal pulses 2+. Skin: Warm and dry. Musculoskeletal: No kyphosis. Neuropsychiatric: Alert and oriented x3, affect grossly appropriate.  ECG:  An ECG dated 07/21/2019 was personally reviewed today and demonstrated:  Sinus bradycardia rate of 54 incomplete LBBB  Recent Labwork: No results found for requested labs within last 8760 hours.      Component Value Date/Time   CHOL 151 12/26/2016 1114   TRIG 134 12/26/2016 1114   HDL 38 (L) 12/26/2016 1114   CHOLHDL 4.0 12/26/2016 1114   VLDL 27 12/26/2016 1114   LDLCALC 86 12/26/2016 1114    Other Studies Reviewed Today: Echocardiogram 02/09/2015: Study Conclusions  - Left ventricle: The cavity size was normal. Wall thickness was increased in a pattern of mild LVH. Systolic function was normal. The estimated ejection fraction was in the range of 60% to 65%. Wall motion was normal; there were no regional wall motion abnormalities. Doppler parameters are consistent with abnormal left ventricular relaxation (grade 1 diastolic dysfunction). - Aortic valve: Mildly calcified annulus. Trileaflet; mildly thickened leaflets. - Mitral valve: Mildly thickened leaflets . There was mild regurgitation. - Left atrium: The atrium was mildly dilated. - Right  atrium: Central venous pressure (est): 3 mm Hg. - Tricuspid valve: There was trivial regurgitation. - Pulmonary arteries: Systolic pressure could not be accurately estimated. - Pericardium, extracardiac: There was no pericardial effusion.  Impressions:  - Mild LVH with LVEF 60-65%, grade 1 diastolic dysfunction. Mild left atrial enlargement. Mildly thickened mitral leaflets with mild mitral regurgitation. Mildly sclerotic aortic valve. Unable to assess PASP.  Cardiac catheterization 07/28/2012: Left main: Distal 20% stenosis.   Left Anterior Descending Artery: Large caliber vessel that courses to the apex and becomes smaller in caliber in the distal segment. There are patent stents in the proximal and mid vessel with no restenosis noted in the proximal segment. The stented segment in the mid vessel has 25% narrowing consistent with restenosis (of note, this is the area of stenting from 2003 and is unchanged in appearance from last cath in August 2013). The distal vessel is small in caliber and has serial  40% stenoses. The diagonal branch is moderate in caliber and has minimal plaque disease.   Circumflex Artery: Large caliber vessel that terminates into a marginal branch. The ostium of the vessel appears to have 30-40% stenosis. This does not appear to be flow limiting. There is a 40-50% stenosis in the mid portion of the marginal branch.  Right Coronary Artery: Large dominant vessel with luminal irregularities in the proximal and mid segment. The distal vessel has serial 30% stenoses. The PDA is a moderate caliber vessel with mild diffuse plaque. The PLA is a moderate sized vessel with mild diffuse plaque.   Left Ventricular Angiogram: LVEF=55%. Hypokinesis of the apex.   Aortic rootwithout evidence of dilatation or dissection.   Impression: 1. Double vessel CAD with patent stents in the proximal and mid LAD with diffuse small vessel disease in LAD and Circumflex 2. Preserved LV systolic function  Lexiscan Myoview 12/27/2016:  There was no ST segment deviation noted during stress.  This is a low risk study.  The left ventricular ejection fraction is mildly decreased (45-54%).  1. EF 52%, mild diffuse hypokinesis.  2. Primarily fixed medium-sized basal to mid inferior and basal to mid inferoseptal perfusion defect. There does not appear to be significant ischemia. Possible prior infarction versus diaphragmatic attenuation.   Given lack of ischemia, overall low risk study  Assessment and Plan:  1. Coronary artery disease involving native coronary artery of native heart with angina pectoris (HCC)   2. Mixed hyperlipidemia   3. Essential hypertension    1. Coronary artery disease involving native coronary artery of native heart with angina pectoris (HCC) Denies any significant anginal or exertional symptoms. Occasional chest tightness when exerting for long periods. Continue  Prasugrel 10 mg, Imdur 30 mg daily, Carvedilol 6.25 mg bid.  2. Mixed hyperlipidemia:  Has not had  fasting lipid profile and liver function tests in quite some time. Get FLP and LFT studies. On high dose atorvastatin 80 mg daily.    3. Essential hypertension: BP WNL limits today. Continue Lisinopril 40 mg and HCTZ 25 mg daily   Medication Adjustments/Labs and Tests Ordered: Current medicines are reviewed at length with the patient today.  Concerns regarding medicines are outlined above.    Patient Instructions  Medication Instructions:  Your physician recommends that you continue on your current medications as directed. Please refer to the Current Medication list given to you today.  *If you need a refill on your cardiac medications before your next appointment, please call your pharmacy*  Lab Work: Fasting Lipids, LFT'S,CBC,BMET  If  you have labs (blood work) drawn today and your tests are completely normal, you will receive your results only by: Marland Kitchen MyChart Message (if you have MyChart) OR . A paper copy in the mail If you have any lab test that is abnormal or we need to change your treatment, we will call you to review the results.  Testing/Procedures: None today  Follow-Up: At [facility], you and your health needs are our priority.  As part of our continuing mission to provide you with exceptional heart care, we have created designated Provider Care Teams.  These Care Teams include your primary Cardiologist (physician) and Advanced Practice Providers (APPs -  Physician Assistants and Nurse Practitioners) who all work together to provide you with the care you need, when you need it.  Your next appointment:   12 month(s)  The format for your next appointment:   In Person  Provider:   Nona Dell, MD  Other Instructions None      Thank you for choosing [facility] !                Signed, Rennis Harding, NP 07/21/2019 3:03 PM    [facility] at [facility] [address] [place], [place], [place] [address] Phone: [phone]; Fax: [phone]

## 2019-07-21 NOTE — Patient Instructions (Signed)
Medication Instructions:  Your physician recommends that you continue on your current medications as directed. Please refer to the Current Medication list given to you today.  *If you need a refill on your cardiac medications before your next appointment, please call your pharmacy*  Lab Work: Fasting Lipids, LFT'S,CBC,BMET  If you have labs (blood work) drawn today and your tests are completely normal, you will receive your results only by: Marland Kitchen MyChart Message (if you have MyChart) OR . A paper copy in the mail If you have any lab test that is abnormal or we need to change your treatment, we will call you to review the results.  Testing/Procedures: None today  Follow-Up: At [facility], you and your health needs are our priority.  As part of our continuing mission to provide you with exceptional heart care, we have created designated Provider Care Teams.  These Care Teams include your primary Cardiologist (physician) and Advanced Practice Providers (APPs -  Physician Assistants and Nurse Practitioners) who all work together to provide you with the care you need, when you need it.  Your next appointment:   12 month(s)  The format for your next appointment:   In Person  Provider:   Rozann Lesches, MD  Other Instructions None      Thank you for choosing [facility] !

## 2019-07-27 DIAGNOSIS — H2513 Age-related nuclear cataract, bilateral: Secondary | ICD-10-CM | POA: Diagnosis not present

## 2019-08-18 ENCOUNTER — Telehealth: Payer: Self-pay | Admitting: Cardiology

## 2019-08-18 MED ORDER — PRASUGREL HCL 10 MG PO TABS: 10.0000 mg | ORAL_TABLET | Freq: Every day | ORAL | 3 refills | Status: DC

## 2019-08-18 NOTE — Telephone Encounter (Signed)
Needing refill on prasugrel (EFFIENT) 10 MG TABS tablet [[id number]]  Sent to [facility]- pt has been w/o for 1 wk

## 2019-08-18 NOTE — Telephone Encounter (Signed)
DONE

## 2019-09-07 ENCOUNTER — Other Ambulatory Visit: Payer: Self-pay

## 2019-09-07 MED ORDER — ISOSORBIDE MONONITRATE ER 30 MG PO TB24: 30.0000 mg | ORAL_TABLET | Freq: Every day | ORAL | 3 refills | Status: DC

## 2019-09-07 NOTE — Telephone Encounter (Signed)
Refilled imdur per fax request 

## 2019-11-17 DIAGNOSIS — E7800 Pure hypercholesterolemia, unspecified: Secondary | ICD-10-CM | POA: Diagnosis not present

## 2019-11-17 DIAGNOSIS — Z7189 Other specified counseling: Secondary | ICD-10-CM | POA: Diagnosis not present

## 2019-11-17 DIAGNOSIS — Z9861 Coronary angioplasty status: Secondary | ICD-10-CM | POA: Diagnosis not present

## 2019-11-17 DIAGNOSIS — I2510 Atherosclerotic heart disease of native coronary artery without angina pectoris: Secondary | ICD-10-CM | POA: Diagnosis not present

## 2019-12-27 ENCOUNTER — Other Ambulatory Visit: Payer: Self-pay | Admitting: Cardiology

## 2020-01-12 DIAGNOSIS — D649 Anemia, unspecified: Secondary | ICD-10-CM | POA: Diagnosis not present

## 2020-01-12 DIAGNOSIS — K625 Hemorrhage of anus and rectum: Secondary | ICD-10-CM | POA: Diagnosis not present

## 2020-01-12 DIAGNOSIS — Z7901 Long term (current) use of anticoagulants: Secondary | ICD-10-CM | POA: Diagnosis not present

## 2020-01-12 DIAGNOSIS — R1030 Lower abdominal pain, unspecified: Secondary | ICD-10-CM | POA: Diagnosis not present

## 2020-01-13 ENCOUNTER — Emergency Department (HOSPITAL_COMMUNITY): Payer: Medicare Other

## 2020-01-13 ENCOUNTER — Inpatient Hospital Stay (HOSPITAL_COMMUNITY)
Admission: EM | Admit: 2020-01-13 | Discharge: 2020-01-16 | DRG: 392 | Disposition: A | Discharge status: Home / Self Care | Payer: Medicare Other | Attending: Internal Medicine | Admitting: Internal Medicine

## 2020-01-13 ENCOUNTER — Encounter (HOSPITAL_COMMUNITY): Payer: Self-pay | Admitting: Emergency Medicine

## 2020-01-13 ENCOUNTER — Other Ambulatory Visit: Payer: Self-pay

## 2020-01-13 DIAGNOSIS — I739 Peripheral vascular disease, unspecified: Secondary | ICD-10-CM | POA: Diagnosis present

## 2020-01-13 DIAGNOSIS — J322 Chronic ethmoidal sinusitis: Secondary | ICD-10-CM | POA: Diagnosis present

## 2020-01-13 DIAGNOSIS — K529 Noninfective gastroenteritis and colitis, unspecified: Secondary | ICD-10-CM | POA: Diagnosis present

## 2020-01-13 DIAGNOSIS — Z955 Presence of coronary angioplasty implant and graft: Secondary | ICD-10-CM | POA: Diagnosis not present

## 2020-01-13 DIAGNOSIS — I160 Hypertensive urgency: Secondary | ICD-10-CM | POA: Diagnosis present

## 2020-01-13 DIAGNOSIS — I214 Non-ST elevation (NSTEMI) myocardial infarction: Secondary | ICD-10-CM

## 2020-01-13 DIAGNOSIS — I471 Supraventricular tachycardia: Secondary | ICD-10-CM | POA: Diagnosis present

## 2020-01-13 DIAGNOSIS — R079 Chest pain, unspecified: Secondary | ICD-10-CM | POA: Diagnosis present

## 2020-01-13 DIAGNOSIS — I340 Nonrheumatic mitral (valve) insufficiency: Secondary | ICD-10-CM | POA: Diagnosis not present

## 2020-01-13 DIAGNOSIS — Z6829 Body mass index (BMI) 29.0-29.9, adult: Secondary | ICD-10-CM

## 2020-01-13 DIAGNOSIS — K0889 Other specified disorders of teeth and supporting structures: Secondary | ICD-10-CM | POA: Diagnosis present

## 2020-01-13 DIAGNOSIS — R0789 Other chest pain: Secondary | ICD-10-CM | POA: Diagnosis not present

## 2020-01-13 DIAGNOSIS — K047 Periapical abscess without sinus: Secondary | ICD-10-CM | POA: Diagnosis not present

## 2020-01-13 DIAGNOSIS — F419 Anxiety disorder, unspecified: Secondary | ICD-10-CM | POA: Diagnosis present

## 2020-01-13 DIAGNOSIS — I209 Angina pectoris, unspecified: Secondary | ICD-10-CM

## 2020-01-13 DIAGNOSIS — R5383 Other fatigue: Secondary | ICD-10-CM | POA: Diagnosis not present

## 2020-01-13 DIAGNOSIS — J189 Pneumonia, unspecified organism: Secondary | ICD-10-CM | POA: Diagnosis not present

## 2020-01-13 DIAGNOSIS — Z9114 Patient's other noncompliance with medication regimen: Secondary | ICD-10-CM

## 2020-01-13 DIAGNOSIS — E785 Hyperlipidemia, unspecified: Secondary | ICD-10-CM | POA: Diagnosis present

## 2020-01-13 DIAGNOSIS — J019 Acute sinusitis, unspecified: Secondary | ICD-10-CM | POA: Diagnosis present

## 2020-01-13 DIAGNOSIS — J320 Chronic maxillary sinusitis: Secondary | ICD-10-CM | POA: Diagnosis present

## 2020-01-13 DIAGNOSIS — Z886 Allergy status to analgesic agent status: Secondary | ICD-10-CM

## 2020-01-13 DIAGNOSIS — Z87891 Personal history of nicotine dependence: Secondary | ICD-10-CM | POA: Diagnosis not present

## 2020-01-13 DIAGNOSIS — Z20822 Contact with and (suspected) exposure to covid-19: Secondary | ICD-10-CM | POA: Diagnosis present

## 2020-01-13 DIAGNOSIS — T465X6A Underdosing of other antihypertensive drugs, initial encounter: Secondary | ICD-10-CM | POA: Diagnosis present

## 2020-01-13 DIAGNOSIS — E669 Obesity, unspecified: Secondary | ICD-10-CM | POA: Diagnosis present

## 2020-01-13 DIAGNOSIS — E7800 Pure hypercholesterolemia, unspecified: Secondary | ICD-10-CM | POA: Diagnosis present

## 2020-01-13 DIAGNOSIS — Z9081 Acquired absence of spleen: Secondary | ICD-10-CM | POA: Diagnosis not present

## 2020-01-13 DIAGNOSIS — I252 Old myocardial infarction: Secondary | ICD-10-CM

## 2020-01-13 DIAGNOSIS — F329 Major depressive disorder, single episode, unspecified: Secondary | ICD-10-CM | POA: Diagnosis present

## 2020-01-13 DIAGNOSIS — E6609 Other obesity due to excess calories: Secondary | ICD-10-CM | POA: Diagnosis not present

## 2020-01-13 DIAGNOSIS — D649 Anemia, unspecified: Secondary | ICD-10-CM | POA: Diagnosis present

## 2020-01-13 DIAGNOSIS — R0689 Other abnormalities of breathing: Secondary | ICD-10-CM | POA: Diagnosis not present

## 2020-01-13 DIAGNOSIS — K921 Melena: Secondary | ICD-10-CM

## 2020-01-13 DIAGNOSIS — I25119 Atherosclerotic heart disease of native coronary artery with unspecified angina pectoris: Secondary | ICD-10-CM | POA: Diagnosis present

## 2020-01-13 DIAGNOSIS — K5730 Diverticulosis of large intestine without perforation or abscess without bleeding: Secondary | ICD-10-CM | POA: Diagnosis present

## 2020-01-13 DIAGNOSIS — J329 Chronic sinusitis, unspecified: Secondary | ICD-10-CM | POA: Diagnosis not present

## 2020-01-13 DIAGNOSIS — Z79899 Other long term (current) drug therapy: Secondary | ICD-10-CM | POA: Diagnosis not present

## 2020-01-13 DIAGNOSIS — R799 Abnormal finding of blood chemistry, unspecified: Secondary | ICD-10-CM | POA: Diagnosis not present

## 2020-01-13 DIAGNOSIS — I10 Essential (primary) hypertension: Secondary | ICD-10-CM | POA: Diagnosis present

## 2020-01-13 DIAGNOSIS — Z7902 Long term (current) use of antithrombotics/antiplatelets: Secondary | ICD-10-CM

## 2020-01-13 DIAGNOSIS — G4733 Obstructive sleep apnea (adult) (pediatric): Secondary | ICD-10-CM | POA: Diagnosis present

## 2020-01-13 LAB — COMPREHENSIVE METABOLIC PANEL
ALT: 18 U/L (ref 0–44)
AST: 16 U/L (ref 15–41)
Albumin: 3.3 g/dL — ABNORMAL LOW (ref 3.5–5.0)
Alkaline Phosphatase: 57 U/L (ref 38–126)
Anion gap: 8 (ref 5–15)
BUN: 15 mg/dL (ref 8–23)
CO2: 27 mmol/L (ref 22–32)
Calcium: 8.8 mg/dL — ABNORMAL LOW (ref 8.9–10.3)
Chloride: 103 mmol/L (ref 98–111)
Creatinine, Ser: 0.99 mg/dL (ref 0.61–1.24)
GFR calc Af Amer: >60 mL/min (ref >60)
GFR calc non Af Amer: >60 mL/min (ref >60)
Glucose, Bld: 104 mg/dL — ABNORMAL HIGH (ref 70–99)
Potassium: 3.7 mmol/L (ref 3.5–5.1)
Sodium: 138 mmol/L (ref 135–145)
Total Bilirubin: 0.7 mg/dL (ref 0.3–1.2)
Total Protein: 7 g/dL (ref 6.5–8.1)

## 2020-01-13 LAB — CBC
HCT: 36.7 % — ABNORMAL LOW (ref 39.0–52.0)
Hemoglobin: 12.1 g/dL — ABNORMAL LOW (ref 13.0–17.0)
MCH: 31.7 pg (ref 26.0–34.0)
MCHC: 33 g/dL (ref 30.0–36.0)
MCV: 96.1 fL (ref 80.0–100.0)
Platelets: 315 10*3/uL (ref 150–400)
RBC: 3.82 MIL/uL — ABNORMAL LOW (ref 4.22–5.81)
RDW: 14.9 % (ref 11.5–15.5)
WBC: 9.1 10*3/uL (ref 4.0–10.5)
nRBC: 0 % (ref 0.0–0.2)

## 2020-01-13 LAB — TROPONIN I (HIGH SENSITIVITY): Troponin I (High Sensitivity): 12 ng/L (ref <18)

## 2020-01-13 LAB — LIPASE, BLOOD: Lipase: 22 U/L (ref 11–51)

## 2020-01-13 MED ORDER — LISINOPRIL 10 MG PO TABS
40.0000 mg | ORAL_TABLET | Freq: Once | ORAL | Status: AC
  Administered 2020-01-13: 40 mg via ORAL
  Filled 2020-01-13: qty 4

## 2020-01-13 MED ORDER — SODIUM CHLORIDE 0.9 % IV SOLN: INTRAVENOUS | Status: DC

## 2020-01-13 MED ORDER — NITROGLYCERIN 2 % TD OINT
1.0000 [in_us] | TOPICAL_OINTMENT | Freq: Once | TRANSDERMAL | Status: AC
  Administered 2020-01-13: 1 [in_us] via TOPICAL
  Filled 2020-01-13: qty 1

## 2020-01-13 NOTE — ED Triage Notes (Signed)
Pt here for CP that started about 30 mins PTA. Pt given nitro by EMS and pain increased from 3/10 to 5/10. Pt hasn't taken his HTN meds in about a week.

## 2020-01-13 NOTE — ED Provider Notes (Addendum)
[name] EMERGENCY DEPARTMENT Provider Note   CSN: [id number] Arrival date & time: 01/13/20  1945     History Chief Complaint  Patient presents with  . Chest Pain    Bradley Hurley is a 74 y.o. male.  HPI  HPI: A 74 year old patient with a history of hypertension and hypercholesterolemia presents for evaluation of chest pain. Initial onset of pain was approximately 1-3 hours ago. The patient's chest pain is described as heaviness/pressure/tightness, is not worse with exertion and is relieved by nitroglycerin. The patient's chest pain is middle- or left-sided, is not well-localized, is not sharp and does radiate to the arms/jaw/neck. The patient does not complain of nausea and denies diaphoresis. The patient has no history of stroke, has no history of peripheral artery disease, has not smoked in the past 90 days, denies any history of treated diabetes, has no relevant family history of coronary artery disease (first degree relative at less than age 29) and does not have an elevated BMI (>=30).  Patient does have history of coronary artery disease.  This do not feel exactly the same as similar episodes.  Patient states he has not been taking his medications regularly including his blood pressure medications.  Has been having issues with abdominal discomfort and has noted some blood in his stool.  He is seen his doctor and is being evaluated for colitis.  He stopped his medications thinking they could have been contributing Past Medical History:  Diagnosis Date  . Anxiety   . CAD (coronary artery disease)    DES to prox-mid LAD 2013  . Cholelithiasis   . Depression   . Deviated septum   . Diverticulosis   . Erectile dysfunction    Low serum testosterone level  . Essential hypertension   . Hemorrhoid   . Hyperlipidemia   . MI (myocardial infarction) ([place])    Anterior STEMI 8/13  . Obesity   . Obstructive sleep apnea   . Tinnitus     Patient Active Problem List   Diagnosis Date Noted  . Sinus bradycardia 12/27/2016  . Hypertensive urgency   . Cholelithiasis 02/09/2015  . Chest pain at rest 02/08/2015  . Chest pain with moderate risk for cardiac etiology 02/08/2015  . RUQ abdominal pain 02/08/2015  . Obstructive sleep apnea   . Anxiety   . Hemorrhoid   . Diverticulosis   . Obesity   . Nausea with vomiting   . Aspirin allergy 07/28/2012  . STEMI (ST elevation myocardial infarction) ([place]) 04/11/2012  . HTN (hypertension) 04/11/2012  . Hypokalemia 04/11/2012  . CAD (coronary artery disease) 04/11/2012  . Hyperlipidemia 04/11/2012    Past Surgical History:  Procedure Laterality Date  . APPENDECTOMY    . CARDIAC CATHETERIZATION  07/28/2012   20% distal left main, patent prox & mid LAD stents w/ 25% ISR of the latter (unchanged from 03/2012 cath, mid-LAD stent placed in 2003), 30-40% ostial LCx, 40-50% distal marginal, 30% distal serial RCA stenoses; LVEF 55% with apical HK  . COLONOSCOPY N/A 08/12/2013   Procedure: COLONOSCOPY;  Surgeon: Daneil Dolin, MD;  [facility];  Service: Endoscopy;  Laterality: N/A;  10:15  . CORONARY ANGIOPLASTY WITH STENT PLACEMENT  2003   mid LAD  . CORONARY ANGIOPLASTY WITH STENT PLACEMENT  03/2012   STEMI s/p DES-prox LAD  . LEFT HEART CATHETERIZATION WITH CORONARY ANGIOGRAM N/A 04/10/2012   Procedure: LEFT HEART CATHETERIZATION WITH CORONARY ANGIOGRAM;  Surgeon: Burnell Blanks, MD;  Location: [facility]  CATH LAB;  Service: Cardiovascular;  Laterality: N/A;  . LEFT HEART CATHETERIZATION WITH CORONARY ANGIOGRAM N/A 07/28/2012   Procedure: LEFT HEART CATHETERIZATION WITH CORONARY ANGIOGRAM;  Surgeon: Kathleene Hazel, MD;  Location: [facility] CATH LAB;  Service: Cardiovascular;  Laterality: N/A;  . SPLENECTOMY    . TONSILLECTOMY         Family History  Problem Relation Age of Onset  . Cancer Mother   . Arrhythmia Father     Social History   Tobacco Use  . Smoking status: Former Smoker     Packs/day: 1.00    Years: 15.00    Pack years: 15.00    Types: Cigarettes    Quit date: 09/03/1995    Years since quitting: 24.3  . Smokeless tobacco: Former Neurosurgeon    Quit date: 01/30/1981  . Tobacco comment: Quit in 1982  Substance Use Topics  . Alcohol use: Yes    Alcohol/week: 1.0 standard drinks    Types: 1 Standard drinks or equivalent per week    Comment: occ  . Drug use: No    Home Medications Prior to Admission medications   Medication Sig Start Date End Date Taking? Authorizing Provider  atorvastatin (LIPITOR) 80 MG tablet Take 1 tablet (80 mg total) by mouth every evening. 07/21/19  Yes Netta Neat., NP  carvedilol (COREG) 6.25 MG tablet TAKE 1 TABLET TWICE A DAY WITH FOOD Patient taking differently: Take 6.25 mg by mouth 2 (two) times daily with a meal.  05/05/19  Yes Laqueta Linden, MD  hydrochlorothiazide (HYDRODIURIL) 25 MG tablet Take 1 tablet (25 mg total) by mouth daily. 01/20/19  Yes Jonelle Sidle, MD  isosorbide mononitrate (IMDUR) 30 MG 24 hr tablet Take 1 tablet (30 mg total) by mouth daily. 09/07/19  Yes Jonelle Sidle, MD  lisinopril (ZESTRIL) 40 MG tablet Take 1 tablet (40 mg total) by mouth daily. 01/20/19 01/13/20 Yes Jonelle Sidle, MD  LORazepam (ATIVAN) 1 MG tablet Take 1 mg by mouth at bedtime.    Yes [provider]  Multiple Vitamins-Minerals (MEGA MULTI MEN PO) Take 1 tablet by mouth daily as needed (energy).   Yes [provider]  nitroGLYCERIN (NITROSTAT) 0.4 MG SL tablet Place 1 tablet (0.4 mg total) under the tongue every 5 (five) minutes as needed for chest pain. 07/21/19  Yes Netta Neat., NP  prasugrel (EFFIENT) 10 MG TABS tablet TAKE 1 TABLET(10 MG) BY MOUTH DAILY Patient taking differently: Take 10 mg by mouth daily.  12/28/19  Yes Jonelle Sidle, MD    Allergies    Aspirin  Review of Systems   Review of Systems  Physical Exam Updated Vital Signs BP (!) 183/86   Pulse 82   Temp 98.5 F (36.9  C) (Oral)   Resp 16   Ht 1.651 m (5\' 5" )   Wt 81.6 kg   SpO2 98%   BMI 29.95 kg/m   Physical Exam Vitals and nursing note reviewed.  Constitutional:      Appearance: He is well-developed. He is not toxic-appearing or diaphoretic.  HENT:     Head: Normocephalic and atraumatic.     Right Ear: External ear normal.     Left Ear: External ear normal.  Eyes:     General: No scleral icterus.       Right eye: No discharge.        Left eye: No discharge.     Conjunctiva/sclera: Conjunctivae normal.  Neck:  Trachea: No tracheal deviation.  Cardiovascular:     Rate and Rhythm: Normal rate and regular rhythm.  Pulmonary:     Effort: Pulmonary effort is normal. No respiratory distress.     Breath sounds: Normal breath sounds. No stridor. No wheezing or rales.  Abdominal:     General: Bowel sounds are normal. There is no distension.     Palpations: Abdomen is soft.     Tenderness: There is no abdominal tenderness. There is no guarding or rebound.  Musculoskeletal:        General: No tenderness.     Cervical back: Neck supple.  Skin:    General: Skin is warm and dry.     Findings: No rash.  Neurological:     Mental Status: He is alert.     Cranial Nerves: No cranial nerve deficit (no facial droop, extraocular movements intact, no slurred speech).     Sensory: No sensory deficit.     Motor: No abnormal muscle tone or seizure activity.     Coordination: Coordination normal.     ED Results / Procedures / Treatments   Labs (all labs ordered are listed, but only abnormal results are displayed) Labs Reviewed  CBC - Abnormal; Notable for the following components:      Result Value   RBC 3.82 (*)    Hemoglobin 12.1 (*)    HCT 36.7 (*)    All other components within normal limits  COMPREHENSIVE METABOLIC PANEL - Abnormal; Notable for the following components:   Glucose, Bld 104 (*)    Calcium 8.8 (*)    Albumin 3.3 (*)    All other components within normal limits  LIPASE,  BLOOD  TROPONIN I (HIGH SENSITIVITY)  TROPONIN I (HIGH SENSITIVITY)    EKG EKG Interpretation  Date/Time:  Wednesday Jan 13 2020 20:06:54 EDT Ventricular Rate:  73 PR Interval:    QRS Duration: 91 QT Interval:  387 QTC Calculation: 427 R Axis:   43 Text Interpretation: Sinus rhythm Low voltage, precordial leads Abnormal R-wave progression, early transition Minimal ST depression, lateral leads Minimal ST elevation, inferior leads Baseline wander in lead(s) V4 No significant change since last tracing Confirmed by Linwood Dibbles [phone]) on 01/13/2020 8:17:10 PM   Radiology DG Chest Portable 1 View  Result Date: 01/13/2020 CLINICAL DATA:  74 year old male with acute chest pain. Former smoker. EXAM: PORTABLE CHEST 1 VIEW COMPARISON:  Chest radiographs 07/03/2016 and earlier. FINDINGS: Portable AP upright view at 2058 hours. Lung volumes and mediastinal contours remain normal. Left coronary artery calcified plaque and/or stent. Visualized tracheal air column is within normal limits. No pneumothorax. Allowing for portable technique the lungs are clear. Negative visible bowel gas pattern. No acute osseous abnormality identified. IMPRESSION: Negative portable chest. Electronically Signed   By: Odessa Fleming M.D.   On: 01/13/2020 21:13    Procedures Procedures (including critical care time)  Medications Ordered in ED Medications  0.9 %  sodium chloride infusion ( Intravenous New Bag/Given (Non-Interop) 01/13/20 2104)  nitroGLYCERIN (NITROGLYN) 2 % ointment 1 inch (1 inch Topical Given 01/13/20 2326)  lisinopril (ZESTRIL) tablet 40 mg (40 mg Oral Given 01/13/20 2105)    ED Course  I have reviewed the triage vital signs and the nursing notes.  Pertinent labs & imaging results that were available during my care of the patient were reviewed by me and considered in my medical decision making (see chart for details).    MDM Rules/Calculators/A&P HEAR Score: 7  Patient presented to ED  with complaints of chest pain.  He has a history of heart disease.  Patient has not been compliant with his blood pressure medications.  Pt has a high risk heart score.  Per pathway will need admission, serial enzymes and cardiology consultation.  Will check delta troponin.    Final Clinical Impression(s) / ED Diagnoses Final diagnoses:  Chest pain, unspecified type    Rx / DC Orders ED Discharge Orders    None       Linwood Dibbles, MD 01/14/20 0010 pe addendum   Linwood Dibbles, MD 01/25/20 [phone]

## 2020-01-14 ENCOUNTER — Other Ambulatory Visit: Payer: Self-pay

## 2020-01-14 ENCOUNTER — Other Ambulatory Visit: Payer: Self-pay | Admitting: Cardiology

## 2020-01-14 ENCOUNTER — Observation Stay (HOSPITAL_COMMUNITY): Payer: Medicare Other

## 2020-01-14 ENCOUNTER — Encounter (HOSPITAL_COMMUNITY): Payer: Self-pay | Admitting: Internal Medicine

## 2020-01-14 DIAGNOSIS — F419 Anxiety disorder, unspecified: Secondary | ICD-10-CM | POA: Diagnosis present

## 2020-01-14 DIAGNOSIS — E785 Hyperlipidemia, unspecified: Secondary | ICD-10-CM

## 2020-01-14 DIAGNOSIS — D649 Anemia, unspecified: Secondary | ICD-10-CM | POA: Diagnosis present

## 2020-01-14 DIAGNOSIS — I340 Nonrheumatic mitral (valve) insufficiency: Secondary | ICD-10-CM | POA: Diagnosis not present

## 2020-01-14 DIAGNOSIS — I10 Essential (primary) hypertension: Secondary | ICD-10-CM

## 2020-01-14 DIAGNOSIS — Z886 Allergy status to analgesic agent status: Secondary | ICD-10-CM | POA: Diagnosis not present

## 2020-01-14 DIAGNOSIS — Z955 Presence of coronary angioplasty implant and graft: Secondary | ICD-10-CM | POA: Diagnosis not present

## 2020-01-14 DIAGNOSIS — K921 Melena: Secondary | ICD-10-CM | POA: Diagnosis not present

## 2020-01-14 DIAGNOSIS — E782 Mixed hyperlipidemia: Secondary | ICD-10-CM

## 2020-01-14 DIAGNOSIS — R079 Chest pain, unspecified: Secondary | ICD-10-CM | POA: Diagnosis not present

## 2020-01-14 DIAGNOSIS — I160 Hypertensive urgency: Secondary | ICD-10-CM | POA: Diagnosis present

## 2020-01-14 DIAGNOSIS — J329 Chronic sinusitis, unspecified: Secondary | ICD-10-CM | POA: Diagnosis not present

## 2020-01-14 DIAGNOSIS — R0789 Other chest pain: Secondary | ICD-10-CM | POA: Diagnosis not present

## 2020-01-14 DIAGNOSIS — Z9081 Acquired absence of spleen: Secondary | ICD-10-CM | POA: Diagnosis not present

## 2020-01-14 DIAGNOSIS — J322 Chronic ethmoidal sinusitis: Secondary | ICD-10-CM | POA: Diagnosis present

## 2020-01-14 DIAGNOSIS — G4733 Obstructive sleep apnea (adult) (pediatric): Secondary | ICD-10-CM | POA: Diagnosis present

## 2020-01-14 DIAGNOSIS — I252 Old myocardial infarction: Secondary | ICD-10-CM | POA: Diagnosis not present

## 2020-01-14 DIAGNOSIS — I471 Supraventricular tachycardia: Secondary | ICD-10-CM | POA: Diagnosis present

## 2020-01-14 DIAGNOSIS — I209 Angina pectoris, unspecified: Secondary | ICD-10-CM | POA: Diagnosis present

## 2020-01-14 DIAGNOSIS — E7800 Pure hypercholesterolemia, unspecified: Secondary | ICD-10-CM | POA: Diagnosis present

## 2020-01-14 DIAGNOSIS — I739 Peripheral vascular disease, unspecified: Secondary | ICD-10-CM | POA: Diagnosis present

## 2020-01-14 DIAGNOSIS — I25119 Atherosclerotic heart disease of native coronary artery with unspecified angina pectoris: Secondary | ICD-10-CM | POA: Diagnosis present

## 2020-01-14 DIAGNOSIS — J320 Chronic maxillary sinusitis: Secondary | ICD-10-CM | POA: Diagnosis present

## 2020-01-14 DIAGNOSIS — Z6829 Body mass index (BMI) 29.0-29.9, adult: Secondary | ICD-10-CM | POA: Diagnosis not present

## 2020-01-14 DIAGNOSIS — Z87891 Personal history of nicotine dependence: Secondary | ICD-10-CM | POA: Diagnosis not present

## 2020-01-14 DIAGNOSIS — Z20822 Contact with and (suspected) exposure to covid-19: Secondary | ICD-10-CM | POA: Diagnosis present

## 2020-01-14 DIAGNOSIS — J019 Acute sinusitis, unspecified: Secondary | ICD-10-CM | POA: Diagnosis present

## 2020-01-14 DIAGNOSIS — F329 Major depressive disorder, single episode, unspecified: Secondary | ICD-10-CM | POA: Diagnosis present

## 2020-01-14 DIAGNOSIS — E669 Obesity, unspecified: Secondary | ICD-10-CM | POA: Diagnosis present

## 2020-01-14 DIAGNOSIS — K529 Noninfective gastroenteritis and colitis, unspecified: Secondary | ICD-10-CM | POA: Diagnosis present

## 2020-01-14 DIAGNOSIS — Z79899 Other long term (current) drug therapy: Secondary | ICD-10-CM | POA: Diagnosis not present

## 2020-01-14 LAB — ECHOCARDIOGRAM COMPLETE
Height: 65 in
Weight: 2880 oz

## 2020-01-14 LAB — LIPID PANEL
Cholesterol: 180 mg/dL (ref 0–200)
HDL: 41 mg/dL (ref >40)
LDL Cholesterol: 128 mg/dL — ABNORMAL HIGH (ref 0–99)
Total CHOL/HDL Ratio: 4.4 RATIO
Triglycerides: 56 mg/dL (ref <150)
VLDL: 11 mg/dL (ref 0–40)

## 2020-01-14 LAB — TROPONIN I (HIGH SENSITIVITY)
Troponin I (High Sensitivity): 66 ng/L — ABNORMAL HIGH (ref <18)
Troponin I (High Sensitivity): 161 ng/L (ref <18)

## 2020-01-14 LAB — MAGNESIUM: Magnesium: 1.8 mg/dL (ref 1.7–2.4)

## 2020-01-14 LAB — SARS CORONAVIRUS 2 BY RT PCR (HOSPITAL ORDER, PERFORMED IN ~~LOC~~ HOSPITAL LAB): SARS Coronavirus 2: NEGATIVE

## 2020-01-14 MED ORDER — ATORVASTATIN CALCIUM 40 MG PO TABS
80.0000 mg | ORAL_TABLET | Freq: Every evening | ORAL | Status: DC
  Administered 2020-01-14 – 2020-01-15 (×2): 80 mg via ORAL
  Filled 2020-01-14 (×2): qty 2

## 2020-01-14 MED ORDER — BISACODYL 5 MG PO TBEC
10.0000 mg | DELAYED_RELEASE_TABLET | Freq: Once | ORAL | Status: AC
  Administered 2020-01-14: 10 mg via ORAL
  Filled 2020-01-14: qty 2

## 2020-01-14 MED ORDER — ENOXAPARIN SODIUM 40 MG/0.4ML ~~LOC~~ SOLN: 40.0000 mg | Freq: Every day | SUBCUTANEOUS | Status: DC

## 2020-01-14 MED ORDER — CARVEDILOL 3.125 MG PO TABS
6.2500 mg | ORAL_TABLET | Freq: Two times a day (BID) | ORAL | Status: DC
  Administered 2020-01-14 – 2020-01-16 (×5): 6.25 mg via ORAL
  Filled 2020-01-14 (×3): qty 2

## 2020-01-14 MED ORDER — POTASSIUM CHLORIDE CRYS ER 20 MEQ PO TBCR
20.0000 meq | EXTENDED_RELEASE_TABLET | Freq: Once | ORAL | Status: AC
  Administered 2020-01-14: 20 meq via ORAL
  Filled 2020-01-14: qty 1

## 2020-01-14 MED ORDER — NITROGLYCERIN 0.4 MG SL SUBL: 0.4000 mg | SUBLINGUAL_TABLET | SUBLINGUAL | Status: DC | PRN

## 2020-01-14 MED ORDER — ACETAMINOPHEN 325 MG PO TABS: 650.0000 mg | ORAL_TABLET | ORAL | Status: DC | PRN

## 2020-01-14 MED ORDER — LABETALOL HCL 5 MG/ML IV SOLN: 10.0000 mg | Freq: Four times a day (QID) | INTRAVENOUS | Status: DC | PRN

## 2020-01-14 MED ORDER — ONDANSETRON HCL 4 MG/2ML IJ SOLN
4.0000 mg | Freq: Four times a day (QID) | INTRAMUSCULAR | Status: DC | PRN
  Administered 2020-01-15: 4 mg via INTRAVENOUS
  Filled 2020-01-14: qty 2

## 2020-01-14 MED ORDER — PEG 3350-KCL-NA BICARB-NACL 420 G PO SOLR
4000.0000 mL | Freq: Once | ORAL | Status: AC
  Administered 2020-01-14: 4000 mL via ORAL

## 2020-01-14 MED ORDER — ISOSORBIDE MONONITRATE ER 60 MG PO TB24
30.0000 mg | ORAL_TABLET | Freq: Every day | ORAL | Status: DC
  Administered 2020-01-14 – 2020-01-16 (×3): 30 mg via ORAL
  Filled 2020-01-14 (×3): qty 1

## 2020-01-14 NOTE — Plan of Care (Signed)

## 2020-01-14 NOTE — ED Provider Notes (Signed)
Patient left at change of shift to get results of his delta troponin.  His heart score was 7 so he will need admission for observation.  Results of his delta troponin were needed though to determine if he needed to stay here or be transported to [facility].  When I review Dr. Ival Bible, cardiology note he was last seen May a year ago.  He had a stent placed in his LAD in 2013.  12:30 AM patient states he is currently pain-free.  We discussed the results of his delta troponin and need for admission.   12:38 AM Dr. Okey Dupre, cardiology has reviewed patient and feels like he can stay at [facility] and see cardiology here in the morning.  He has a history of some angina in the past and he feels like this episode is probably related to him not taking his blood pressure medications the past week.  Patient's blood pressure was very high when he first arrived in the ED at 197/97.  Currently his blood pressure is 156/75.  1:16 AM Dr. Welton Flakes, hospitalist will admit  Results for orders placed or performed during the hospital encounter of 01/13/20  Troponin I (High Sensitivity)  Result Value Ref Range   Troponin I (High Sensitivity) 12 <18 ng/L  Troponin I (High Sensitivity)  Result Value Ref Range   Troponin I (High Sensitivity) 66 (H) <18 ng/L    Diagnoses that have been ruled out:  None  Diagnoses that are still under consideration:  None  Final diagnoses:  Anginal pain (HCC)  NSTEMI (non-ST elevated myocardial infarction) [facility])  Essential hypertension   Plan admission  Devoria Albe, MD, Concha Pyo, MD 01/14/20 [phone]

## 2020-01-14 NOTE — Progress Notes (Signed)
PROGRESS NOTE  Bradley Hurley MGQ:[id number] DOB: 08-17-1946 DOA: 01/13/2020 PCP: Gareth Morgan, MD  Brief History:  74 year old male with a history of hypertension, hyperlipidemia, OSA, coronary artery disease, depression presenting with chest discomfort that began in the evening of 01/13/2020 after he ate a hot dog.  He described some heaviness and pressure that substernal radiating to the left and chin.  He denied any shortness of breath, fevers, chills, cough, hemoptysis, nausea, vomiting.  He stated that he quit taking all his medications a week ago because he felt like they were causing his stomach to be burning. In addition, the patient has been complaining of intermittent hematochezia for the past 2 to 3 weeks.  He denies any NSAID use.  He has had some intermittent loose stools.  There is no melena, abdominal pain, dysuria, hematuria.  He quit taking his Effient as well as the rest of his medications about 1 week prior to this admission.  Assessment/Plan: Chest pain/coronary artery disease -In part due to the patient's elevated blood pressure -Somewhat atypical by history -Cardiology consult -Troponin 12>>>66 -Personally reviewed EKG--sinus rhythm, nonspecific T wave changes -Personally reviewed chest x-ray--no consolidations or edema -Restart carvedilol and Imdur -Restart lisinopril  Hematochezia -GI consult -Holding Lovenox and Effient pending work-up -Hemoglobin appears fairly stable  Hypertensive urgency -Patient quit taking his antihypertensive medications for 1 week prior to admission -Restart carvedilol, lisinopril, Imdur  Hyperlipidemia -Restart statin  Sinus/Dental pain -CT maxillofacial     Status is: Observation    Dispo: The patient is from: Home              Anticipated d/c is to: Home              Anticipated d/c date is: 1 day              Patient currently not medically stable--continued hematochezia; awaiting  consultants        Family Communication:   No Family at bedside  Consultants:  GI, cardiology  Code Status:  FULL  DVT Prophylaxis:  [place] Ds   Procedures: As Listed in Progress Note Above  Antibiotics: None      Subjective: Patient denies fevers, chills, headache, chest pain, dyspnea, nausea, vomiting, diarrhea, abdominal pain, dysuria, hematuria, and melena.  Complains of hemtochezia   Objective: Vitals:   01/14/20 0130 01/14/20 0243 01/14/20 0244 01/14/20 0640  BP: (!) 144/73 124/77  108/70  Pulse: 78 87  86  Resp: 19 18  20   Temp:  99 F (37.2 C)  99.5 F (37.5 C)  TempSrc:  Oral  Oral  SpO2: 93% 97%  97%  Weight:      Height:   5\' 5"  (1.651 m)     Intake/Output Summary (Last 24 hours) at 01/14/2020 0740 Last data filed at 01/14/2020 01/16/2020 Gross per 24 hour  Intake 779.17 ml  Output --  Net 779.17 ml   Weight change:  Exam:   General:  Pt is alert, follows commands appropriately, not in acute distress  HEENT: No icterus, No thrush, No neck mass, [place]/AT  Cardiovascular: RRR, S1/S2, no rubs, no gallops  Respiratory: CTA bilaterally, no wheezing, no crackles, no rhonchi  Abdomen: Soft/+BS, non tender, non distended, no guarding  Extremities: No edema, No lymphangitis, No petechiae, No rashes, no synovitis   Data Reviewed: I have personally reviewed following labs and imaging studies Basic Metabolic Panel: Recent Labs  Lab 01/13/20 2055  NA  138  K 3.7  CL 103  CO2 27  GLUCOSE 104*  BUN 15  CREATININE 0.99  CALCIUM 8.8*   Liver Function Tests: Recent Labs  Lab 01/13/20 2055  AST 16  ALT 18  ALKPHOS 57  BILITOT 0.7  PROT 7.0  ALBUMIN 3.3*   Recent Labs  Lab 01/13/20 2055  LIPASE 22   No results for input(s): AMMONIA in the last 168 hours. Coagulation Profile: No results for input(s): INR, PROTIME in the last 168 hours. CBC: Recent Labs  Lab 01/13/20 2055  WBC 9.1  HGB 12.1*  HCT 36.7*  MCV 96.1  PLT 315   Cardiac  Enzymes: No results for input(s): CKTOTAL, CKMB, CKMBINDEX, TROPONINI in the last 168 hours. BNP: Invalid input(s): POCBNP CBG: No results for input(s): GLUCAP in the last 168 hours. HbA1C: No results for input(s): HGBA1C in the last 72 hours. Urine analysis:    Component Value Date/Time   COLORURINE YELLOW 07/27/2012 0416   APPEARANCEUR CLEAR 07/27/2012 0416   LABSPEC 1.018 07/27/2012 0416   PHURINE 6.0 07/27/2012 0416   GLUCOSEU NEGATIVE 07/27/2012 0416   HGBUR NEGATIVE 07/27/2012 0416   BILIRUBINUR NEGATIVE 07/27/2012 0416   KETONESUR NEGATIVE 07/27/2012 0416   PROTEINUR NEGATIVE 07/27/2012 0416   UROBILINOGEN 0.2 07/27/2012 0416   NITRITE NEGATIVE 07/27/2012 0416   LEUKOCYTESUR NEGATIVE 07/27/2012 0416   Sepsis Labs: @LABRCNTIP (procalcitonin:4,lacticidven:4) ) Recent Results (from the past 240 hour(s))  SARS Coronavirus 2 by RT PCR (hospital order, performed in [facility] hospital lab) Nasopharyngeal Nasopharyngeal Swab     Status: None   Collection Time: 01/14/20  1:29 AM   Specimen: Nasopharyngeal Swab  Result Value Ref Range Status   SARS Coronavirus 2 NEGATIVE NEGATIVE Final    Comment: (NOTE) SARS-CoV-2 target nucleic acids are NOT DETECTED. The SARS-CoV-2 RNA is generally detectable in upper and lower respiratory specimens during the acute phase of infection. The lowest concentration of SARS-CoV-2 viral copies this assay can detect is 250 copies / mL. A negative result does not preclude SARS-CoV-2 infection and should not be used as the sole basis for treatment or other patient management decisions.  A negative result may occur with improper specimen collection / handling, submission of specimen other than nasopharyngeal swab, presence of viral mutation(s) within the areas targeted by this assay, and inadequate number of viral copies (<250 copies / mL). A negative result must be combined with clinical observations, patient history, and epidemiological  information. Fact Sheet for Patients:   StrictlyIdeas.no Fact Sheet for Healthcare Providers: BankingDealers.co.za This test is not yet approved or cleared  by the [place] FDA and has been authorized for detection and/or diagnosis of SARS-CoV-2 by FDA under an Emergency Use Authorization (EUA).  This EUA will remain in effect (meaning this test can be used) for the duration of the COVID-19 declaration under Section 564(b)(1) of the Act, 21 U.S.C. section 360bbb-3(b)(1), unless the authorization is terminated or revoked sooner. Performed at [facility], [address] [place], [place] [address]      Scheduled Meds: . enoxaparin (LOVENOX) injection  40 mg Subcutaneous Daily   Continuous Infusions: . sodium chloride 125 mL/hr at 01/14/20 2878    Procedures/Studies: DG Chest Portable 1 View  Result Date: 01/13/2020 CLINICAL DATA:  74 year old male with acute chest pain. Former smoker. EXAM: PORTABLE CHEST 1 VIEW COMPARISON:  Chest radiographs 07/03/2016 and earlier. FINDINGS: Portable AP upright view at 2058 hours. Lung volumes and mediastinal contours remain normal. Left coronary artery calcified plaque  and/or stent. Visualized tracheal air column is within normal limits. No pneumothorax. Allowing for portable technique the lungs are clear. Negative visible bowel gas pattern. No acute osseous abnormality identified. IMPRESSION: Negative portable chest. Electronically Signed   By: Odessa Fleming M.D.   On: 01/13/2020 21:13    Catarina Hartshorn, DO  [facility]  If 7PM-7AM, please contact night-coverage [email or web address] Password [email or web address] 01/14/2020, 7:40 AM   LOS: 0 days

## 2020-01-14 NOTE — H&P (Signed)
History and Physical    Bradley Hurley:[id number] DOB: [birth year] DOA: 01/13/2020  PCP: Gareth Morgan, MD (Confirm with patient/family/NH records and if not entered, this has to be entered at [facility] point of entry) Patient coming from: Home  I have personally briefly reviewed patient's old medical records in [facility]  Chief Complaint: Chest pain  HPI: Bradley Hurley is a 74 y.o. male with medical history significant of shin, hyperlipidemia, coronary artery disease s/p stent placement and obesity presented to ED for evaluation of chest pain chest pain.  Is located in the center of chest, heavy pressure-like sensation, 6 out of 10 on pain scale, not radiating to left arm or jaw, improved with nitro glycine but did not worse with exertion.  Also denies dyspnea, lightheadedness and paresis.  Patient also denies any recent injury to the chest or recent travel history.  Patient states that this pain is not similar to the previous episode when he had coronary artery disease.  Patient also denies fever, chills, cough, GERD, nausea, vomiting, abdominal pain and urinary symptoms.  Patient further mentioned that he did not take his hypertension medication for more than a week.  Patient mentioned that he quit smoking long time ago.                   (For level 3, the HPI must include 4+ descriptors: Location, Quality, Severity, Duration, Timing, Context, modifying factors, associated signs/symptoms and/or status of 3+ chronic problems.)  (Please avoid self-populating past medical history here) (The initial 2-3 lines should be focused and good to copy and paste in the HPI section of the daily progress note).  ED Course:   On arrival to the ED patient had temperature 98.5, blood pressure 197/97, heart rate 69, respiratory rate 17 and oxygen saturation 97% on room air.  Blood work showed WBC 9.1, hemoglobin 12.1, sodium 138, potassium 3.7, BUN 15, creatinine 0.9, blood glucose 104.  First set of  troponin was 12 and second set of troponin was 66.  EKG showed sinus rhythm with low voltage, minimal ST depression and lateral leads minimal ST elevation otherwise no significant changes from the previous EKG.  Patient was given lisinopril and nitroglycerine  paste in the ED.  ED physician contacted Dr. Rose/cardiology and he felt that the patient can stay in any [facility] and see cardiology in the morning.  Cardiology also thinks that this episode is most probably secondary to poorly controlled hypertension because patient misses home medications for a week.   Review of Systems: As per HPI otherwise 10 point review of systems negative.  Unacceptable ROS statements: "10 systems reviewed," "Extensive" (without elaboration).  Acceptable ROS statements: "All others negative," "All others reviewed and are negative," and "All others unremarkable," with at LEAST ONE ROS documented Can't double dip - if using for HPI can't use for ROS  Past Medical History:  Diagnosis Date  . Anxiety   . CAD (coronary artery disease)    DES to prox-mid LAD 2013  . Cholelithiasis   . Depression   . Deviated septum   . Diverticulosis   . Erectile dysfunction    Low serum testosterone level  . Essential hypertension   . Hemorrhoid   . Hyperlipidemia   . MI (myocardial infarction) (HCC)    Anterior STEMI 8/13  . Obesity   . Obstructive sleep apnea   . Tinnitus     Past Surgical History:  Procedure Laterality Date  . APPENDECTOMY    .  CARDIAC CATHETERIZATION  07/28/2012   20% distal left main, patent prox & mid LAD stents w/ 25% ISR of the latter (unchanged from 03/2012 cath, mid-LAD stent placed in 2003), 30-40% ostial LCx, 40-50% distal marginal, 30% distal serial RCA stenoses; LVEF 55% with apical HK  . COLONOSCOPY N/A 08/12/2013   Procedure: COLONOSCOPY;  Surgeon: Daneil Dolin, MD;  Location: [facility];  Service: Endoscopy;  Laterality: N/A;  10:15  . CORONARY ANGIOPLASTY WITH STENT PLACEMENT  2003    mid LAD  . CORONARY ANGIOPLASTY WITH STENT PLACEMENT  03/2012   STEMI s/p DES-prox LAD  . LEFT HEART CATHETERIZATION WITH CORONARY ANGIOGRAM N/A 04/10/2012   Procedure: LEFT HEART CATHETERIZATION WITH CORONARY ANGIOGRAM;  Surgeon: Burnell Blanks, MD;  Location: [facility] CATH LAB;  Service: Cardiovascular;  Laterality: N/A;  . LEFT HEART CATHETERIZATION WITH CORONARY ANGIOGRAM N/A 07/28/2012   Procedure: LEFT HEART CATHETERIZATION WITH CORONARY ANGIOGRAM;  Surgeon: Burnell Blanks, MD;  Location: [facility] CATH LAB;  Service: Cardiovascular;  Laterality: N/A;  . SPLENECTOMY    . TONSILLECTOMY       reports that he quit smoking about 24 years ago. His smoking use included cigarettes. He has a 15.00 pack-year smoking history. He quit smokeless tobacco use about 38 years ago. He reports current alcohol use of about 1.0 standard drinks of alcohol per week. He reports that he does not use drugs.  Allergies  Allergen Reactions  . Aspirin Anaphylaxis    Throat closes    Family History  Problem Relation Age of Onset  . Cancer Mother   . Arrhythmia Father     Unacceptable: Noncontributory, unremarkable, or negative. Acceptable: (example)Family history negative for heart disease  Prior to Admission medications   Medication Sig Start Date End Date Taking? Authorizing Provider  atorvastatin (LIPITOR) 80 MG tablet Take 1 tablet (80 mg total) by mouth every evening. 07/21/19  Yes Verta Ellen., NP  carvedilol (COREG) 6.25 MG tablet TAKE 1 TABLET TWICE A DAY WITH FOOD Patient taking differently: Take 6.25 mg by mouth 2 (two) times daily with a meal.  05/05/19  Yes Herminio Commons, MD  hydrochlorothiazide (HYDRODIURIL) 25 MG tablet Take 1 tablet (25 mg total) by mouth daily. 01/20/19  Yes Satira Sark, MD  isosorbide mononitrate (IMDUR) 30 MG 24 hr tablet Take 1 tablet (30 mg total) by mouth daily. 09/07/19  Yes Satira Sark, MD  lisinopril (ZESTRIL) 40 MG tablet Take 1 tablet  (40 mg total) by mouth daily. 01/20/19 01/13/20 Yes Satira Sark, MD  LORazepam (ATIVAN) 1 MG tablet Take 1 mg by mouth at bedtime.    Yes [provider]  Multiple Vitamins-Minerals (MEGA MULTI MEN PO) Take 1 tablet by mouth daily as needed (energy).   Yes [provider]  nitroGLYCERIN (NITROSTAT) 0.4 MG SL tablet Place 1 tablet (0.4 mg total) under the tongue every 5 (five) minutes as needed for chest pain. 07/21/19  Yes Verta Ellen., NP  prasugrel (EFFIENT) 10 MG TABS tablet TAKE 1 TABLET(10 MG) BY MOUTH DAILY Patient taking differently: Take 10 mg by mouth daily.  12/28/19  Yes Satira Sark, MD    Physical Exam: Vitals:   01/14/20 0130 01/14/20 0243 01/14/20 0244 01/14/20 0640  BP: (!) 144/73 124/77  108/70  Pulse: 78 87  86  Resp: 19 18  20   Temp:  99 F (37.2 C)  99.5 F (37.5 C)  TempSrc:  Oral  Oral  SpO2:  93% 97%  97%  Weight:      Height:   5\' 5"  (1.651 m)     Constitutional: NAD, calm, comfortable Vitals:   01/14/20 0130 01/14/20 0243 01/14/20 0244 01/14/20 0640  BP: (!) 144/73 124/77  108/70  Pulse: 78 87  86  Resp: 19 18  20   Temp:  99 F (37.2 C)  99.5 F (37.5 C)  TempSrc:  Oral  Oral  SpO2: 93% 97%  97%  Weight:      Height:   5\' 5"  (1.651 m)      Eyes: PERRL, lids and conjunctivae normal ENMT: Mucous membranes are moist. Posterior pharynx clear of any exudate or lesions.Normal dentition.  Neck: normal, supple, no masses, no thyromegaly Respiratory: clear to auscultation bilaterally, no wheezing, no crackles. Normal respiratory effort. No accessory muscle use.  Cardiovascular: Nontender on palpation.  Regular rate and rhythm, no murmurs / rubs / gallops. No extremity edema. 2+ pedal pulses. No carotid bruits.  Abdomen: no tenderness, no masses palpated. No hepatosplenomegaly. Bowel sounds positive.  Musculoskeletal: no clubbing / cyanosis. No joint deformity upper and lower extremities. Good ROM, no contractures. Normal  muscle tone.  Skin: no rashes, lesions, ulcers. No induration Neurologic: CN 2-12 grossly intact. Sensation intact, DTR normal. Strength 5/5 in all 4.  Psychiatric: Normal judgment and insight. Alert and oriented x 3. Normal mood.   (Anything < 9 systems with 2 bullets each down codes to level 1) (If patient refuses exam can't bill higher level) (Make sure to document decubitus ulcers present on admission -- if possible -- and whether patient has chronic indwelling catheter at time of admission)  Labs on Admission: I have personally reviewed following labs and imaging studies  CBC: Recent Labs  Lab 01/13/20 2055  WBC 9.1  HGB 12.1*  HCT 36.7*  MCV 96.1  PLT 315   Basic Metabolic Panel: Recent Labs  Lab 01/13/20 2055  NA 138  K 3.7  CL 103  CO2 27  GLUCOSE 104*  BUN 15  CREATININE 0.99  CALCIUM 8.8*   GFR: Estimated Creatinine Clearance: 65.3 mL/min (by C-G formula based on SCr of 0.99 mg/dL). Liver Function Tests: Recent Labs  Lab 01/13/20 2055  AST 16  ALT 18  ALKPHOS 57  BILITOT 0.7  PROT 7.0  ALBUMIN 3.3*   Recent Labs  Lab 01/13/20 2055  LIPASE 22   No results for input(s): AMMONIA in the last 168 hours. Coagulation Profile: No results for input(s): INR, PROTIME in the last 168 hours. Cardiac Enzymes: No results for input(s): CKTOTAL, CKMB, CKMBINDEX, TROPONINI in the last 168 hours. BNP (last 3 results) No results for input(s): PROBNP in the last 8760 hours. HbA1C: No results for input(s): HGBA1C in the last 72 hours. CBG: No results for input(s): GLUCAP in the last 168 hours. Lipid Profile: Recent Labs    01/13/20 2308  CHOL 180  HDL 41  LDLCALC 128*  TRIG 56  CHOLHDL 4.4   Thyroid Function Tests: No results for input(s): TSH, T4TOTAL, FREET4, T3FREE, THYROIDAB in the last 72 hours. Anemia Panel: No results for input(s): VITAMINB12, FOLATE, FERRITIN, TIBC, IRON, RETICCTPCT in the last 72 hours. Urine analysis:    Component Value  Date/Time   COLORURINE YELLOW 07/27/2012 0416   APPEARANCEUR CLEAR 07/27/2012 0416   LABSPEC 1.018 07/27/2012 0416   PHURINE 6.0 07/27/2012 0416   GLUCOSEU NEGATIVE 07/27/2012 0416   HGBUR NEGATIVE 07/27/2012 0416   BILIRUBINUR NEGATIVE 07/27/2012 0416   KETONESUR NEGATIVE  07/27/2012 0416   PROTEINUR NEGATIVE 07/27/2012 0416   UROBILINOGEN 0.2 07/27/2012 0416   NITRITE NEGATIVE 07/27/2012 0416   LEUKOCYTESUR NEGATIVE 07/27/2012 0416    Radiological Exams on Admission: DG Chest Portable 1 View  Result Date: 01/13/2020 CLINICAL DATA:  74 year old male with acute chest pain. Former smoker. EXAM: PORTABLE CHEST 1 VIEW COMPARISON:  Chest radiographs 07/03/2016 and earlier. FINDINGS: Portable AP upright view at 2058 hours. Lung volumes and mediastinal contours remain normal. Left coronary artery calcified plaque and/or stent. Visualized tracheal air column is within normal limits. No pneumothorax. Allowing for portable technique the lungs are clear. Negative visible bowel gas pattern. No acute osseous abnormality identified. IMPRESSION: Negative portable chest. Electronically Signed   By: Odessa Fleming M.D.   On: 01/13/2020 21:13      Assessment/Plan Principal Problem:   Chest pain Patient admitted with continuous telemetry monitoring for observation. Continue to trend the troponin. Cardiology consult ordered. Nitro glycine sublingual as needed for chest pain. Oxygen supplementation with nasal cannula as needed. N.p.o. midnight  Active Problems:   HTN (hypertension) Blood pressure on arrival to the hospital was 197/97 and patient was given lisinopril 40 mg in the ED.  Blood pressure at this time is 140/85. IV labetalol 10 mg every 6 hours as needed ordered if the systolic blood pressure goes above 180 or diastolic above 105. Medication reconciliation not done by the pharmacy and patient will be started on home medication after confirmation of medications list.    Hyperlipidemia Continue  home medication after formation  (please populate well all problems here in Problem List. (For example, if patient is on BP meds at home and you resume or decide to hold them, it is a problem that needs to be her. Same for CAD, COPD, HLD and so on)    DVT prophylaxis: Lovenox Code Status: Full code Family Communication: No family member at bedside  Disposition Plan:  Consults called: Cardiology Admission status: Observation/telemetry   Thalia Party MD [facility] Pager [phone]   If 7PM-7AM, please contact night-coverage [email or web address] Password [facility]  01/14/2020, 6:46 AM

## 2020-01-14 NOTE — Progress Notes (Signed)
CRITICAL VALUE ALERT  Critical Value: Troponin 161  Date & Time Notied:  01/14/2020 0835  Provider Notified: Dr. Arbutus Leas  Orders Received/Actions taken: 01/14/2020 7939

## 2020-01-14 NOTE — Progress Notes (Signed)
*  PRELIMINARY RESULTS* Echocardiogram 2D Echocardiogram has been performed.  Stacey Drain 01/14/2020, 1:56 PM

## 2020-01-14 NOTE — Consult Note (Addendum)
Cardiology Consultation:   Patient ID: Bradley Hurley MRN: [id number]; DOB: 12-29-1945  Admit date: 01/13/2020 Date of Consult: 01/14/2020  Primary Care Provider: Gareth Morgan, MD Primary Cardiologist: Nona Dell, MD  Primary Electrophysiologist:  None    Patient Profile:   Bradley Hurley is a 74 y.o. male with a hx of CAD (DES to LAD 2003, anterior MI s/p DES to prox-mid LAD in 2013), chronic angina, aspirin allergy, HTN, HLD, OSA, obesity, anxiety, depression, ED who is being seen today for the evaluation of chest pain and elevated troponin at the request of Dr. Arbutus Leas.  He presented with several week history of blood in stool and episode of chest discomfort last night.  History of Present Illness:   Bradley Hurley is followed by Dr. Diona Browner. He has history of stenting to the LAD in 2003. He had an anterior STEMI in 2013 with thrombectomy of the mid LAD and PTCA/DES x2 to the proximal and mid LAD.  otherwise showed diffuse small vessel disease in LAD and Circumflex. Last nuclear stress test in 12/2016 showed primarily fixed medium-sized basal to mid inferior and basal to mid inferoseptal perfusion defect, there does not appear to be significant ischemia, + possible prior infarction versus diaphragmatic attenuation, EF 52%, mild diffuse ischemia, overall low risk study - managed medically. He has history of chronic angina with higher levels of exertion but not ADLs.  Bradley Hurley has noticed a generalized lack of appetite for 3-4 years time and attributes this to why he lost about 30lb in that span of time. About 4 weeks ago he noticed a centralized burning sensation across his stomach. He thought he might be getting an ulcer so over the next few weeks began dabbling in self-adjusting and self-discontinuing all his medications. He would add some back then stop them again to see what helped but nothing did. 2 weeks ago he began noticing pink blood upon wiping. This then progressed to loose stools  with small blood clots at times. He assumed this was due to his Effient so he also stopped this. He did not consult with a physician until Tuesday 5/18 when he saw his family doctor who facilitated ASAP referral to GI that was pending for tomorrow. However, last night the patient developed an episode of chest discomfort in the context of elevated blood pressure. He had eaten a hot dog and then while at rest some time after developed "indigestion" in his chest with radiation to his neck/jaw similar to prior angina. No nausea, vomiting, diaphoresis or dyspnea. He took 1 SL NTG and 1 Effient with mild improvement. Due to lack of complete resolution he called EMS. He had gradual full resolution of discomfort after the SL NTG although discomfort waxed/waned for about an hour after that. Initial BP in the ED was 197/97. This morning he feels totally fine. He does not take any NSAIDS because he states he's otherwise allergic - takes Tylenol only. Workup reveals hsTroponin 12->66->161, Hgb 12.1 (previous value 11.6 in 06/2019), albumin 3.3, LDL 128, Covid test negative. NSR 73bpm, lower voltage precordial leads, baseline wander, nonspecific swooped appearance of ST segment in III, avF, no acute change from prior.   GI consult pending. He also c/o sinus issues so IM is obtaining CT maxillofacial.    Past Medical History:  Diagnosis Date  . Anxiety   . CAD (coronary artery disease)    DES to prox-mid LAD 2013  . Cholelithiasis   . Depression   . Deviated septum   .  Diverticulosis   . Erectile dysfunction    Low serum testosterone level  . Essential hypertension   . Hemorrhoid   . Hyperlipidemia   . MI (myocardial infarction) (HCC)    Anterior STEMI 8/13  . Obesity   . Obstructive sleep apnea   . Tinnitus     Past Surgical History:  Procedure Laterality Date  . APPENDECTOMY    . CARDIAC CATHETERIZATION  07/28/2012   20% distal left main, patent prox & mid LAD stents w/ 25% ISR of the latter  (unchanged from 03/2012 cath, mid-LAD stent placed in 2003), 30-40% ostial LCx, 40-50% distal marginal, 30% distal serial RCA stenoses; LVEF 55% with apical HK  . COLONOSCOPY N/A 08/12/2013   rourk: anal canal hemorrhoids (minimal), abnormal sigmoid colon with diverticula and mucosal edema and submucosal petechia (bx showed Mild changes of diverticular colitis or diverticulitis. next tcs in 10 years.   . CORONARY ANGIOPLASTY WITH STENT PLACEMENT  2003   mid LAD  . CORONARY ANGIOPLASTY WITH STENT PLACEMENT  03/2012   STEMI s/p DES-prox LAD  . LEFT HEART CATHETERIZATION WITH CORONARY ANGIOGRAM N/A 04/10/2012   Procedure: LEFT HEART CATHETERIZATION WITH CORONARY ANGIOGRAM;  Surgeon: Kathleene Hazel, MD;  Location: [facility] CATH LAB;  Service: Cardiovascular;  Laterality: N/A;  . LEFT HEART CATHETERIZATION WITH CORONARY ANGIOGRAM N/A 07/28/2012   Procedure: LEFT HEART CATHETERIZATION WITH CORONARY ANGIOGRAM;  Surgeon: Kathleene Hazel, MD;  Location: [facility] CATH LAB;  Service: Cardiovascular;  Laterality: N/A;  . SPLENECTOMY    . TONSILLECTOMY       Home Medications:  Prior to Admission medications   Medication Sig Start Date End Date Taking? Authorizing Provider  atorvastatin (LIPITOR) 80 MG tablet Take 1 tablet (80 mg total) by mouth every evening. 07/21/19  Yes Netta Neat., NP  carvedilol (COREG) 6.25 MG tablet TAKE 1 TABLET TWICE A DAY WITH FOOD Patient taking differently: Take 6.25 mg by mouth 2 (two) times daily with a meal.  05/05/19  Yes Laqueta Linden, MD  hydrochlorothiazide (HYDRODIURIL) 25 MG tablet Take 1 tablet (25 mg total) by mouth daily. 01/20/19  Yes Jonelle Sidle, MD  isosorbide mononitrate (IMDUR) 30 MG 24 hr tablet Take 1 tablet (30 mg total) by mouth daily. 09/07/19  Yes Jonelle Sidle, MD  lisinopril (ZESTRIL) 40 MG tablet Take 1 tablet (40 mg total) by mouth daily. 01/20/19 01/13/20 Yes Jonelle Sidle, MD  LORazepam (ATIVAN) 1 MG tablet Take 1 mg by  mouth at bedtime.    Yes [provider]  Multiple Vitamins-Minerals (MEGA MULTI MEN PO) Take 1 tablet by mouth daily as needed (energy).   Yes [provider]  nitroGLYCERIN (NITROSTAT) 0.4 MG SL tablet Place 1 tablet (0.4 mg total) under the tongue every 5 (five) minutes as needed for chest pain. 07/21/19  Yes Netta Neat., NP  prasugrel (EFFIENT) 10 MG TABS tablet TAKE 1 TABLET(10 MG) BY MOUTH DAILY Patient taking differently: Take 10 mg by mouth daily.  12/28/19  Yes Jonelle Sidle, MD    Inpatient Medications: Scheduled Meds: . carvedilol  6.25 mg Oral BID WC  . isosorbide mononitrate  30 mg Oral Daily   Continuous Infusions: . sodium chloride 125 mL/hr at 01/14/20 0328   PRN Meds: acetaminophen, labetalol, nitroGLYCERIN, ondansetron (ZOFRAN) IV  Allergies:    Allergies  Allergen Reactions  . Aspirin Anaphylaxis    Throat closes  . Nsaids     Social History:   Social  History   Socioeconomic History  . Marital status: Married    Spouse name: Not on file  . Number of children: Not on file  . Years of education: Not on file  . Highest education level: Not on file  Occupational History  . Occupation: Probation officer    Comment: self-employed  Tobacco Use  . Smoking status: Former Smoker    Packs/day: 1.00    Years: 15.00    Pack years: 15.00    Types: Cigarettes    Quit date: 09/03/1995    Years since quitting: 24.3  . Smokeless tobacco: Former Neurosurgeon    Quit date: 01/30/1981  . Tobacco comment: Quit in 1982  Substance and Sexual Activity  . Alcohol use: Yes    Alcohol/week: 1.0 standard drinks    Types: 1 Standard drinks or equivalent per week    Comment: occ  . Drug use: No  . Sexual activity: Yes  Other Topics Concern  . 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   Social Determinants of Health   Financial Resource Strain:   . Difficulty of Paying Living Expenses:   Food Insecurity:   .  Worried About Programme researcher, broadcasting/film/video in the Last Year:   . Barista in the Last Year:   Transportation Needs:   . Freight forwarder (Medical):   Marland Kitchen Lack of Transportation (Non-Medical):   Physical Activity:   . Days of Exercise per Week:   . Minutes of Exercise per Session:   Stress:   . Feeling of Stress :   Social Connections:   . Frequency of Communication with Friends and Family:   . Frequency of Social Gatherings with Friends and Family:   . Attends Religious Services:   . Active Member of Clubs or Organizations:   . Attends Banker Meetings:   Marland Kitchen Marital Status:   Intimate Partner Violence:   . Fear of Current or Ex-Partner:   . Emotionally Abused:   Marland Kitchen Physically Abused:   . Sexually Abused:     Family History:    Family History  Problem Relation Age of Onset  . Cancer Mother   . Arrhythmia Father      ROS:  Please see the history of present illness.   All other ROS reviewed and negative.     Physical Exam/Data:   Vitals:   01/14/20 0243 01/14/20 0244 01/14/20 0640 01/14/20 0820  BP: 124/77  108/70 139/79  Pulse: 87  86 74  Resp: Temp: 99 F (37.2 C)  99.5 F (37.5 C)   TempSrc: Oral  Oral   SpO2: 97%  97% 99%  Weight:      Height:   (1.651 m)      Intake/Output Summary (Last 24 hours) at 01/14/2020 0953 Last data filed at 01/14/2020 0900 Gross per 24 hour  Intake 779.17 ml  Output --  Net 779.17 ml   Last 3 Weights 01/13/2020 07/21/2019 01/20/2019  Weight (lbs) 180 lb 181 lb 182 lb  Weight (kg) 81.647 kg 82.101 kg 82.555 kg     Body mass index is 29.95 kg/m.  General: Well developed, well nourished obese WM in no acute distress. Head: Normocephalic, atraumatic, sclera non-icteric, no xanthomas, nares are without discharge. Neck: Negative for carotid bruits. JVP not elevated. Lungs: Clear bilaterally to auscultation without wheezes, rales, or rhonchi. Breathing is unlabored. Heart: RRR S1 S2 without  murmurs,  rubs, or gallops.  Abdomen: Soft, non-tender, non-distended with normoactive bowel sounds. No rebound/guarding. Extremities: No clubbing or cyanosis. No edema. Distal pedal pulses are 2+ and equal bilaterally. Neuro: Alert and oriented X 3. Moves all extremities spontaneously. Psych:  Responds to questions appropriately with a normal affect.   EKG:  The EKG was personally reviewed and demonstrates: NSR 73bpm, lower voltage precordial leads, baseline wander, nonspecific swooped appearance of ST segment in III, avF, no acute change from prior. Telemetry:  Telemetry was personally reviewed and demonstrates: NSR with brief run of SVT (appears initiated by ectopic atrial beat)  Relevant CV Studies: 2D Echo 2016 Study Conclusions   - Left ventricle: The cavity size was normal. Wall thickness was  increased in a pattern of mild LVH. Systolic function was normal.  The estimated ejection fraction was in the range of 60% to 65%.  Wall motion was normal; there were no regional wall motion  abnormalities. Doppler parameters are consistent with abnormal  left ventricular relaxation (grade 1 diastolic dysfunction).  - Aortic valve: Mildly calcified annulus. Trileaflet; mildly  thickened leaflets.  - Mitral valve: Mildly thickened leaflets . There was mild  regurgitation.  - Left atrium: The atrium was mildly dilated.  - Right atrium: Central venous pressure (est): 3 mm Hg.  - Tricuspid valve: There was trivial regurgitation.  - Pulmonary arteries: Systolic pressure could not be accurately  estimated.  - Pericardium, extracardiac: There was no pericardial effusion.   Impressions:   - Mild LVH with LVEF 60-65%, grade 1 diastolic dysfunction. Mild  left atrial enlargement. Mildly thickened mitral leaflets with  mild mitral regurgitation. Mildly sclerotic aortic valve. Unable  to assess PASP.   NST 2018  There was no ST segment deviation noted during stress.  This is a  low risk study.  The left ventricular ejection fraction is mildly decreased (45-54%).   1. EF 52%, mild diffuse hypokinesis.  2. Primarily fixed medium-sized basal to mid inferior and basal to mid inferoseptal perfusion defect.  There does not appear to be significant ischemia.  Possible prior infarction versus diaphragmatic attenuation.   Given lack of ischemia, overall low risk study  Laboratory Data:  High Sensitivity Troponin:   Recent Labs  Lab 01/13/20 2055 01/13/20 2308 01/14/20 0709  TROPONINIHS 12 66* 161*     Chemistry Recent Labs  Lab 01/13/20 2055  NA 138  K 3.7  CL 103  CO2 27  GLUCOSE 104*  BUN 15  CREATININE 0.99  CALCIUM 8.8*  GFRNONAA >60  GFRAA >60  ANIONGAP 8    Recent Labs  Lab 01/13/20 2055  PROT 7.0  ALBUMIN 3.3*  AST 16  ALT 18  ALKPHOS 57  BILITOT 0.7   Hematology Recent Labs  Lab 01/13/20 2055  WBC 9.1  RBC 3.82*  HGB 12.1*  HCT 36.7*  MCV 96.1  MCH 31.7  MCHC 33.0  RDW 14.9  PLT 315   BNPNo results for input(s): BNP, PROBNP in the last 168 hours.  DDimer No results for input(s): DDIMER in the last 168 hours.   Radiology/Studies:  DG Chest Portable 1 View  Result Date: 01/13/2020 CLINICAL DATA:  74 year old male with acute chest pain. Former smoker. EXAM: PORTABLE CHEST 1 VIEW COMPARISON:  Chest radiographs 07/03/2016 and earlier. FINDINGS: Portable AP upright view at 2058 hours. Lung volumes and mediastinal contours remain normal. Left coronary artery calcified plaque and/or stent. Visualized tracheal air column is within normal limits. No pneumothorax. Allowing for portable technique  the lungs are clear. Negative visible bowel gas pattern. No acute osseous abnormality identified. IMPRESSION: Negative portable chest. Electronically Signed   By: Genevie Ann M.D.   On: 01/13/2020 21:13       TIMI Risk Score for Unstable Angina or Non-ST Elevation MI:   The patient's TIMI risk score is 4, which indicates a 20% risk of all  cause mortality, new or recurrent myocardial infarction or need for urgent revascularization in the next 14 days.   Assessment and Plan:   1. Chest pain with elevated troponin and known history of CAD - given patient's known anatomy and chronic underlying angina, suspect this is related to being out of cardiac medications and elevated blood pressure. Tricky situation given recent GI bleeding - this requires further workup this admission. Effient remains on hold pending GI recs. Agree with resumption of carvedilol and Imdur. Also discussed with patient the importance of not self-discontinuing medications or trying to work through concerning symptoms at home without involving his medical team. Will review further recs with MD.  2. Hematochezia - ongoing for several weeks. This was preceded by abdominal pain that started 4 weeks ago described as burning. He also describes decreasing appetite for 3-4 years with gradual 30lb weight loss. Needs further GI w/u this admission. Hgb fortunately remains relatively stable.  3. Essential HTN, poorly controlled on arrival - carvedilol and Imdur resumed. He was also on HCTZ and lisinopril which can be gradually added back if needed.  4. Hyperlipidemia - LDL above goal in setting of not taking medication. Resume home statin. If the patient is tolerating statin at time of follow-up appointment, would consider rechecking liver function/lipid panel in 6-8 weeks.   5. Brief run of SVT on telemetry - beta blocker has been resumed, will follow on telemetry. K 3.7 - will give 72meq KCl now. Add Mg level. F/u lytes in AM.  For questions or updates, please contact [place] Please consult [email or web address] for contact info under     Signed, Charlie Pitter, PA-C  01/14/2020 9:53 AM  Attending note  Patient seen and discussed with PA Dunn, I agree with her documentation. History of CAD with prior DES to LAD in 2013 in setting of anterior STEMI,   Hyperilipidemia, HTN.  Has ASA allergy, has been on long term effient. Admitted with chest pain, hematochezia.  To me he reports isolated episode of mild left sided chest pain radiating to left neck. Wasn't sure if it was angina or indigestion, he had eaten just prior. No recurrence of pain. He reports he had been noncompliant with his meds at home, thought some were causing abdominal pain or perhaps adding to some recent bleeding in his BMs.    ER vitals 197/97 p 71 96% RA hstrop 12-->66-->161--> WBC 9.1 Hgb 12.1 Plt 315 K 3.7 Cr 0.99 Lipase 22 LDL 128  COVID neg CXR no acute process EKG SR, no specific ischemic changes   Isolated episode of mild chest pain and mild trop in setting of significant HTN on admission due to lack of medication compliance. Cardiac management complicated by hematochezia and GI symptoms, undergoing inpatient GI evaluation. F/u trop trend and echo, in absence of high risk findings unlikely to consider extensive cardiac workup given his GI bleeding issues, and management likely to be just medical. He has had some chronic angina over the years and self discontinued his home medication regimen. Likely medical management with resumption of his home regimen, if significant recurrence of symptoms pending his  GI eval could consider ischemic testing at that time. Continue to hold effient given GI bleeding.    Dominga FerryJ Jana Swartzlander MD

## 2020-01-14 NOTE — H&P (View-Only) (Signed)
Referring Provider: Orson Eva, MD Primary Care Physician:  Lemmie Evens, MD Primary Gastroenterologist:  Garfield Cornea, MD  Reason for Consultation:  hematochezia  HPI: Bradley Hurley is a 74 y.o. male with history of hypertension, hyperlipidemia, CAD with remote stents on Effient, obstructive sleep apnea presenting to the emergency department with chest discomfort which began yesterday evening after eating a hot dog.    Patient states he has been having abdominal burning for several weeks.  Discomfort predominantly in the lower abdomen.  He has had a lot of gas and flatulence.  Describes intermittent diarrhea, usually no more than 4-5 stools per day.  Often would have urge to have a bowel movement but then would pass mostly gas.  Denies any melena.  He worried about possibly having an ulcer.  Because of this he decided to stop his medications about a week ago.  He has been having some intermittent blood per rectum for 2 to 3 weeks.  Initially started out as pink-tinged stool.  Progressed to passing multiple small blood clots.  States he never felt concerned that it was a significant amount of blood. States he has had some minor irritation around the anal opening.  He has history of anal canal hemorrhoids and diverticulosis on colonoscopy in 2014.  No bowel movement in 2 days. He decided to stop his Effient about a week ago because of this.  Denies melena. Denies NSAIDs.  He is allergic to aspirin.  Denies heartburn.  He took 1 antibiotic pill that his wife had about 3 days ago for sinus infection.    In the ED his blood pressure was 197/97, pulse 69.  Patient's initial troponin I was 12.  Hemoglobin 12.1, hematocrit 36.7.  White blood cell count and platelets normal.  Creatinine 0.99.  LFTs normal except for albumin of 3.3.  Lipase 22.  Second troponin I was 66.  Third troponin was 161.  Covid negative.  Chest x-ray negative.  Patient denies chest pain this morning.  He has seen cardiology.  Right  now no plans for ischemic testing, it is felt like his elevated troponin and chest discomfort were related to him discontinuing his home medications.  Effient remains on hold.  Prior to Admission medications   Medication Sig Start Date End Date Taking? Authorizing Provider  atorvastatin (LIPITOR) 80 MG tablet Take 1 tablet (80 mg total) by mouth every evening. 07/21/19  Yes Verta Ellen., NP  carvedilol (COREG) 6.25 MG tablet TAKE 1 TABLET TWICE A DAY WITH FOOD Patient taking differently: Take 6.25 mg by mouth 2 (two) times daily with a meal.  05/05/19  Yes Herminio Commons, MD  hydrochlorothiazide (HYDRODIURIL) 25 MG tablet Take 1 tablet (25 mg total) by mouth daily. 01/20/19  Yes Satira Sark, MD  isosorbide mononitrate (IMDUR) 30 MG 24 hr tablet Take 1 tablet (30 mg total) by mouth daily. 09/07/19  Yes Satira Sark, MD  lisinopril (ZESTRIL) 40 MG tablet Take 1 tablet (40 mg total) by mouth daily. 01/20/19 01/13/20 Yes Satira Sark, MD  LORazepam (ATIVAN) 1 MG tablet Take 1 mg by mouth at bedtime.    Yes [provider]  Multiple Vitamins-Minerals (MEGA MULTI MEN PO) Take 1 tablet by mouth daily as needed (energy).   Yes [provider]  nitroGLYCERIN (NITROSTAT) 0.4 MG SL tablet Place 1 tablet (0.4 mg total) under the tongue every 5 (five) minutes as needed for chest pain. 07/21/19  Yes Verta Ellen., NP  prasugrel (EFFIENT) 10 MG TABS tablet TAKE 1 TABLET(10 MG) BY MOUTH DAILY Patient taking differently: Take 10 mg by mouth daily.  12/28/19  Yes Jonelle Sidle, MD    Current Facility-Administered Medications  Medication Dose Route Frequency Provider Last Rate Last Admin  . 0.9 %  sodium chloride infusion   Intravenous Continuous Linwood Dibbles, MD 125 mL/hr at 01/14/20 0328 New Bag at 01/14/20 0328  . acetaminophen (TYLENOL) tablet 650 mg  650 mg Oral Q4H PRN Renda Rolls Z, DO      . carvedilol (COREG) tablet 6.25 mg  6.25 mg Oral BID WC Catarina Hartshorn, MD   6.25 mg at 01/14/20 0820  . isosorbide mononitrate (IMDUR) 24 hr tablet 30 mg  30 mg Oral Daily Tat, David, MD      . labetalol (NORMODYNE) injection 10 mg  10 mg Intravenous Q6H PRN Renda Rolls Z, DO      . nitroGLYCERIN (NITROSTAT) SL tablet 0.4 mg  0.4 mg Sublingual Q5 min PRN Renda Rolls Z, DO      . ondansetron [facility]) injection 4 mg  4 mg Intravenous Q6H PRN Renda Rolls Z, DO        Allergies as of 01/13/2020 - Review Complete 01/13/2020  Allergen Reaction Noted  . Aspirin Anaphylaxis 04/09/2012    Past Medical History:  Diagnosis Date  . Anxiety   . CAD (coronary artery disease)    DES to prox-mid LAD 2013  . Cholelithiasis   . Depression   . Deviated septum   . Diverticulosis   . Erectile dysfunction    Low serum testosterone level  . Essential hypertension   . Hemorrhoid   . Hyperlipidemia   . MI (myocardial infarction) (HCC)    Anterior STEMI 8/13  . Obesity   . Obstructive sleep apnea   . Tinnitus     Past Surgical History:  Procedure Laterality Date  . APPENDECTOMY    . CARDIAC CATHETERIZATION  07/28/2012   20% distal left main, patent prox & mid LAD stents w/ 25% ISR of the latter (unchanged from 03/2012 cath, mid-LAD stent placed in 2003), 30-40% ostial LCx, 40-50% distal marginal, 30% distal serial RCA stenoses; LVEF 55% with apical HK  . COLONOSCOPY N/A 08/12/2013   rourk: anal canal hemorrhoids (minimal), abnormal sigmoid colon with diverticula and mucosal edema and submucosal petechia (bx showed Mild changes of diverticular colitis or diverticulitis. next tcs in 10 years.   . CORONARY ANGIOPLASTY WITH STENT PLACEMENT  2003   mid LAD  . CORONARY ANGIOPLASTY WITH STENT PLACEMENT  03/2012   STEMI s/p DES-prox LAD  . LEFT HEART CATHETERIZATION WITH CORONARY ANGIOGRAM N/A 04/10/2012   Procedure: LEFT HEART CATHETERIZATION WITH CORONARY ANGIOGRAM;  Surgeon: Kathleene Hazel, MD;  Location: [facility] CATH LAB;  Service: Cardiovascular;   Laterality: N/A;  . LEFT HEART CATHETERIZATION WITH CORONARY ANGIOGRAM N/A 07/28/2012   Procedure: LEFT HEART CATHETERIZATION WITH CORONARY ANGIOGRAM;  Surgeon: Kathleene Hazel, MD;  Location: [facility] CATH LAB;  Service: Cardiovascular;  Laterality: N/A;  . 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 TONSILLECTOMY      Family History  Problem Relation Age of Onset  . Cancer Mother   . Arrhythmia Father     Social History   Socioeconomic History  . Marital status: Married    Spouse name: Not on file  . Number of children: Not on file  . Years of education: Not on file  . Highest  education level: Not on file  Occupational History  . Occupation: Probation officer    Comment: self-employed  Tobacco Use  . Smoking status: Former Smoker    Packs/day: 1.00    Years: 15.00    Pack years: 15.00    Types: Cigarettes    Quit date: 09/03/1995    Years since quitting: 24.3  . Smokeless tobacco: Former Neurosurgeon    Quit date: 01/30/1981  . Tobacco comment: Quit in 1982  Substance and Sexual Activity  . Alcohol use: Yes    Alcohol/week: 1.0 standard drinks    Types: 1 Standard drinks or equivalent per week    Comment: occ  . Drug use: No  . Sexual activity: Yes  Other Topics Concern  . 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   Social Determinants of Health   Financial Resource Strain:   . Difficulty of Paying Living Expenses:   Food Insecurity:   . Worried About Programme researcher, broadcasting/film/video in the Last Year:   . Barista in the Last Year:   Transportation Needs:   . Freight forwarder (Medical):   Marland Kitchen Lack of Transportation (Non-Medical):   Physical Activity:   . Days of Exercise per Week:   . Minutes of Exercise per Session:   Stress:   . Feeling of Stress :   Social Connections:   . Frequency of Communication with Friends and Family:   . Frequency of Social Gatherings with Friends and Family:   . Attends Religious Services:   .  Active Member of Clubs or Organizations:   . Attends Banker Meetings:   Marland Kitchen Marital Status:   Intimate Partner Violence:   . Fear of Current or Ex-Partner:   . Emotionally Abused:   Marland Kitchen Physically Abused:   . Sexually Abused:      ROS:  General: Negative for anorexia, weight loss, fever, chills, fatigue, weakness. Eyes: Negative for vision changes.  ENT: Negative for hoarseness, difficulty swallowing , nasal congestion. CV: Negative for chest pain, angina, palpitations, dyspnea on exertion, peripheral edema.  See HPI Respiratory: Negative for dyspnea at rest, dyspnea on exertion, cough, sputum, wheezing.  GI: See history of present illness. GU:  Negative for dysuria, hematuria, urinary incontinence, urinary frequency, nocturnal urination.  MS: Negative for joint pain, low back pain.  Derm: Negative for rash or itching.  Neuro: Negative for weakness, abnormal sensation, seizure, frequent headaches, memory loss, confusion.  Psych: Negative for anxiety, depression, suicidal ideation, hallucinations.  Endo: Negative for unusual weight change.  Heme: Negative for bruising or bleeding. Allergy: Negative for rash or hives.       Physical Examination: Vital signs in last 24 hours: Temp:  [98.5 F (36.9 C)-99.5 F (37.5 C)] 99.5 F (37.5 C) (05/20 0640) Pulse Rate:  [64-87] 74 (05/20 0820) Resp:  [14-24] 20 (05/20 0820) BP: (108-197)/(70-98) 139/79 (05/20 0820) SpO2:  [90 %-99 %] 99 % (05/20 0820) Weight:  [81.6 kg] 81.6 kg (05/19 1948) Last BM Date: 01/14/20  General: Well-nourished, well-developed in no acute distress.  Head: Normocephalic, atraumatic.   Eyes: Conjunctiva pink, no icterus. Mouth: Oropharyngeal mucosa moist and pink , no lesions erythema or exudate. Neck: Supple without thyromegaly, masses, or lymphadenopathy.  Lungs: Clear to auscultation bilaterally.  Heart: Regular rate and rhythm, no murmurs rubs or gallops.  Abdomen: Bowel sounds are normal,    nondistended, no hepatosplenomegaly or masses, no abdominal bruits or  hernia , no rebound or guarding.  Mild rlq tenderness Rectal: not performed Extremities: No lower extremity edema, clubbing, deformity.  Neuro: Alert and oriented x 4 , grossly normal neurologically.  Skin: Warm and dry, no rash or jaundice.   Psych: Alert and cooperative, normal mood and affect.        Intake/Output from previous day: 05/19 0701 - 05/20 0700 In: 779.2 [I.V.:779.2] Out: -  Intake/Output this shift: No intake/output data recorded.  Lab Results: CBC Recent Labs    01/13/20 2055  WBC 9.1  HGB 12.1*  HCT 36.7*  MCV 96.1  PLT 315   BMET Recent Labs    01/13/20 2055  NA 138  K 3.7  CL 103  CO2 27  GLUCOSE 104*  BUN 15  CREATININE 0.99  CALCIUM 8.8*   LFT Recent Labs    01/13/20 2055  BILITOT 0.7  ALKPHOS 57  AST 16  ALT 18  PROT 7.0  ALBUMIN 3.3*    Lipase Recent Labs    01/13/20 2055  LIPASE 22    PT/INR No results for input(s): LABPROT, INR in the last 72 hours.    Imaging Studies: DG Chest Portable 1 View  Result Date: 01/13/2020 CLINICAL DATA:  74 year old male with acute chest pain. Former smoker. EXAM: PORTABLE CHEST 1 VIEW COMPARISON:  Chest radiographs 07/03/2016 and earlier. FINDINGS: Portable AP upright view at 2058 hours. Lung volumes and mediastinal contours remain normal. Left coronary artery calcified plaque and/or stent. Visualized tracheal air column is within normal limits. No pneumothorax. Allowing for portable technique the lungs are clear. Negative visible bowel gas pattern. No acute osseous abnormality identified. IMPRESSION: Negative portable chest. Electronically Signed   By: Odessa Fleming M.D.   On: 01/13/2020 21:13  [4 week]   Impression: 74 y/o male with hypertension, CAD with remote stents on Effient, obstructive sleep apnea, hyperlipidemia presenting to the ED with complaints of chest pain.  Over the past several weeks he has had lower  abdominal discomfort/burning and decided to stop his medications for concern for possible ulcer.  He stopped Effient about a week ago when he started having bright red blood per rectum.  Complains of intermittent loose stools.  Rectal bleeding: Initially started out as bright red followed by a small blood clots.  Patient states he stopped Effient when symptoms started 1 week ago.  No bleeding in the past 2 days.  States he feels like it was a small amount of bleeding.  No melena.  History of anal canal hemorrhoids and diverticula on colonoscopy in 2014.  He has some vague intermittent loose stools with more recent concerns regarding excessive gas and flatulence.  Denies any significant recent antibiotic use.  Mild anemia noted back in November with hemoglobin 11.6.  Hemoglobin last night was 12.1.  Given some alteration of bowel function, would be concerned for possible underlying colitis, inflammtory versus infectious, less likely malignancy.  Would not be able to explain change in bowel based on hemorrhoid bleeding alone.  Plan: 1. Consider inpatient colonoscopy. 2. Monitor for significant bleeding.   We would like to thank you for the opportunity to participate in the care of NAWAF STRANGE.  Leanna Battles. [facility] [phone] 5/20/20211:53 PM    LOS: 0 days

## 2020-01-14 NOTE — Consult Note (Signed)
Referring Provider: Orson Eva, MD Primary Care Physician:  Lemmie Evens, MD Primary Gastroenterologist:  Garfield Cornea, MD  Reason for Consultation:  hematochezia  HPI: Bradley Hurley is a 74 y.o. male with history of hypertension, hyperlipidemia, CAD with remote stents on Effient, obstructive sleep apnea presenting to the emergency department with chest discomfort which began yesterday evening after eating a hot dog.    Patient states he has been having abdominal burning for several weeks.  Discomfort predominantly in the lower abdomen.  He has had a lot of gas and flatulence.  Describes intermittent diarrhea, usually no more than 4-5 stools per day.  Often would have urge to have a bowel movement but then would pass mostly gas.  Denies any melena.  He worried about possibly having an ulcer.  Because of this he decided to stop his medications about a week ago.  He has been having some intermittent blood per rectum for 2 to 3 weeks.  Initially started out as pink-tinged stool.  Progressed to passing multiple small blood clots.  States he never felt concerned that it was a significant amount of blood. States he has had some minor irritation around the anal opening.  He has history of anal canal hemorrhoids and diverticulosis on colonoscopy in 2014.  No bowel movement in 2 days. He decided to stop his Effient about a week ago because of this.  Denies melena. Denies NSAIDs.  He is allergic to aspirin.  Denies heartburn.  He took 1 antibiotic pill that his wife had about 3 days ago for sinus infection.    In the ED his blood pressure was 197/97, pulse 69.  Patient's initial troponin I was 12.  Hemoglobin 12.1, hematocrit 36.7.  White blood cell count and platelets normal.  Creatinine 0.99.  LFTs normal except for albumin of 3.3.  Lipase 22.  Second troponin I was 66.  Third troponin was 161.  Covid negative.  Chest x-ray negative.  Patient denies chest pain this morning.  He has seen cardiology.  Right  now no plans for ischemic testing, it is felt like his elevated troponin and chest discomfort were related to him discontinuing his home medications.  Effient remains on hold.  Prior to Admission medications   Medication Sig Start Date End Date Taking? Authorizing Provider  atorvastatin (LIPITOR) 80 MG tablet Take 1 tablet (80 mg total) by mouth every evening. 07/21/19  Yes Verta Ellen., NP  carvedilol (COREG) 6.25 MG tablet TAKE 1 TABLET TWICE A DAY WITH FOOD Patient taking differently: Take 6.25 mg by mouth 2 (two) times daily with a meal.  05/05/19  Yes Herminio Commons, MD  hydrochlorothiazide (HYDRODIURIL) 25 MG tablet Take 1 tablet (25 mg total) by mouth daily. 01/20/19  Yes Satira Sark, MD  isosorbide mononitrate (IMDUR) 30 MG 24 hr tablet Take 1 tablet (30 mg total) by mouth daily. 09/07/19  Yes Satira Sark, MD  lisinopril (ZESTRIL) 40 MG tablet Take 1 tablet (40 mg total) by mouth daily. 01/20/19 01/13/20 Yes Satira Sark, MD  LORazepam (ATIVAN) 1 MG tablet Take 1 mg by mouth at bedtime.    Yes [provider]  Multiple Vitamins-Minerals (MEGA MULTI MEN PO) Take 1 tablet by mouth daily as needed (energy).   Yes [provider]  nitroGLYCERIN (NITROSTAT) 0.4 MG SL tablet Place 1 tablet (0.4 mg total) under the tongue every 5 (five) minutes as needed for chest pain. 07/21/19  Yes Verta Ellen., NP  prasugrel (EFFIENT) 10 MG TABS tablet TAKE 1 TABLET(10 MG) BY MOUTH DAILY Patient taking differently: Take 10 mg by mouth daily.  12/28/19  Yes Jonelle Sidle, MD    Current Facility-Administered Medications  Medication Dose Route Frequency Provider Last Rate Last Admin  . 0.9 %  sodium chloride infusion   Intravenous Continuous Linwood Dibbles, MD 125 mL/hr at 01/14/20 0328 New Bag at 01/14/20 0328  . acetaminophen (TYLENOL) tablet 650 mg  650 mg Oral Q4H PRN Renda Rolls Z, DO      . carvedilol (COREG) tablet 6.25 mg  6.25 mg Oral BID WC Catarina Hartshorn, MD   6.25 mg at 01/14/20 0820  . isosorbide mononitrate (IMDUR) 24 hr tablet 30 mg  30 mg Oral Daily Tat, David, MD      . labetalol (NORMODYNE) injection 10 mg  10 mg Intravenous Q6H PRN Renda Rolls Z, DO      . nitroGLYCERIN (NITROSTAT) SL tablet 0.4 mg  0.4 mg Sublingual Q5 min PRN Renda Rolls Z, DO      . ondansetron [facility]) injection 4 mg  4 mg Intravenous Q6H PRN Renda Rolls Z, DO        Allergies as of 01/13/2020 - Review Complete 01/13/2020  Allergen Reaction Noted  . Aspirin Anaphylaxis 04/09/2012    Past Medical History:  Diagnosis Date  . Anxiety   . CAD (coronary artery disease)    DES to prox-mid LAD 2013  . Cholelithiasis   . Depression   . Deviated septum   . Diverticulosis   . Erectile dysfunction    Low serum testosterone level  . Essential hypertension   . Hemorrhoid   . Hyperlipidemia   . MI (myocardial infarction) (HCC)    Anterior STEMI 8/13  . Obesity   . Obstructive sleep apnea   . Tinnitus     Past Surgical History:  Procedure Laterality Date  . APPENDECTOMY    . CARDIAC CATHETERIZATION  07/28/2012   20% distal left main, patent prox & mid LAD stents w/ 25% ISR of the latter (unchanged from 03/2012 cath, mid-LAD stent placed in 2003), 30-40% ostial LCx, 40-50% distal marginal, 30% distal serial RCA stenoses; LVEF 55% with apical HK  . COLONOSCOPY N/A 08/12/2013   rourk: anal canal hemorrhoids (minimal), abnormal sigmoid colon with diverticula and mucosal edema and submucosal petechia (bx showed Mild changes of diverticular colitis or diverticulitis. next tcs in 10 years.   . CORONARY ANGIOPLASTY WITH STENT PLACEMENT  2003   mid LAD  . CORONARY ANGIOPLASTY WITH STENT PLACEMENT  03/2012   STEMI s/p DES-prox LAD  . LEFT HEART CATHETERIZATION WITH CORONARY ANGIOGRAM N/A 04/10/2012   Procedure: LEFT HEART CATHETERIZATION WITH CORONARY ANGIOGRAM;  Surgeon: Kathleene Hazel, MD;  Location: [facility] CATH LAB;  Service: Cardiovascular;   Laterality: N/A;  . LEFT HEART CATHETERIZATION WITH CORONARY ANGIOGRAM N/A 07/28/2012   Procedure: LEFT HEART CATHETERIZATION WITH CORONARY ANGIOGRAM;  Surgeon: Kathleene Hazel, MD;  Location: [facility] CATH LAB;  Service: Cardiovascular;  Laterality: N/A;  . 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 TONSILLECTOMY      Family History  Problem Relation Age of Onset  . Cancer Mother   . Arrhythmia Father     Social History   Socioeconomic History  . Marital status: Married    Spouse name: Not on file  . Number of children: Not on file  . Years of education: Not on file  . Highest  education level: Not on file  Occupational History  . Occupation: Probation officer    Comment: self-employed  Tobacco Use  . Smoking status: Former Smoker    Packs/day: 1.00    Years: 15.00    Pack years: 15.00    Types: Cigarettes    Quit date: 09/03/1995    Years since quitting: 24.3  . Smokeless tobacco: Former Neurosurgeon    Quit date: 01/30/1981  . Tobacco comment: Quit in 1982  Substance and Sexual Activity  . Alcohol use: Yes    Alcohol/week: 1.0 standard drinks    Types: 1 Standard drinks or equivalent per week    Comment: occ  . Drug use: No  . Sexual activity: Yes  Other Topics Concern  . 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   Social Determinants of Health   Financial Resource Strain:   . Difficulty of Paying Living Expenses:   Food Insecurity:   . Worried About Programme researcher, broadcasting/film/video in the Last Year:   . Barista in the Last Year:   Transportation Needs:   . Freight forwarder (Medical):   Marland Kitchen Lack of Transportation (Non-Medical):   Physical Activity:   . Days of Exercise per Week:   . Minutes of Exercise per Session:   Stress:   . Feeling of Stress :   Social Connections:   . Frequency of Communication with Friends and Family:   . Frequency of Social Gatherings with Friends and Family:   . Attends Religious Services:   .  Active Member of Clubs or Organizations:   . Attends Banker Meetings:   Marland Kitchen Marital Status:   Intimate Partner Violence:   . Fear of Current or Ex-Partner:   . Emotionally Abused:   Marland Kitchen Physically Abused:   . Sexually Abused:      ROS:  General: Negative for anorexia, weight loss, fever, chills, fatigue, weakness. Eyes: Negative for vision changes.  ENT: Negative for hoarseness, difficulty swallowing , nasal congestion. CV: Negative for chest pain, angina, palpitations, dyspnea on exertion, peripheral edema.  See HPI Respiratory: Negative for dyspnea at rest, dyspnea on exertion, cough, sputum, wheezing.  GI: See history of present illness. GU:  Negative for dysuria, hematuria, urinary incontinence, urinary frequency, nocturnal urination.  MS: Negative for joint pain, low back pain.  Derm: Negative for rash or itching.  Neuro: Negative for weakness, abnormal sensation, seizure, frequent headaches, memory loss, confusion.  Psych: Negative for anxiety, depression, suicidal ideation, hallucinations.  Endo: Negative for unusual weight change.  Heme: Negative for bruising or bleeding. Allergy: Negative for rash or hives.       Physical Examination: Vital signs in last 24 hours: Temp:  [98.5 F (36.9 C)-99.5 F (37.5 C)] 99.5 F (37.5 C) (05/20 0640) Pulse Rate:  [64-87] 74 (05/20 0820) Resp:  [14-24] 20 (05/20 0820) BP: (108-197)/(70-98) 139/79 (05/20 0820) SpO2:  [90 %-99 %] 99 % (05/20 0820) Weight:  [81.6 kg] 81.6 kg (05/19 1948) Last BM Date: 01/14/20  General: Well-nourished, well-developed in no acute distress.  Head: Normocephalic, atraumatic.   Eyes: Conjunctiva pink, no icterus. Mouth: Oropharyngeal mucosa moist and pink , no lesions erythema or exudate. Neck: Supple without thyromegaly, masses, or lymphadenopathy.  Lungs: Clear to auscultation bilaterally.  Heart: Regular rate and rhythm, no murmurs rubs or gallops.  Abdomen: Bowel sounds are normal,    nondistended, no hepatosplenomegaly or masses, no abdominal bruits or  hernia , no rebound or guarding.  Mild rlq tenderness Rectal: not performed Extremities: No lower extremity edema, clubbing, deformity.  Neuro: Alert and oriented x 4 , grossly normal neurologically.  Skin: Warm and dry, no rash or jaundice.   Psych: Alert and cooperative, normal mood and affect.        Intake/Output from previous day: 05/19 0701 - 05/20 0700 In: 779.2 [I.V.:779.2] Out: -  Intake/Output this shift: No intake/output data recorded.  Lab Results: CBC Recent Labs    01/13/20 2055  WBC 9.1  HGB 12.1*  HCT 36.7*  MCV 96.1  PLT 315   BMET Recent Labs    01/13/20 2055  NA 138  K 3.7  CL 103  CO2 27  GLUCOSE 104*  BUN 15  CREATININE 0.99  CALCIUM 8.8*   LFT Recent Labs    01/13/20 2055  BILITOT 0.7  ALKPHOS 57  AST 16  ALT 18  PROT 7.0  ALBUMIN 3.3*    Lipase Recent Labs    01/13/20 2055  LIPASE 22    PT/INR No results for input(s): LABPROT, INR in the last 72 hours.    Imaging Studies: DG Chest Portable 1 View  Result Date: 01/13/2020 CLINICAL DATA:  73-year-old male with acute chest pain. Former smoker. EXAM: PORTABLE CHEST 1 VIEW COMPARISON:  Chest radiographs 07/03/2016 and earlier. FINDINGS: Portable AP upright view at 2058 hours. Lung volumes and mediastinal contours remain normal. Left coronary artery calcified plaque and/or stent. Visualized tracheal air column is within normal limits. No pneumothorax. Allowing for portable technique the lungs are clear. Negative visible bowel gas pattern. No acute osseous abnormality identified. IMPRESSION: Negative portable chest. Electronically Signed   By: H  Hall M.D.   On: 01/13/2020 21:13  [4 week]   Impression: 73 y/o male with hypertension, CAD with remote stents on Effient, obstructive sleep apnea, hyperlipidemia presenting to the ED with complaints of chest pain.  Over the past several weeks he has had lower  abdominal discomfort/burning and decided to stop his medications for concern for possible ulcer.  He stopped Effient about a week ago when he started having bright red blood per rectum.  Complains of intermittent loose stools.  Rectal bleeding: Initially started out as bright red followed by a small blood clots.  Patient states he stopped Effient when symptoms started 1 week ago.  No bleeding in the past 2 days.  States he feels like it was a small amount of bleeding.  No melena.  History of anal canal hemorrhoids and diverticula on colonoscopy in 2014.  He has some vague intermittent loose stools with more recent concerns regarding excessive gas and flatulence.  Denies any significant recent antibiotic use.  Mild anemia noted back in November with hemoglobin 11.6.  Hemoglobin last night was 12.1.  Given some alteration of bowel function, would be concerned for possible underlying colitis, inflammtory versus infectious, less likely malignancy.  Would not be able to explain change in bowel based on hemorrhoid bleeding alone.  Plan: 1. Consider inpatient colonoscopy. 2. Monitor for significant bleeding.   We would like to thank you for the opportunity to participate in the care of Bradley Hurley.  Corney Knighton S. Nalayah Hitt, PA-C [facility] [phone] 5/20/20211:53 PM    LOS: 0 days      

## 2020-01-15 ENCOUNTER — Encounter (HOSPITAL_COMMUNITY): Payer: Self-pay | Admitting: Internal Medicine

## 2020-01-15 ENCOUNTER — Encounter (HOSPITAL_COMMUNITY)
Admission: EM | Disposition: A | Discharge status: Home / Self Care | Payer: Self-pay | Source: Home / Self Care | Attending: Internal Medicine

## 2020-01-15 ENCOUNTER — Ambulatory Visit: Payer: Medicare Other | Admitting: Gastroenterology

## 2020-01-15 HISTORY — PX: BIOPSY: SHX5522

## 2020-01-15 HISTORY — PX: COLONOSCOPY: SHX5424

## 2020-01-15 LAB — BASIC METABOLIC PANEL
Anion gap: 7 (ref 5–15)
BUN: 11 mg/dL (ref 8–23)
CO2: 22 mmol/L (ref 22–32)
Calcium: 8.2 mg/dL — ABNORMAL LOW (ref 8.9–10.3)
Chloride: 110 mmol/L (ref 98–111)
Creatinine, Ser: 0.97 mg/dL (ref 0.61–1.24)
GFR calc Af Amer: >60 mL/min (ref >60)
GFR calc non Af Amer: >60 mL/min (ref >60)
Glucose, Bld: 86 mg/dL (ref 70–99)
Potassium: 3.7 mmol/L (ref 3.5–5.1)
Sodium: 139 mmol/L (ref 135–145)

## 2020-01-15 LAB — CBC
HCT: 33.9 % — ABNORMAL LOW (ref 39.0–52.0)
Hemoglobin: 10.9 g/dL — ABNORMAL LOW (ref 13.0–17.0)
MCH: 31.1 pg (ref 26.0–34.0)
MCHC: 32.2 g/dL (ref 30.0–36.0)
MCV: 96.9 fL (ref 80.0–100.0)
Platelets: 299 10*3/uL (ref 150–400)
RBC: 3.5 MIL/uL — ABNORMAL LOW (ref 4.22–5.81)
RDW: 14.8 % (ref 11.5–15.5)
WBC: 9.8 10*3/uL (ref 4.0–10.5)
nRBC: 0 % (ref 0.0–0.2)

## 2020-01-15 LAB — MAGNESIUM: Magnesium: 1.8 mg/dL (ref 1.7–2.4)

## 2020-01-15 LAB — TROPONIN I (HIGH SENSITIVITY): Troponin I (High Sensitivity): 68 ng/L — ABNORMAL HIGH (ref <18)

## 2020-01-15 SURGERY — COLONOSCOPY
Anesthesia: Moderate Sedation

## 2020-01-15 MED ORDER — MEPERIDINE HCL 100 MG/ML IJ SOLN
INTRAMUSCULAR | Status: DC | PRN
  Administered 2020-01-15: 25 mg

## 2020-01-15 MED ORDER — POTASSIUM CHLORIDE CRYS ER 20 MEQ PO TBCR
40.0000 meq | EXTENDED_RELEASE_TABLET | Freq: Once | ORAL | Status: AC
  Administered 2020-01-15: 40 meq via ORAL
  Filled 2020-01-15: qty 2

## 2020-01-15 MED ORDER — MAGNESIUM OXIDE 400 (241.3 MG) MG PO TABS
400.0000 mg | ORAL_TABLET | Freq: Once | ORAL | Status: AC
  Administered 2020-01-15: 400 mg via ORAL
  Filled 2020-01-15: qty 1

## 2020-01-15 MED ORDER — CIPROFLOXACIN HCL 250 MG PO TABS
500.0000 mg | ORAL_TABLET | Freq: Two times a day (BID) | ORAL | Status: DC
  Administered 2020-01-15 – 2020-01-16 (×3): 500 mg via ORAL
  Filled 2020-01-15: qty 2
  Filled 2020-01-15 (×2): qty 1
  Filled 2020-01-15: qty 2
  Filled 2020-01-15 (×2): qty 1
  Filled 2020-01-15: qty 2
  Filled 2020-01-15 (×3): qty 1

## 2020-01-15 MED ORDER — MIDAZOLAM HCL 5 MG/5ML IJ SOLN
INTRAMUSCULAR | Status: DC | PRN
  Administered 2020-01-15: 2 mg via INTRAVENOUS
  Administered 2020-01-15 (×2): 1 mg via INTRAVENOUS

## 2020-01-15 MED ORDER — ONDANSETRON HCL 4 MG/2ML IJ SOLN
INTRAMUSCULAR | Status: DC | PRN
  Administered 2020-01-15: 4 mg via INTRAVENOUS

## 2020-01-15 MED ORDER — ONDANSETRON HCL 4 MG/2ML IJ SOLN
INTRAMUSCULAR | Status: AC
  Filled 2020-01-15: qty 2

## 2020-01-15 MED ORDER — MIDAZOLAM HCL 5 MG/5ML IJ SOLN
INTRAMUSCULAR | Status: AC
  Filled 2020-01-15: qty 5

## 2020-01-15 MED ORDER — SODIUM CHLORIDE 0.9 % IV SOLN: INTRAVENOUS | Status: DC

## 2020-01-15 MED ORDER — METRONIDAZOLE 500 MG PO TABS
500.0000 mg | ORAL_TABLET | Freq: Three times a day (TID) | ORAL | Status: DC
  Administered 2020-01-15 – 2020-01-16 (×4): 500 mg via ORAL
  Filled 2020-01-15 (×4): qty 1

## 2020-01-15 MED ORDER — MEPERIDINE HCL 50 MG/ML IJ SOLN
INTRAMUSCULAR | Status: AC
  Filled 2020-01-15: qty 1

## 2020-01-15 NOTE — Care Management Important Message (Signed)
Important Message  Patient Details  Name: Bradley Hurley MRN: [id number] Date of Birth: 04-01-1946   Medicare Important Message Given:  Yes     Corey Harold 01/15/2020, 11:42 AM

## 2020-01-15 NOTE — Progress Notes (Signed)
Attempted to have patient sign colonoscopy consent and patient refused stating that he was not going to sign it until the doctor explained it to him.  Patient also has not finished his colon prep and refuses to allow me to visualize his poop to see if he is clear enough for his procedure.  Rourk notified and will reevaluate in the morning.  Patient currently resting in his room.  Will continue to monitor.

## 2020-01-15 NOTE — Progress Notes (Addendum)
Progress Note  Patient Name: Bradley Hurley Date of Encounter: 01/15/2020  Primary Cardiologist: Nona Dell, MD  Subjective   No recurrent CP. Awaiting colonoscopy. While undergoing prep he did not see any recurrent clots in stool.  Inpatient Medications    Scheduled Meds:  atorvastatin  80 mg Oral QPM   carvedilol  6.25 mg Oral BID WC   isosorbide mononitrate  30 mg Oral Daily   Continuous Infusions:  sodium chloride 125 mL/hr at 01/14/20 1200   PRN Meds: acetaminophen, labetalol, nitroGLYCERIN, ondansetron (ZOFRAN) IV   Vital Signs    Vitals:   01/14/20 1200 01/14/20 1821 01/14/20 1959 01/15/20 0543  BP: (!) 179/88 (!) 149/80 (!) 157/78 (!) 149/78  Pulse: 72 65 72 68  Resp: 18 18 16 16   Temp:   99.5 F (37.5 C) 98.6 F (37 C)  TempSrc:   Oral Oral  SpO2: 98% 98% 98% 97%  Weight:      Height:        Intake/Output Summary (Last 24 hours) at 01/15/2020 0807 Last data filed at 01/14/2020 1800 Gross per 24 hour  Intake 2000 ml  Output 301 ml  Net 1699 ml   Last 3 Weights 01/13/2020 07/21/2019 01/20/2019  Weight (lbs) 180 lb 181 lb 182 lb  Weight (kg) 81.647 kg 82.101 kg 82.555 kg     Telemetry    NSR - Personally Reviewed  Physical Exam   GEN: No acute distress.  HEENT: Normocephalic, atraumatic, sclera non-icteric. Neck: No JVD or bruits. Cardiac: RRR no murmurs, rubs, or gallops.  Radials/DP/PT 1+ and equal bilaterally.  Respiratory: Clear to auscultation bilaterally. Breathing is unlabored. GI: Soft, nontender, non-distended, BS +x 4. MS: no deformity. Extremities: No clubbing or cyanosis. No edema. Distal pedal pulses are 2+ and equal bilaterally. Neuro:  AAOx3. Follows commands. Psych:  Responds to questions appropriately with a normal affect.  Labs    High Sensitivity Troponin:   Recent Labs  Lab 01/13/20 2055 01/13/20 2308 01/14/20 0709 01/15/20 0442  TROPONINIHS 12 66* 161* 68*      Cardiac EnzymesNo results for input(s):  TROPONINI in the last 168 hours. No results for input(s): TROPIPOC in the last 168 hours.   Chemistry Recent Labs  Lab 01/13/20 2055 01/15/20 0403  NA 138 139  K 3.7 3.7  CL 103 110  CO2 27 22  GLUCOSE 104* 86  BUN 15 11  CREATININE 0.99 0.97  CALCIUM 8.8* 8.2*  PROT 7.0  --   ALBUMIN 3.3*  --   AST 16  --   ALT 18  --   ALKPHOS 57  --   BILITOT 0.7  --   GFRNONAA >60 >60  GFRAA >60 >60  ANIONGAP 8 7     Hematology Recent Labs  Lab 01/13/20 2055 01/15/20 0403  WBC 9.1 9.8  RBC 3.82* 3.50*  HGB 12.1* 10.9*  HCT 36.7* 33.9*  MCV 96.1 96.9  MCH 31.7 31.1  MCHC 33.0 32.2  RDW 14.9 14.8  PLT 315 299    BNPNo results for input(s): BNP, PROBNP in the last 168 hours.   DDimer No results for input(s): DDIMER in the last 168 hours.   Radiology    DG Chest Portable 1 View  Result Date: 01/13/2020 CLINICAL DATA:  74 year old male with acute chest pain. Former smoker. EXAM: PORTABLE CHEST 1 VIEW COMPARISON:  Chest radiographs 07/03/2016 and earlier. FINDINGS: Portable AP upright view at 2058 hours. Lung volumes and mediastinal contours remain normal. Left  coronary artery calcified plaque and/or stent. Visualized tracheal air column is within normal limits. No pneumothorax. Allowing for portable technique the lungs are clear. Negative visible bowel gas pattern. No acute osseous abnormality identified. IMPRESSION: Negative portable chest. Electronically Signed   By: Odessa Fleming M.D.   On: 01/13/2020 21:13   ECHOCARDIOGRAM COMPLETE  Result Date: 01/14/2020    ECHOCARDIOGRAM REPORT   Patient Name:   Bradley Hurley Date of Exam: 01/14/2020 Medical Rec #:  [id number]      Height:       65.0 in Accession #:    [id number]     Weight:       180.0 lb Date of Birth:  03-May-1946      BSA:          1.892 m Patient Age:    73 years       BP:           179/88 mmHg Patient Gender: M              HR:           62 bpm. [facility] Procedure: 2D Echo, Cardiac Doppler and Color Doppler  Indications:    Chest Pain 786.50 / R07.9  History:        Patient has prior history of Echocardiogram examinations, most                 recent 02/09/2015. CAD and Previous Myocardial Infarction; Risk                 Factors:Hypertension and Dyslipidemia. Obstructive sleep apnea.  Sonographer:    Celesta Gentile RCS Referring Phys: [id number] Dorothe Pea Tamber Burtch IMPRESSIONS  1. Left ventricular ejection fraction, by estimation, is 65 to 70%. The left ventricle has normal function. The left ventricle has no regional wall motion abnormalities. There is mild left ventricular hypertrophy. Left ventricular diastolic parameters are consistent with Grade I diastolic dysfunction (impaired relaxation).  2. Right ventricular systolic function is normal. The right ventricular size is normal.  3. Left atrial size was mild to moderately dilated.  4. The mitral valve is normal in structure. Mild mitral valve regurgitation. No evidence of mitral stenosis.  5. The aortic valve is tricuspid. Aortic valve regurgitation is not visualized. No aortic stenosis is present.  6. The inferior vena cava is normal in size with greater than 50% respiratory variability, suggesting right atrial pressure of 3 mmHg. FINDINGS  Left Ventricle: Left ventricular ejection fraction, by estimation, is 65 to 70%. The left ventricle has normal function. The left ventricle has no regional wall motion abnormalities. The left ventricular internal cavity size was normal in size. There is  mild left ventricular hypertrophy. Left ventricular diastolic parameters are consistent with Grade I diastolic dysfunction (impaired relaxation). Normal left ventricular filling pressure. Right Ventricle: The right ventricular size is normal. No increase in right ventricular wall thickness. Right ventricular systolic function is normal. Left Atrium: Left atrial size was mild to moderately dilated. Right Atrium: Right atrial size was normal in size. Pericardium: There is no evidence  of pericardial effusion. Mitral Valve: The mitral valve is normal in structure. Mild mitral valve regurgitation. No evidence of mitral valve stenosis. Tricuspid Valve: The tricuspid valve is normal in structure. Tricuspid valve regurgitation is trivial. No evidence of tricuspid stenosis. Aortic Valve: The aortic valve is tricuspid. Aortic valve regurgitation is not visualized. No aortic stenosis is present. Aortic valve mean gradient measures 4.7 mmHg. Aortic  valve peak gradient measures 9.8 mmHg. Aortic valve area, by VTI measures 2.97 cm. Pulmonic Valve: The pulmonic valve was not well visualized. Pulmonic valve regurgitation is not visualized. No evidence of pulmonic stenosis. Aorta: The aortic root is normal in size and structure. Pulmonary Artery: Indeterminant PASP, inadequate TR jet. Venous: The inferior vena cava is normal in size with greater than 50% respiratory variability, suggesting right atrial pressure of 3 mmHg. IAS/Shunts: No atrial level shunt detected by color flow Doppler.  LEFT VENTRICLE PLAX 2D LVIDd:         4.98 cm  Diastology LVIDs:         2.86 cm  LV e' lateral:   8.59 cm/s LV PW:         1.17 cm  LV E/e' lateral: 9.3 LV IVS:        1.06 cm  LV e' medial:    7.29 cm/s LVOT diam:     2.00 cm  LV E/e' medial:  11.0 LV SV:         92 LV SV Index:   49 LVOT Area:     3.14 cm  RIGHT VENTRICLE RV S prime:     14.90 cm/s TAPSE (M-mode): 1.9 cm LEFT ATRIUM             Index       RIGHT ATRIUM           Index LA diam:        3.20 cm 1.69 cm/m  RA Area:     15.20 cm LA Vol (A2C):   69.8 ml 36.90 ml/m RA Volume:   37.40 ml  19.77 ml/m LA Vol (A4C):   60.5 ml 31.99 ml/m LA Biplane Vol: 64.9 ml 34.31 ml/m  AORTIC VALVE AV Area (Vmax):    2.41 cm AV Area (Vmean):   2.59 cm AV Area (VTI):     2.97 cm AV Vmax:           156.35 cm/s AV Vmean:          102.232 cm/s AV VTI:            0.310 m AV Peak Grad:      9.8 mmHg AV Mean Grad:      4.7 mmHg LVOT Vmax:         120.00 cm/s LVOT Vmean:         84.300 cm/s LVOT VTI:          0.293 m LVOT/AV VTI ratio: 0.95  AORTA Ao Root diam: 3.30 cm MITRAL VALVE MV Area (PHT): 2.72 cm     SHUNTS MV Decel Time: 279 msec     Systemic VTI:  0.29 m MV E velocity: 80.10 cm/s   Systemic Diam: 2.00 cm MV A velocity: 109.00 cm/s MV E/A ratio:  0.73 Dina Rich MD Electronically signed by Dina Rich MD Signature Date/Time: 01/14/2020/2:56:59 PM    Final    CT MAXILLOFACIAL WO CONTRAST  Result Date: 01/14/2020 CLINICAL DATA:  Chronic sinusitis. EXAM: CT MAXILLOFACIAL WITHOUT CONTRAST TECHNIQUE: Multidetector CT imaging of the maxillofacial structures was performed. Multiplanar CT image reconstructions were also generated. COMPARISON:  December 20, 2011. FINDINGS: Osseous: No fracture or mandibular dislocation. No destructive process. Orbits: Negative. No traumatic or inflammatory finding. Sinuses: Severe mucosal thickening is noted in the right maxillary sinus. Mild sinusitis is also noted in bilateral frontal and right ethmoid sinuses. There appears to occlusion of the right ostiomeatal complex secondary to mucosal thickening. Soft  tissues: Negative. Limited intracranial: No significant or unexpected finding. IMPRESSION: Severe right maxillary sinusitis. Mild bilateral frontal and right ethmoid sinusitis. This is progressed since prior exam of 2013. Electronically Signed   By: Lupita RaiderJames  Green Jr M.D.   On: 01/14/2020 11:05    Cardiac Studies   2D Echo 01/14/20 1. Left ventricular ejection fraction, by estimation, is 65 to 70%. The  left ventricle has normal function. The left ventricle has no regional  wall motion abnormalities. There is mild left ventricular hypertrophy.  Left ventricular diastolic parameters  are consistent with Grade I diastolic dysfunction (impaired relaxation).  2. Right ventricular systolic function is normal. The right ventricular  size is normal.  3. Left atrial size was mild to moderately dilated.  4. The mitral valve is normal in  structure. Mild mitral valve  regurgitation. No evidence of mitral stenosis.  5. The aortic valve is tricuspid. Aortic valve regurgitation is not  visualized. No aortic stenosis is present.  6. The inferior vena cava is normal in size with greater than 50%  respiratory variability, suggesting right atrial pressure of 3 mmHg.   Patient Profile     74 y.o. male with CAD (DES to LAD 2003, anterior MI s/p DES to prox-mid LAD in 2013), chronic angina, aspirin allergy, HTN, HLD, OSA, obesity, anxiety, depression, ED recently discontinued several home medications in the past 4 weeks due to stomach burning/pain and then development of hematochezia. Subsequent to this developed CP in the context of elevated BP, so came to ED 01/13/2020.  Assessment & Plan    1. Chest pain with elevated troponin and known history of CAD - given patient's known anatomy and chronic underlying angina, suspect this was related to being off antianginals and elevated blood pressure. 2D echocardiogram is reassuring with normal LV function and no wall motion abnormalities. Effient remains on hold due to GI workup, would resume when we are able (otherwise he is ASA allergic). Anticipate conservative management with outpatient f/u in the office post-discharge. If he has recurrent angina despite resumption of medical therapy, can revisit need for ischemic evaluation. Continue newly resumed BB + statin.  2. Hematochezia - ongoing for several weeks. This was preceded by abdominal pain that started 4 weeks ago described as burning. He also describes decreasing appetite for 3-4 years with gradual 30lb weight loss. GI workup underway.  3. Essential HTN, poorly controlled on arrival - carvedilol and Imdur resumed. Consider resumption of lisinopril today.  4. Hyperlipidemia - LDL above goal in setting of not taking medication. Atorvastatin restarted. If the patient is tolerating statin at time of follow-up appointment, would consider  rechecking liver function/lipid panel in 6-8 weeks.   5. Brief run of SVT on telemetry 01/14/20 - beta blocker has been resumed, no further recurrences. Will give oral supplemental K and Mg today for a goal K of 4.0 or greater and Mg 2.0 or greater today. Can increase dietary intake upon discharge and f/u in the office.  6. Sinusitis by CT - further per IM.  F/u tentatively arranged 6/7 with APP, appt placed on AVS.  For questions or updates, please contact [facility] Please consult [email or web address] for contact info under Cardiology/STEMI.  Signed, Laurann Montanaayna N Dunn, PA-C 01/15/2020, 8:07 AM    Attending note Patient seen and discussed with PA Dunn, I agree with her documentation. Isolated episode of chest pain with mild trop elevation, no acute EKG changes. Echo with hyperdynamic LV function and no new WMAs. Episode occurred after he self  continued his home medications. Workup is complicated by presenting with GI bleed. Given clinical scenario would continue to treat medically, no plans for ishcemic testing at this time. Resume effient when possible (history of ASA allergy on long term effient) when ok from GI standpoint.    We will sign off inpatient care.   [facility] will sign off.   Medication Recommendations:  Continue current regimen, resume effient when able from GI standpoint Other recommendations (labs, testing, etc):  none Follow up as an outpatient:  We will arrange    Carlyle Dolly MD

## 2020-01-15 NOTE — Plan of Care (Signed)
  Problem: Education: Goal: Knowledge of General Education information will improve Description: Including pain rating scale, medication(s)/side effects and non-pharmacologic comfort measures Outcome: Progressing   Problem: Health Behavior/Discharge Planning: Goal: Ability to manage health-related needs will improve Outcome: Progressing   Problem: Clinical Measurements: Goal: Will remain free from infection Outcome: Progressing Goal: Diagnostic test results will improve Outcome: Progressing   Problem: Coping: Goal: Level of anxiety will decrease Outcome: Progressing   

## 2020-01-15 NOTE — Progress Notes (Signed)
PROGRESS NOTE  Bradley Hurley OTL:[id number] DOB: 07/02/1946 DOA: 01/13/2020 PCP: Gareth Morgan, MD  Brief History:  74 year old male with a history of hypertension, hyperlipidemia, OSA, coronary artery disease, depression presenting with chest discomfort that began in the evening of 01/13/2020 after he ate a hot dog.  He described some heaviness and pressure that substernal radiating to the left and chin.  He denied any shortness of breath, fevers, chills, cough, hemoptysis, nausea, vomiting.  He stated that he quit taking all his medications a week ago because he felt like they were causing his stomach to be burning. In addition, the patient has been complaining of intermittent hematochezia for the past 2 to 3 weeks.  He denies any NSAID use.  He has had some intermittent loose stools.  There is no melena, abdominal pain, dysuria, hematuria.  He quit taking his Effient as well as the rest of his medications about 1 week prior to this admission.  Assessment/Plan: Chest pain/coronary artery disease -In part due to the patient's elevated blood pressure -Somewhat atypical by history -Cardiology consult appreciated-->Likely medical management with resumption of his home regimen -Echo--EF 65-70%, no WMA -Troponin 12>>>66 -Personally reviewed EKG--sinus rhythm, nonspecific T wave changes -Personally reviewed chest x-ray--no consolidations or edema -Restart carvedilol and Imdur  Hematochezia/Colitis -GI consult-->pursue colonoscopy -01/15/20 colonoscopy-segmental inflam changes of prox sigmoid c/w sigmoid colitis -Holding Lovenox and Effient pending work-up -Hemoglobin appears fairly stable;  Drop in part due to dilution  Hypertensive urgency -Patient quit taking his antihypertensive medications for 1 week prior to admission -Restart carvedilol, Imdur-->improved  Hyperlipidemia -Restart statin  Acute Sinusitis -CT maxillofacial--Severe right maxillary sinusitis. Mild  bilateral frontal and right ethmoid sinusitis -on cipro/flagyl for colitis    Status is: Inpatient    Dispo: The patient is from: Home              Anticipated d/c is to: Home              Anticipated d/c date is: 1 day if stable 5/22              Patient currently is not medically stable to d/c.         Family Communication:  No  Family at bedside  Consultants:  GI/Cardiology  Code Status:  FULL   DVT Prophylaxis:  SCDs   Procedures: As Listed in Progress Note Above  Antibiotics: None       Subjective: Had small volume hematochezia today.  Denies f/c cp, sob, n/v/d.  No melena or dysuria  Objective: Vitals:   01/15/20 1145 01/15/20 1150 01/15/20 1323 01/15/20 1344  BP: (!) 99/48  (!) 115/59 111/65  Pulse: (!) 58 60 61 63  Resp: 17 16 16 19   Temp:   97.8 F (36.6 C) (!) 97.4 F (36.3 C)  TempSrc:   Oral Oral  SpO2: 98% 98%  97%  Weight:      Height:        Intake/Output Summary (Last 24 hours) at 01/15/2020 1809 Last data filed at 01/15/2020 0900 Gross per 24 hour  Intake 0 ml  Output --  Net 0 ml   Weight change:  Exam:   General:  Pt is alert, follows commands appropriately, not in acute distress  HEENT: No icterus, No thrush, No neck mass, [place]/AT  Cardiovascular: RRR, S1/S2, no rubs, no gallops  Respiratory: CTA bilaterally, no wheezing, no crackles, no rhonchi  Abdomen: Soft/+BS, non tender, non  distended, no guarding  Extremities: 1+ LE edema, No lymphangitis, No petechiae, No rashes, no synovitis   Data Reviewed: I have personally reviewed following labs and imaging studies Basic Metabolic Panel: Recent Labs  Lab 01/13/20 2055 01/14/20 0709 01/15/20 0403  NA 138  --  139  K 3.7  --  3.7  CL 103  --  110  CO2 27  --  22  GLUCOSE 104*  --  86  BUN 15  --  11  CREATININE 0.99  --  0.97  CALCIUM 8.8*  --  8.2*  MG  --  1.8 1.8   Liver Function Tests: Recent Labs  Lab 01/13/20 2055  AST 16  ALT 18  ALKPHOS 57   BILITOT 0.7  PROT 7.0  ALBUMIN 3.3*   Recent Labs  Lab 01/13/20 2055  LIPASE 22   No results for input(s): AMMONIA in the last 168 hours. Coagulation Profile: No results for input(s): INR, PROTIME in the last 168 hours. CBC: Recent Labs  Lab 01/13/20 2055 01/15/20 0403  WBC 9.1 9.8  HGB 12.1* 10.9*  HCT 36.7* 33.9*  MCV 96.1 96.9  PLT 315 299   Cardiac Enzymes: No results for input(s): CKTOTAL, CKMB, CKMBINDEX, TROPONINI in the last 168 hours. BNP: Invalid input(s): POCBNP CBG: No results for input(s): GLUCAP in the last 168 hours. HbA1C: No results for input(s): HGBA1C in the last 72 hours. Urine analysis:    Component Value Date/Time   COLORURINE YELLOW 07/27/2012 0416   APPEARANCEUR CLEAR 07/27/2012 0416   LABSPEC 1.018 07/27/2012 0416   PHURINE 6.0 07/27/2012 0416   GLUCOSEU NEGATIVE 07/27/2012 0416   HGBUR NEGATIVE 07/27/2012 0416   BILIRUBINUR NEGATIVE 07/27/2012 0416   KETONESUR NEGATIVE 07/27/2012 0416   PROTEINUR NEGATIVE 07/27/2012 0416   UROBILINOGEN 0.2 07/27/2012 0416   NITRITE NEGATIVE 07/27/2012 0416   LEUKOCYTESUR NEGATIVE 07/27/2012 0416   Sepsis Labs: @LABRCNTIP (procalcitonin:4,lacticidven:4) ) Recent Results (from the past 240 hour(s))  SARS Coronavirus 2 by RT PCR (hospital order, performed in [facility] hospital lab) Nasopharyngeal Nasopharyngeal Swab     Status: None   Collection Time: 01/14/20  1:29 AM   Specimen: Nasopharyngeal Swab  Result Value Ref Range Status   SARS Coronavirus 2 NEGATIVE NEGATIVE Final    Comment: (NOTE) SARS-CoV-2 target nucleic acids are NOT DETECTED. The SARS-CoV-2 RNA is generally detectable in upper and lower respiratory specimens during the acute phase of infection. The lowest concentration of SARS-CoV-2 viral copies this assay can detect is 250 copies / mL. A negative result does not preclude SARS-CoV-2 infection and should not be used as the sole basis for treatment or other patient management  decisions.  A negative result may occur with improper specimen collection / handling, submission of specimen other than nasopharyngeal swab, presence of viral mutation(s) within the areas targeted by this assay, and inadequate number of viral copies (<250 copies / mL). A negative result must be combined with clinical observations, patient history, and epidemiological information. Fact Sheet for Patients:   StrictlyIdeas.no Fact Sheet for Healthcare Providers: BankingDealers.co.za This test is not yet approved or cleared  by the [place] FDA and has been authorized for detection and/or diagnosis of SARS-CoV-2 by FDA under an Emergency Use Authorization (EUA).  This EUA will remain in effect (meaning this test can be used) for the duration of the COVID-19 declaration under Section 564(b)(1) of the Act, 21 U.S.C. section 360bbb-3(b)(1), unless the authorization is terminated or revoked sooner. Performed at [facility],  [address], [place], [place] [address]      Scheduled Meds:  atorvastatin  80 mg Oral QPM   carvedilol  6.25 mg Oral BID WC   ciprofloxacin  500 mg Oral BID   isosorbide mononitrate  30 mg Oral Daily   meperidine       metroNIDAZOLE  500 mg Oral Q8H   midazolam       ondansetron       Continuous Infusions:  Procedures/Studies: DG Chest Portable 1 View  Result Date: 01/13/2020 CLINICAL DATA:  74 year old male with acute chest pain. Former smoker. EXAM: PORTABLE CHEST 1 VIEW COMPARISON:  Chest radiographs 07/03/2016 and earlier. FINDINGS: Portable AP upright view at 2058 hours. Lung volumes and mediastinal contours remain normal. Left coronary artery calcified plaque and/or stent. Visualized tracheal air column is within normal limits. No pneumothorax. Allowing for portable technique the lungs are clear. Negative visible bowel gas pattern. No acute osseous abnormality identified. IMPRESSION: Negative portable  chest. Electronically Signed   By: Odessa Fleming M.D.   On: 01/13/2020 21:13   ECHOCARDIOGRAM COMPLETE  Result Date: 01/14/2020    ECHOCARDIOGRAM REPORT   Patient Name:   Bradley Hurley Date of Exam: 01/14/2020 Medical Rec #:  [id number]      Height:       65.0 in Accession #:    [id number]     Weight:       180.0 lb Date of Birth:  28-Dec-1945      BSA:          1.892 m Patient Age:    73 years       BP:           179/88 mmHg Patient Gender: M              HR:           62 bpm. [facility] Procedure: 2D Echo, Cardiac Doppler and Color Doppler Indications:    Chest Pain 786.50 / R07.9  History:        Patient has prior history of Echocardiogram examinations, most                 recent 02/09/2015. CAD and Previous Myocardial Infarction; Risk                 Factors:Hypertension and Dyslipidemia. Obstructive sleep apnea.  Sonographer:    Celesta Gentile RCS Referring Phys: [id number] Dorothe Pea BRANCH IMPRESSIONS  1. Left ventricular ejection fraction, by estimation, is 65 to 70%. The left ventricle has normal function. The left ventricle has no regional wall motion abnormalities. There is mild left ventricular hypertrophy. Left ventricular diastolic parameters are consistent with Grade I diastolic dysfunction (impaired relaxation).  2. Right ventricular systolic function is normal. The right ventricular size is normal.  3. Left atrial size was mild to moderately dilated.  4. The mitral valve is normal in structure. Mild mitral valve regurgitation. No evidence of mitral stenosis.  5. The aortic valve is tricuspid. Aortic valve regurgitation is not visualized. No aortic stenosis is present.  6. The inferior vena cava is normal in size with greater than 50% respiratory variability, suggesting right atrial pressure of 3 mmHg. FINDINGS  Left Ventricle: Left ventricular ejection fraction, by estimation, is 65 to 70%. The left ventricle has normal function. The left ventricle has no regional wall motion abnormalities.  The left ventricular internal cavity size was normal in size. There is  mild left ventricular hypertrophy. Left  ventricular diastolic parameters are consistent with Grade I diastolic dysfunction (impaired relaxation). Normal left ventricular filling pressure. Right Ventricle: The right ventricular size is normal. No increase in right ventricular wall thickness. Right ventricular systolic function is normal. Left Atrium: Left atrial size was mild to moderately dilated. Right Atrium: Right atrial size was normal in size. Pericardium: There is no evidence of pericardial effusion. Mitral Valve: The mitral valve is normal in structure. Mild mitral valve regurgitation. No evidence of mitral valve stenosis. Tricuspid Valve: The tricuspid valve is normal in structure. Tricuspid valve regurgitation is trivial. No evidence of tricuspid stenosis. Aortic Valve: The aortic valve is tricuspid. Aortic valve regurgitation is not visualized. No aortic stenosis is present. Aortic valve mean gradient measures 4.7 mmHg. Aortic valve peak gradient measures 9.8 mmHg. Aortic valve area, by VTI measures 2.97 cm. Pulmonic Valve: The pulmonic valve was not well visualized. Pulmonic valve regurgitation is not visualized. No evidence of pulmonic stenosis. Aorta: The aortic root is normal in size and structure. Pulmonary Artery: Indeterminant PASP, inadequate TR jet. Venous: The inferior vena cava is normal in size with greater than 50% respiratory variability, suggesting right atrial pressure of 3 mmHg. IAS/Shunts: No atrial level shunt detected by color flow Doppler.  LEFT VENTRICLE PLAX 2D LVIDd:         4.98 cm  Diastology LVIDs:         2.86 cm  LV e' lateral:   8.59 cm/s LV PW:         1.17 cm  LV E/e' lateral: 9.3 LV IVS:        1.06 cm  LV e' medial:    7.29 cm/s LVOT diam:     2.00 cm  LV E/e' medial:  11.0 LV SV:         92 LV SV Index:   49 LVOT Area:     3.14 cm  RIGHT VENTRICLE RV S prime:     14.90 cm/s TAPSE (M-mode): 1.9 cm  LEFT ATRIUM             Index       RIGHT ATRIUM           Index LA diam:        3.20 cm 1.69 cm/m  RA Area:     15.20 cm LA Vol (A2C):   69.8 ml 36.90 ml/m RA Volume:   37.40 ml  19.77 ml/m LA Vol (A4C):   60.5 ml 31.99 ml/m LA Biplane Vol: 64.9 ml 34.31 ml/m  AORTIC VALVE AV Area (Vmax):    2.41 cm AV Area (Vmean):   2.59 cm AV Area (VTI):     2.97 cm AV Vmax:           156.35 cm/s AV Vmean:          102.232 cm/s AV VTI:            0.310 m AV Peak Grad:      9.8 mmHg AV Mean Grad:      4.7 mmHg LVOT Vmax:         120.00 cm/s LVOT Vmean:        84.300 cm/s LVOT VTI:          0.293 m LVOT/AV VTI ratio: 0.95  AORTA Ao Root diam: 3.30 cm MITRAL VALVE MV Area (PHT): 2.72 cm     SHUNTS MV Decel Time: 279 msec     Systemic VTI:  0.29 m MV E velocity: 80.10 cm/s  Systemic Diam: 2.00 cm MV A velocity: 109.00 cm/s MV E/A ratio:  0.73 Dina RichJonathan Branch MD Electronically signed by Dina RichJonathan Branch MD Signature Date/Time: 01/14/2020/2:56:59 PM    Final    CT MAXILLOFACIAL WO CONTRAST  Result Date: 01/14/2020 CLINICAL DATA:  Chronic sinusitis. EXAM: CT MAXILLOFACIAL WITHOUT CONTRAST TECHNIQUE: Multidetector CT imaging of the maxillofacial structures was performed. Multiplanar CT image reconstructions were also generated. COMPARISON:  December 20, 2011. FINDINGS: Osseous: No fracture or mandibular dislocation. No destructive process. Orbits: Negative. No traumatic or inflammatory finding. Sinuses: Severe mucosal thickening is noted in the right maxillary sinus. Mild sinusitis is also noted in bilateral frontal and right ethmoid sinuses. There appears to occlusion of the right ostiomeatal complex secondary to mucosal thickening. Soft tissues: Negative. Limited intracranial: No significant or unexpected finding. IMPRESSION: Severe right maxillary sinusitis. Mild bilateral frontal and right ethmoid sinusitis. This is progressed since prior exam of 2013. Electronically Signed   By: Lupita RaiderJames  Green Jr M.D.   On: 01/14/2020 11:05     Catarina Hartshornavid Zenita Kister, DO  [facility]  If 7PM-7AM, please contact night-coverage [email or web address] Password [email or web address] 01/15/2020, 6:09 PM   LOS: 1 day

## 2020-01-15 NOTE — Progress Notes (Signed)
I discussed the procedure and consent process with the patient.  He has no reservations at this point and agrees to proceed.  I assured him that we do not generally maintain video recording equipment and endoscopy.  Any pictures taken would be for his report in order to best care for him in a medical treatment capacity.  He verbalized understanding.  Per nursing notes the prep was completed with minimal flakes otherwise clear.   Thank you for allowing [place] to participate in the care of Bradley Flattery, DNP, AGNP-C Adult & Gerontological Nurse Practitioner [facility]

## 2020-01-15 NOTE — Progress Notes (Signed)
Tap water enema administered per order.  Patient tolerated well.  Clear yellow with small amount of flakes returned.  Patient decided to sign consent this morning.  Consent is signed and on the chart.  Will administer second tap water enema in an hour.

## 2020-01-15 NOTE — Interval H&P Note (Signed)
History and Physical Interval Note:  01/15/2020 11:18 AM  Bradley Hurley  has presented today for surgery, with the diagnosis of rectal bleeding, normocytic anemia, change in bowels.  The various methods of treatment have been discussed with the patient and family. After consideration of risks, benefits and other options for treatment, the patient has consented to  Procedure(s): COLONOSCOPY (N/A) as a surgical intervention.  The patient's history has been reviewed, patient examined, no change in status, stable for surgery.  I have reviewed the patient's chart and labs.  Questions were answered to the patient's satisfaction.     [name]   Patient seen and examined in the endoscopy unit.  Hemoglobin 10.9 this morning.  Reviewed informed consent in some detail including the hospital form.  Patient's questions were answered.  Risk benefits, limitations, alternatives and imponderables have been discussed.  Patient is agreeable.  Further recommendations to follow.

## 2020-01-15 NOTE — Discharge Instructions (Signed)
Please increase dietary intake of healthy sources of potassium/magnesium including leafy greens, nuts, bananas, squash, yogurt, fish, whole grains, white beans, sweet potatoes, and avocados.

## 2020-01-15 NOTE — Progress Notes (Signed)
Patient allowed nurse to visualize his stool.  It is still cloudy.  Patient is currently NPO and resting in his room.  Will continue to monitor.

## 2020-01-15 NOTE — Op Note (Signed)
[facility] Patient Name: Bradley Hurley Procedure Date: 01/15/2020 11:04 AM MRN: [id number] Date of Birth: [birth year] Attending MD: Gennette Pac , MD CSN: [id number] Age: 74 Admit Type: Inpatient Procedure:                Colonoscopy Indications:              Generalized abdominal pain, Hematochezia Providers:                Gennette Pac, MD, Jannett Celestine, RN, Burke Keels, Technician Referring MD:              Medicines:                Midazolam 4 mg IV, Meperidine 25 mg IV Complications:            No immediate complications. Estimated Blood Loss:     Estimated blood loss was minimal. Procedure:                Pre-Anesthesia Assessment:                           - Prior to the procedure, a History and Physical                            was performed, and patient medications and                            allergies were reviewed. The patient's tolerance of                            previous anesthesia was also reviewed. The risks                            and benefits of the procedure and the sedation                            options and risks were discussed with the patient.                            All questions were answered, and informed consent                            was obtained. Prior Anticoagulants: The patient has                            taken no previous anticoagulant or antiplatelet                            agents. ASA Grade Assessment: III - A patient with                            severe systemic disease. After reviewing the risks  and benefits, the patient was deemed in                            satisfactory condition to undergo the procedure.                           After obtaining informed consent, the colonoscope                            was passed under direct vision. Throughout the                            procedure, the patient's blood pressure, pulse, and                             oxygen saturations were monitored continuously. The                            CF-[id number] ([id number]) scope was introduced through                            the anus and advanced to the 5 cm into the ileum.                            The colonoscopy was performed without difficulty.                            The patient tolerated the procedure well. The                            quality of the bowel preparation was adequate. Scope In: 11:28:36 AM Scope Out: 11:43:45 AM Scope Withdrawal Time: 0 hours 10 minutes 24 seconds  Total Procedure Duration: 0 hours 15 minutes 9 seconds  Findings:      The perianal and digital rectal examinations were normal. Patient had       scattered sigmoid and descending diverticula. The proximal half of the       sigmoid was markedly abnormal with boggy inflamed eroded mucosa       involving a confluent 20 segment. The distal sigmoid and rectum along       with the proximal colon beginning with the descending segment onto the       cecum appeared otherwise normal. Distal 5 cm of terminal ileum appeared       normal. Biopsies of the abnormal segment taken. Impression:               -Segmental inflammatory change of the proximal                            sigmoid most consistent with segmental colitis                            associated with diverticula rather than ischemic or  segmental colitis given somewhat subacute                            presentation. I doubt idiopathic inflammatory bowel                            disease or infection. Moderate Sedation:      Moderate (conscious) sedation was administered by the endoscopy nurse       and supervised by the endoscopist. The following parameters were       monitored: oxygen saturation, heart rate, blood pressure, respiratory       rate, EKG, adequacy of pulmonary ventilation, and response to care.       Total physician intraservice time was 19  minutes. Recommendation:           - Return patient to hospital ward for ongoing care.                           - Low fiber diet. 10-day course of Cipro 500 mg                            twice daily and Flagyl 250 mg 3 times daily.                            Follow-up on pathology. Would resume Effient on                            5/23 (hold off for 2 days because of biopsies taken                            today). Further recommendations to follow.                           I called patient's wife, Bradley Hurley, [phone] and                            discussed my findings and recommendations. Procedure Code(s):        --- Professional ---                           [phone], Colonoscopy, flexible; diagnostic, including                            collection of specimen(s) by brushing or washing,                            when performed (separate procedure)                           G0500, Moderate sedation services provided by the                            same physician or other qualified health care  professional performing a gastrointestinal                            endoscopic service that sedation supports,                            requiring the presence of an independent trained                            observer to assist in the monitoring of the                            patient's level of consciousness and physiological                            status; initial 15 minutes of intra-service time;                            patient age 62 years or older (additional time may                            be reported with [id number], as appropriate) Diagnosis Code(s):        --- Professional ---                           R10.84, Generalized abdominal pain                           K92.1, Melena (includes Hematochezia) CPT copyright 2019 American Medical Association. All rights reserved. The codes documented in this report are preliminary and upon coder review may   be revised to meet current compliance requirements. Bradley Friends. Hampton Wixom, MD Gennette Pac, MD 01/15/2020 12:07:03 PM This report has been signed electronically. Number of Addenda: 0

## 2020-01-16 DIAGNOSIS — D649 Anemia, unspecified: Secondary | ICD-10-CM

## 2020-01-16 DIAGNOSIS — K921 Melena: Secondary | ICD-10-CM

## 2020-01-16 LAB — BASIC METABOLIC PANEL
Anion gap: 5 (ref 5–15)
BUN: 13 mg/dL (ref 8–23)
CO2: 22 mmol/L (ref 22–32)
Calcium: 8 mg/dL — ABNORMAL LOW (ref 8.9–10.3)
Chloride: 111 mmol/L (ref 98–111)
Creatinine, Ser: 1.02 mg/dL (ref 0.61–1.24)
GFR calc Af Amer: >60 mL/min (ref >60)
GFR calc non Af Amer: >60 mL/min (ref >60)
Glucose, Bld: 93 mg/dL (ref 70–99)
Potassium: 4 mmol/L (ref 3.5–5.1)
Sodium: 138 mmol/L (ref 135–145)

## 2020-01-16 LAB — CBC
HCT: 32.1 % — ABNORMAL LOW (ref 39.0–52.0)
Hemoglobin: 10.3 g/dL — ABNORMAL LOW (ref 13.0–17.0)
MCH: 31.2 pg (ref 26.0–34.0)
MCHC: 32.1 g/dL (ref 30.0–36.0)
MCV: 97.3 fL (ref 80.0–100.0)
Platelets: 288 10*3/uL (ref 150–400)
RBC: 3.3 MIL/uL — ABNORMAL LOW (ref 4.22–5.81)
RDW: 14.8 % (ref 11.5–15.5)
WBC: 8.1 10*3/uL (ref 4.0–10.5)
nRBC: 0 % (ref 0.0–0.2)

## 2020-01-16 LAB — MAGNESIUM: Magnesium: 1.8 mg/dL (ref 1.7–2.4)

## 2020-01-16 MED ORDER — CIPROFLOXACIN HCL 500 MG PO TABS: 500.0000 mg | ORAL_TABLET | Freq: Two times a day (BID) | ORAL | 0 refills | Status: DC

## 2020-01-16 MED ORDER — METRONIDAZOLE 500 MG PO TABS: 500.0000 mg | ORAL_TABLET | Freq: Three times a day (TID) | ORAL | 0 refills | Status: DC

## 2020-01-16 NOTE — Plan of Care (Signed)
  Problem: Clinical Measurements: Goal: Ability to maintain clinical measurements within normal limits will improve Outcome: Progressing Goal: Diagnostic test results will improve Outcome: Progressing   

## 2020-01-16 NOTE — Progress Notes (Signed)
Contacted by [employer]. Heart monitor showing asystole. In to check on patient. Alert and responsive. Right lower lead found off. Replaced lead. NSR on monitor.

## 2020-01-16 NOTE — Progress Notes (Signed)
Subjective:  Patient complains of mild abdominal pain in left lower quadrant of his abdomen.  He had a bowel movement and he passed small amount of liquid stool which is blood-tinged.  No frank bleeding or melena reported.  He states he has good appetite.  He has not had chest symptoms anymore.   Current Facility-Administered Medications:  .  acetaminophen (TYLENOL) tablet 650 mg, 650 mg, Oral, Q4H PRN, Rourk, Cristopher Estimable, MD .  atorvastatin (LIPITOR) tablet 80 mg, 80 mg, Oral, QPM, Daneil Dolin, MD, 80 mg at 01/15/20 1741 .  carvedilol (COREG) tablet 6.25 mg, 6.25 mg, Oral, BID WC, Rourk, Cristopher Estimable, MD, 6.25 mg at 01/16/20 0901 .  ciprofloxacin (CIPRO) tablet 500 mg, 500 mg, Oral, BID, Rourk, Cristopher Estimable, MD, 500 mg at 01/16/20 0900 .  isosorbide mononitrate (IMDUR) 24 hr tablet 30 mg, 30 mg, Oral, Daily, Rourk, Cristopher Estimable, MD, 30 mg at 01/16/20 0901 .  labetalol (NORMODYNE) injection 10 mg, 10 mg, Intravenous, Q6H PRN, Rourk, Cristopher Estimable, MD .  metroNIDAZOLE (FLAGYL) tablet 500 mg, 500 mg, Oral, Q8H, Rourk, Cristopher Estimable, MD, 500 mg at 01/16/20 0553 .  nitroGLYCERIN (NITROSTAT) SL tablet 0.4 mg, 0.4 mg, Sublingual, Q5 min PRN, Rourk, Cristopher Estimable, MD .  ondansetron [facility]) injection 4 mg, 4 mg, Intravenous, Q6H PRN, Daneil Dolin, MD, 4 mg at 01/15/20 0022   Objective: Blood pressure (!) 108/45, pulse 67, temperature 98.2 F (36.8 C), temperature source Oral, resp. rate 16, height _0  (1.651 m), weight 81.6 kg, SpO2 98 %. Patient is alert and in no acute distress. Abdomen is full.  Bowel sounds are normal.  On palpation abdomen is soft.  He has mild tenderness in left lower quadrant.  No guarding.  No organomegaly or masses.  Labs/studies Results:  CBC Latest Ref Rng & Units 01/16/2020 01/15/2020 01/13/2020  WBC 4.0 - 10.5 K/uL 8.1 9.8 9.1  Hemoglobin 13.0 - 17.0 g/dL 10.3(L) 10.9(L) 12.1(L)  Hematocrit 39.0 - 52.0 % 32.1(L) 33.9(L) 36.7(L)  Platelets 150 - 400 K/uL 288 299 315    CMP Latest Ref Rng  & Units 01/16/2020 01/15/2020 01/13/2020  Glucose 70 - 99 mg/dL 93 86 104(H)  BUN 8 - 23 mg/dL _1 Creatinine 0.61 - 1.24 mg/dL 1.02 0.97 0.99  Sodium 135 - 145 mmol/L 138 139 138  Potassium 3.5 - 5.1 mmol/L 4.0 3.7 3.7  Chloride 98 - 111 mmol/L 111 110 103  CO2 22 - 32 mmol/L _2 Calcium 8.9 - 10.3 mg/dL 8.0(L) 8.2(L) 8.8(L)  Total Protein 6.5 - 8.1 g/dL - - 7.0  Total Bilirubin 0.3 - 1.2 mg/dL - - 0.7  Alkaline Phos 38 - 126 U/L - - 57  AST 15 - 41 U/L - - 16  ALT 0 - 44 U/L - - 18    Hepatic Function Latest Ref Rng & Units 01/13/2020 07/21/2019 03/03/2014  Total Protein 6.5 - 8.1 g/dL 7.0 7.4 7.5  Albumin 3.5 - 5.0 g/dL 3.3(L) 3.6 4.1  AST 15 - 41 U/L _3 ALT 0 - 44 U/L _4 Alk Phosphatase 38 - 126 U/L 57 51 46  Total Bilirubin 0.3 - 1.2 mg/dL 0.7 0.7 0.6  Bilirubin, Direct 0.0 - 0.2 mg/dL - <0.1 0.1    : Biopsy pending.  Assessment:  #1.  Rectal bleeding.  He presented with low-volume hematochezia.  He underwent colonoscopy yesterday by Dr. Sydell Axon revealing sigmoid diverticulosis along with segmental  mild colitis involving 20 cm segment.  Biopsies taken.  Dr. Gala Romney is concerned that he may have segmental colitis associated with diverticulosis.  Biopsies are pending.  He is on Cipro and metronidazole for presumed infection. Patient stable from GI standpoint for discharge.  #2.  Anemia.  Hemoglobin has been gradually drifting down.  I do not believe drop in H&H is due to GI bleed which appears to be insignificant.  #3.  Right maxillary sinusitis.  Patient underwent sinus CT 2 days ago.  Patient is on Cipro and metronidazole which should help.  Recommendations  Resume Effient on 01/17/2020. Continue Cipro and metronidazole for another 8 days for total of 10 days. Patient informed that he can take Tylenol up to 2 g/day in divided dose on as-needed basis but should not take OTC NSAIDs such as ibuprofen and Naprosyn. Await biopsy results.  Dr. Gala Romney will follow  up. Will arrange for patient to have H&H next week.

## 2020-01-16 NOTE — Progress Notes (Signed)
Nsg Discharge Note  Admit Date:  01/13/2020 Discharge date: 01/16/2020   [name] to be D/C'd Home per MD order.  AVS completed.  Patient able to verbalize understanding.  Discharge Medication: Allergies as of 01/16/2020      Reactions   Aspirin Anaphylaxis   Throat closes   Nsaids       Medication List    STOP taking these medications   hydrochlorothiazide 25 MG tablet Commonly known as: HYDRODIURIL   lisinopril 40 MG tablet Commonly known as: ZESTRIL     TAKE these medications   atorvastatin 80 MG tablet Commonly known as: LIPITOR Take 1 tablet (80 mg total) by mouth every evening.   carvedilol 6.25 MG tablet Commonly known as: COREG TAKE 1 TABLET TWICE A DAY WITH FOOD   ciprofloxacin 500 MG tablet Commonly known as: CIPRO Take 1 tablet (500 mg total) by mouth 2 (two) times daily.   isosorbide mononitrate 30 MG 24 hr tablet Commonly known as: IMDUR Take 1 tablet (30 mg total) by mouth daily.   LORazepam 1 MG tablet Commonly known as: ATIVAN Take 1 mg by mouth at bedtime.   MEGA MULTI MEN PO Take 1 tablet by mouth daily as needed (energy).   metroNIDAZOLE 500 MG tablet Commonly known as: FLAGYL Take 1 tablet (500 mg total) by mouth every 8 (eight) hours.   nitroGLYCERIN 0.4 MG SL tablet Commonly known as: Nitrostat Place 1 tablet (0.4 mg total) under the tongue every 5 (five) minutes as needed for chest pain.   prasugrel 10 MG Tabs tablet Commonly known as: EFFIENT TAKE 1 TABLET(10 MG) BY MOUTH DAILY What changed: See the new instructions.       Discharge Assessment: Vitals:   01/15/20 2002 01/16/20 0502  BP: 119/61 (!) 108/45  Pulse: 61 67  Resp: 16 16  Temp: 99.1 F (37.3 C) 98.2 F (36.8 C)  SpO2: 97% 98%   Skin clean, dry and intact without evidence of skin break down, no evidence of skin tears noted. IV catheter discontinued intact. Site without signs and symptoms of complications - no redness or edema noted at insertion site,  patient denies c/o pain - only slight tenderness at site.  Dressing with slight pressure applied.  D/c Instructions-Education: Discharge instructions given to patient with verbalized understanding. D/c education completed with patient including follow up instructions, medication list, d/c activities limitations if indicated, with other d/c instructions as indicated by MD - patient able to verbalize understanding, all questions fully answered. Patient instructed to return to ED, call 911, or call MD for any changes in condition.  Patient escorted via WC, and D/C home via private auto.  Jethro Poling, RN 01/16/2020 3:19 PM

## 2020-01-16 NOTE — Discharge Summary (Signed)
Physician Discharge Summary  Marguarite ArbourRobert R Charters WUJ:[id number] DOB: December 10, 1945 DOA: 01/13/2020  PCP: Gareth MorganKnowlton, Steve, MD  Admit date: 01/13/2020 Discharge date: 01/16/2020  Admitted From: Home Disposition:  Home  Recommendations for Outpatient Follow-up:  1. Follow up with PCP in 1-2 weeks 2. Please obtain BMP/CBC in one week   Discharge Condition: Stable CODE STATUS: FULL Diet recommendation: Heart Healthy   Brief/Interim Summary: 74 year old male with a history of hypertension, hyperlipidemia, OSA, coronary artery disease, depression presenting with chest discomfort that began in the evening of 01/13/2020 after he ate a hot dog. He described some heaviness and pressure that substernal radiating to the left and chin. He denied any shortness of breath, fevers, chills, cough, hemoptysis, nausea, vomiting. He stated that he quit taking all his medications a week ago because he felt like they were causing his stomach to be burning. In addition, the patient has been complaining of intermittent hematochezia for the past 2 to 3 weeks. He denies any NSAID use. He has had some intermittent loose stools. There is no melena, abdominal pain, dysuria, hematuria. He quit taking his Effient as well as the rest of his medications about 1 week prior to this admission.  Discharge Diagnoses:  Chest pain/coronary artery disease -In part due to the patient's elevated blood pressure -Somewhat atypical by history -Cardiology consult appreciated-->Likely medical management with resumption of his home regimen -Echo--EF 65-70%, no WMA -Troponin12>>>66 -Personally reviewed EKG--sinus rhythm, nonspecific T wave changes -Personally reviewed chest x-ray--no consolidations or edema -Restarted carvedilol and Imdur-->no further chest pain -restart Effient on 01/17/20  Hematochezia/Colitis -GI consult-->pursue colonoscopy -01/15/20 colonoscopy-segmental inflam changes of prox sigmoid c/w sigmoid  colitis -Holding Lovenox and Effient pending work-up -Hemoglobin appears fairly stable;  slight Drop in part due to dilution -01/16/20--cleared by GI for discharge -restart Effient 01/17/20 per GI -cipro/flagyl x 8 more days after d/c  Hypertensive urgency -Patient quit taking his antihypertensive medications for 1 week prior to admission -Restart carvedilol, Imdur-->improved  Hyperlipidemia -Restart statin  Acute Sinusitis -CT maxillofacial--Severe right maxillary sinusitis. Mild bilateral frontal and right ethmoid sinusitis -on cipro/flagyl for colitis   Discharge Instructions   Allergies as of 01/16/2020      Reactions   Aspirin Anaphylaxis   Throat closes   Nsaids       Medication List    STOP taking these medications   hydrochlorothiazide 25 MG tablet Commonly known as: HYDRODIURIL   lisinopril 40 MG tablet Commonly known as: ZESTRIL     TAKE these medications   atorvastatin 80 MG tablet Commonly known as: LIPITOR Take 1 tablet (80 mg total) by mouth every evening.   carvedilol 6.25 MG tablet Commonly known as: COREG TAKE 1 TABLET TWICE A DAY WITH FOOD   ciprofloxacin 500 MG tablet Commonly known as: CIPRO Take 1 tablet (500 mg total) by mouth 2 (two) times daily.   isosorbide mononitrate 30 MG 24 hr tablet Commonly known as: IMDUR Take 1 tablet (30 mg total) by mouth daily.   LORazepam 1 MG tablet Commonly known as: ATIVAN Take 1 mg by mouth at bedtime.   MEGA MULTI MEN PO Take 1 tablet by mouth daily as needed (energy).   metroNIDAZOLE 500 MG tablet Commonly known as: FLAGYL Take 1 tablet (500 mg total) by mouth every 8 (eight) hours.   nitroGLYCERIN 0.4 MG SL tablet Commonly known as: Nitrostat Place 1 tablet (0.4 mg total) under the tongue every 5 (five) minutes as needed for chest pain.   prasugrel 10 MG Tabs  tablet Commonly known as: EFFIENT TAKE 1 TABLET(10 MG) BY MOUTH DAILY What changed: See the new instructions.       Follow-up Information    Leone Brand, NP Follow up.   Specialties: Cardiology, Radiology Why: [facility] - we have arranged a follow-up visit for you with Vernona Rieger, one of our nurse practitioners, on Monday February 01, 2020 at 3:00 PM (Arrive by 2:45 PM). Contact information: [address] [place] [address] [phone]          Allergies  Allergen Reactions  . Aspirin Anaphylaxis    Throat closes  . Nsaids     Consultations:  Cardiology  GI   Procedures/Studies: DG Chest Portable 1 View  Result Date: 01/13/2020 CLINICAL DATA:  74 year old male with acute chest pain. Former smoker. EXAM: PORTABLE CHEST 1 VIEW COMPARISON:  Chest radiographs 07/03/2016 and earlier. FINDINGS: Portable AP upright view at 2058 hours. Lung volumes and mediastinal contours remain normal. Left coronary artery calcified plaque and/or stent. Visualized tracheal air column is within normal limits. No pneumothorax. Allowing for portable technique the lungs are clear. Negative visible bowel gas pattern. No acute osseous abnormality identified. IMPRESSION: Negative portable chest. Electronically Signed   By: Odessa Fleming M.D.   On: 01/13/2020 21:13   ECHOCARDIOGRAM COMPLETE  Result Date: 01/14/2020    ECHOCARDIOGRAM REPORT   Patient Name:   Bradley Hurley Date of Exam: 01/14/2020 Medical Rec #:  [id number]      Height:       65.0 in Accession #:    [id number]     Weight:       180.0 lb Date of Birth:  31-Mar-1946      BSA:          1.892 m Patient Age:    73 years       BP:           179/88 mmHg Patient Gender: M              HR:           62 bpm. [facility] Procedure: 2D Echo, Cardiac Doppler and Color Doppler Indications:    Chest Pain 786.50 / R07.9  History:        Patient has prior history of Echocardiogram examinations, most                 recent 02/09/2015. CAD and Previous Myocardial Infarction; Risk                 Factors:Hypertension and Dyslipidemia. Obstructive sleep  apnea.  Sonographer:    Celesta Gentile RCS Referring Phys: [id number] Dorothe Pea BRANCH IMPRESSIONS  1. Left ventricular ejection fraction, by estimation, is 65 to 70%. The left ventricle has normal function. The left ventricle has no regional wall motion abnormalities. There is mild left ventricular hypertrophy. Left ventricular diastolic parameters are consistent with Grade I diastolic dysfunction (impaired relaxation).  2. Right ventricular systolic function is normal. The right ventricular size is normal.  3. Left atrial size was mild to moderately dilated.  4. The mitral valve is normal in structure. Mild mitral valve regurgitation. No evidence of mitral stenosis.  5. The aortic valve is tricuspid. Aortic valve regurgitation is not visualized. No aortic stenosis is present.  6. The inferior vena cava is normal in size with greater than 50% respiratory variability, suggesting right atrial pressure of 3 mmHg. FINDINGS  Left Ventricle: Left ventricular ejection fraction, by  estimation, is 65 to 70%. The left ventricle has normal function. The left ventricle has no regional wall motion abnormalities. The left ventricular internal cavity size was normal in size. There is  mild left ventricular hypertrophy. Left ventricular diastolic parameters are consistent with Grade I diastolic dysfunction (impaired relaxation). Normal left ventricular filling pressure. Right Ventricle: The right ventricular size is normal. No increase in right ventricular wall thickness. Right ventricular systolic function is normal. Left Atrium: Left atrial size was mild to moderately dilated. Right Atrium: Right atrial size was normal in size. Pericardium: There is no evidence of pericardial effusion. Mitral Valve: The mitral valve is normal in structure. Mild mitral valve regurgitation. No evidence of mitral valve stenosis. Tricuspid Valve: The tricuspid valve is normal in structure. Tricuspid valve regurgitation is trivial. No evidence of  tricuspid stenosis. Aortic Valve: The aortic valve is tricuspid. Aortic valve regurgitation is not visualized. No aortic stenosis is present. Aortic valve mean gradient measures 4.7 mmHg. Aortic valve peak gradient measures 9.8 mmHg. Aortic valve area, by VTI measures 2.97 cm. Pulmonic Valve: The pulmonic valve was not well visualized. Pulmonic valve regurgitation is not visualized. No evidence of pulmonic stenosis. Aorta: The aortic root is normal in size and structure. Pulmonary Artery: Indeterminant PASP, inadequate TR jet. Venous: The inferior vena cava is normal in size with greater than 50% respiratory variability, suggesting right atrial pressure of 3 mmHg. IAS/Shunts: No atrial level shunt detected by color flow Doppler.  LEFT VENTRICLE PLAX 2D LVIDd:         4.98 cm  Diastology LVIDs:         2.86 cm  LV e' lateral:   8.59 cm/s LV PW:         1.17 cm  LV E/e' lateral: 9.3 LV IVS:        1.06 cm  LV e' medial:    7.29 cm/s LVOT diam:     2.00 cm  LV E/e' medial:  11.0 LV SV:         92 LV SV Index:   49 LVOT Area:     3.14 cm  RIGHT VENTRICLE RV S prime:     14.90 cm/s TAPSE (M-mode): 1.9 cm LEFT ATRIUM             Index       RIGHT ATRIUM           Index LA diam:        3.20 cm 1.69 cm/m  RA Area:     15.20 cm LA Vol (A2C):   69.8 ml 36.90 ml/m RA Volume:   37.40 ml  19.77 ml/m LA Vol (A4C):   60.5 ml 31.99 ml/m LA Biplane Vol: 64.9 ml 34.31 ml/m  AORTIC VALVE AV Area (Vmax):    2.41 cm AV Area (Vmean):   2.59 cm AV Area (VTI):     2.97 cm AV Vmax:           156.35 cm/s AV Vmean:          102.232 cm/s AV VTI:            0.310 m AV Peak Grad:      9.8 mmHg AV Mean Grad:      4.7 mmHg LVOT Vmax:         120.00 cm/s LVOT Vmean:        84.300 cm/s LVOT VTI:          0.293 m LVOT/AV VTI ratio: 0.95  AORTA Ao Root diam: 3.30 cm MITRAL VALVE MV Area (PHT): 2.72 cm     SHUNTS MV Decel Time: 279 msec     Systemic VTI:  0.29 m MV E velocity: 80.10 cm/s   Systemic Diam: 2.00 cm MV A velocity: 109.00 cm/s  MV E/A ratio:  0.73 Carlyle Dolly MD Electronically signed by Carlyle Dolly MD Signature Date/Time: 01/14/2020/2:56:59 PM    Final    CT MAXILLOFACIAL WO CONTRAST  Result Date: 01/14/2020 CLINICAL DATA:  Chronic sinusitis. EXAM: CT MAXILLOFACIAL WITHOUT CONTRAST TECHNIQUE: Multidetector CT imaging of the maxillofacial structures was performed. Multiplanar CT image reconstructions were also generated. COMPARISON:  December 20, 2011. FINDINGS: Osseous: No fracture or mandibular dislocation. No destructive process. Orbits: Negative. No traumatic or inflammatory finding. Sinuses: Severe mucosal thickening is noted in the right maxillary sinus. Mild sinusitis is also noted in bilateral frontal and right ethmoid sinuses. There appears to occlusion of the right ostiomeatal complex secondary to mucosal thickening. Soft tissues: Negative. Limited intracranial: No significant or unexpected finding. IMPRESSION: Severe right maxillary sinusitis. Mild bilateral frontal and right ethmoid sinusitis. This is progressed since prior exam of 2013. Electronically Signed   By: Marijo Conception M.D.   On: 01/14/2020 11:05         Discharge Exam: Vitals:   01/15/20 2002 01/16/20 0502  BP: 119/61 (!) 108/45  Pulse: 61 67  Resp: 16 16  Temp: 99.1 F (37.3 C) 98.2 F (36.8 C)  SpO2: 97% 98%   Vitals:   01/15/20 1323 01/15/20 1344 01/15/20 2002 01/16/20 0502  BP: (!) 115/59 111/65 119/61 (!) 108/45  Pulse: 61 63 61 67  Resp: 16 19 16 16   Temp: 97.8 F (36.6 C) (!) 97.4 F (36.3 C) 99.1 F (37.3 C) 98.2 F (36.8 C)  TempSrc: Oral Oral Oral Oral  SpO2:  97% 97% 98%  Weight:      Height:        General: Pt is alert, awake, not in acute distress Cardiovascular: RRR, S1/S2 +, no rubs, no gallops Respiratory: CTA bilaterally, no wheezing, no rhonchi Abdominal: Soft, NT, ND, bowel sounds + Extremities: no edema, no cyanosis   The results of significant diagnostics from this hospitalization (including  imaging, microbiology, ancillary and laboratory) are listed below for reference.    Significant Diagnostic Studies: DG Chest Portable 1 View  Result Date: 01/13/2020 CLINICAL DATA:  74 year old male with acute chest pain. Former smoker. EXAM: PORTABLE CHEST 1 VIEW COMPARISON:  Chest radiographs 07/03/2016 and earlier. FINDINGS: Portable AP upright view at 2058 hours. Lung volumes and mediastinal contours remain normal. Left coronary artery calcified plaque and/or stent. Visualized tracheal air column is within normal limits. No pneumothorax. Allowing for portable technique the lungs are clear. Negative visible bowel gas pattern. No acute osseous abnormality identified. IMPRESSION: Negative portable chest. Electronically Signed   By: Genevie Ann M.D.   On: 01/13/2020 21:13   ECHOCARDIOGRAM COMPLETE  Result Date: 01/14/2020    ECHOCARDIOGRAM REPORT   Patient Name:   Bradley Hurley Date of Exam: 01/14/2020 Medical Rec #:  [id number]      Height:       65.0 in Accession #:    [id number]     Weight:       180.0 lb Date of Birth:  [birth year]      BSA:          1.892 m Patient Age:    74 years       BP:  179/88 mmHg Patient Gender: M              HR:           62 bpm. [facility] Procedure: 2D Echo, Cardiac Doppler and Color Doppler Indications:    Chest Pain 786.50 / R07.9  History:        Patient has prior history of Echocardiogram examinations, most                 recent 02/09/2015. CAD and Previous Myocardial Infarction; Risk                 Factors:Hypertension and Dyslipidemia. Obstructive sleep apnea.  Sonographer:    Celesta Gentile RCS Referring Phys: [id number] Dorothe Pea BRANCH IMPRESSIONS  1. Left ventricular ejection fraction, by estimation, is 65 to 70%. The left ventricle has normal function. The left ventricle has no regional wall motion abnormalities. There is mild left ventricular hypertrophy. Left ventricular diastolic parameters are consistent with Grade I diastolic dysfunction  (impaired relaxation).  2. Right ventricular systolic function is normal. The right ventricular size is normal.  3. Left atrial size was mild to moderately dilated.  4. The mitral valve is normal in structure. Mild mitral valve regurgitation. No evidence of mitral stenosis.  5. The aortic valve is tricuspid. Aortic valve regurgitation is not visualized. No aortic stenosis is present.  6. The inferior vena cava is normal in size with greater than 50% respiratory variability, suggesting right atrial pressure of 3 mmHg. FINDINGS  Left Ventricle: Left ventricular ejection fraction, by estimation, is 65 to 70%. The left ventricle has normal function. The left ventricle has no regional wall motion abnormalities. The left ventricular internal cavity size was normal in size. There is  mild left ventricular hypertrophy. Left ventricular diastolic parameters are consistent with Grade I diastolic dysfunction (impaired relaxation). Normal left ventricular filling pressure. Right Ventricle: The right ventricular size is normal. No increase in right ventricular wall thickness. Right ventricular systolic function is normal. Left Atrium: Left atrial size was mild to moderately dilated. Right Atrium: Right atrial size was normal in size. Pericardium: There is no evidence of pericardial effusion. Mitral Valve: The mitral valve is normal in structure. Mild mitral valve regurgitation. No evidence of mitral valve stenosis. Tricuspid Valve: The tricuspid valve is normal in structure. Tricuspid valve regurgitation is trivial. No evidence of tricuspid stenosis. Aortic Valve: The aortic valve is tricuspid. Aortic valve regurgitation is not visualized. No aortic stenosis is present. Aortic valve mean gradient measures 4.7 mmHg. Aortic valve peak gradient measures 9.8 mmHg. Aortic valve area, by VTI measures 2.97 cm. Pulmonic Valve: The pulmonic valve was not well visualized. Pulmonic valve regurgitation is not visualized. No evidence of  pulmonic stenosis. Aorta: The aortic root is normal in size and structure. Pulmonary Artery: Indeterminant PASP, inadequate TR jet. Venous: The inferior vena cava is normal in size with greater than 50% respiratory variability, suggesting right atrial pressure of 3 mmHg. IAS/Shunts: No atrial level shunt detected by color flow Doppler.  LEFT VENTRICLE PLAX 2D LVIDd:         4.98 cm  Diastology LVIDs:         2.86 cm  LV e' lateral:   8.59 cm/s LV PW:         1.17 cm  LV E/e' lateral: 9.3 LV IVS:        1.06 cm  LV e' medial:    7.29 cm/s LVOT diam:  2.00 cm  LV E/e' medial:  11.0 LV SV:         92 LV SV Index:   49 LVOT Area:     3.14 cm  RIGHT VENTRICLE RV S prime:     14.90 cm/s TAPSE (M-mode): 1.9 cm LEFT ATRIUM             Index       RIGHT ATRIUM           Index LA diam:        3.20 cm 1.69 cm/m  RA Area:     15.20 cm LA Vol (A2C):   69.8 ml 36.90 ml/m RA Volume:   37.40 ml  19.77 ml/m LA Vol (A4C):   60.5 ml 31.99 ml/m LA Biplane Vol: 64.9 ml 34.31 ml/m  AORTIC VALVE AV Area (Vmax):    2.41 cm AV Area (Vmean):   2.59 cm AV Area (VTI):     2.97 cm AV Vmax:           156.35 cm/s AV Vmean:          102.232 cm/s AV VTI:            0.310 m AV Peak Grad:      9.8 mmHg AV Mean Grad:      4.7 mmHg LVOT Vmax:         120.00 cm/s LVOT Vmean:        84.300 cm/s LVOT VTI:          0.293 m LVOT/AV VTI ratio: 0.95  AORTA Ao Root diam: 3.30 cm MITRAL VALVE MV Area (PHT): 2.72 cm     SHUNTS MV Decel Time: 279 msec     Systemic VTI:  0.29 m MV E velocity: 80.10 cm/s   Systemic Diam: 2.00 cm MV A velocity: 109.00 cm/s MV E/A ratio:  0.73 Dina Rich MD Electronically signed by Dina Rich MD Signature Date/Time: 01/14/2020/2:56:59 PM    Final    CT MAXILLOFACIAL WO CONTRAST  Result Date: 01/14/2020 CLINICAL DATA:  Chronic sinusitis. EXAM: CT MAXILLOFACIAL WITHOUT CONTRAST TECHNIQUE: Multidetector CT imaging of the maxillofacial structures was performed. Multiplanar CT image reconstructions were also  generated. COMPARISON:  December 20, 2011. FINDINGS: Osseous: No fracture or mandibular dislocation. No destructive process. Orbits: Negative. No traumatic or inflammatory finding. Sinuses: Severe mucosal thickening is noted in the right maxillary sinus. Mild sinusitis is also noted in bilateral frontal and right ethmoid sinuses. There appears to occlusion of the right ostiomeatal complex secondary to mucosal thickening. Soft tissues: Negative. Limited intracranial: No significant or unexpected finding. IMPRESSION: Severe right maxillary sinusitis. Mild bilateral frontal and right ethmoid sinusitis. This is progressed since prior exam of 2013. Electronically Signed   By: Lupita Raider M.D.   On: 01/14/2020 11:05     Microbiology: Recent Results (from the past 240 hour(s))  SARS Coronavirus 2 by RT PCR (hospital order, performed in [facility] hospital lab) Nasopharyngeal Nasopharyngeal Swab     Status: None   Collection Time: 01/14/20  1:29 AM   Specimen: Nasopharyngeal Swab  Result Value Ref Range Status   SARS Coronavirus 2 NEGATIVE NEGATIVE Final    Comment: (NOTE) SARS-CoV-2 target nucleic acids are NOT DETECTED. The SARS-CoV-2 RNA is generally detectable in upper and lower respiratory specimens during the acute phase of infection. The lowest concentration of SARS-CoV-2 viral copies this assay can detect is 250 copies / mL. A negative result does not preclude SARS-CoV-2 infection and should  not be used as the sole basis for treatment or other patient management decisions.  A negative result may occur with improper specimen collection / handling, submission of specimen other than nasopharyngeal swab, presence of viral mutation(s) within the areas targeted by this assay, and inadequate number of viral copies (<250 copies / mL). A negative result must be combined with clinical observations, patient history, and epidemiological information. Fact Sheet for Patients:    BoilerBrush.com.cy Fact Sheet for Healthcare Providers: https://pope.com/ This test is not yet approved or cleared  by the [place] FDA and has been authorized for detection and/or diagnosis of SARS-CoV-2 by FDA under an Emergency Use Authorization (EUA).  This EUA will remain in effect (meaning this test can be used) for the duration of the COVID-19 declaration under Section 564(b)(1) of the Act, 21 U.S.C. section 360bbb-3(b)(1), unless the authorization is terminated or revoked sooner. Performed at [facility], [address] [place], [place] [address]      Labs: Basic Metabolic Panel: Recent Labs  Lab 01/13/20 2055 01/13/20 2055 01/14/20 0709 01/15/20 0403 01/16/20 0535  NA 138  --   --  139 138  K 3.7   < >  --  3.7 4.0  CL 103  --   --  110 111  CO2 27  --   --  22 22  GLUCOSE 104*  --   --  86 93  BUN 15  --   --  11 13  CREATININE 0.99  --   --  0.97 1.02  CALCIUM 8.8*  --   --  8.2* 8.0*  MG  --   --  1.8 1.8 1.8   < > = values in this interval not displayed.   Liver Function Tests: Recent Labs  Lab 01/13/20 2055  AST 16  ALT 18  ALKPHOS 57  BILITOT 0.7  PROT 7.0  ALBUMIN 3.3*   Recent Labs  Lab 01/13/20 2055  LIPASE 22   No results for input(s): AMMONIA in the last 168 hours. CBC: Recent Labs  Lab 01/13/20 2055 01/15/20 0403 01/16/20 0535  WBC 9.1 9.8 8.1  HGB 12.1* 10.9* 10.3*  HCT 36.7* 33.9* 32.1*  MCV 96.1 96.9 97.3  PLT 315 299 288   Cardiac Enzymes: No results for input(s): CKTOTAL, CKMB, CKMBINDEX, TROPONINI in the last 168 hours. BNP: Invalid input(s): POCBNP CBG: No results for input(s): GLUCAP in the last 168 hours.  Time coordinating discharge:  36 minutes  Signed:  Catarina Hartshorn, DO [facility] Pager: [phone] 01/16/2020, 5:35 PM

## 2020-01-18 LAB — SURGICAL PATHOLOGY

## 2020-01-19 ENCOUNTER — Telehealth: Payer: Self-pay | Admitting: *Deleted

## 2020-01-19 NOTE — Telephone Encounter (Signed)
Pt walked into office after reviewing his d/c summary. Pt would like to know why lisinopril was stopped. He reports that his BP last night was 170/80.

## 2020-01-19 NOTE — Telephone Encounter (Signed)
I reviewed the chart.  Dr. Wyline Mood was involved in Mr. Lynam care in consultation during the recent hospital stay.  According to Dr. Verna Czech note from May 21, he suggested reinitiating lisinopril.  It does not appear that the discharging physician Dr. Arbutus Leas reinitiated lisinopril.  If his blood pressure remains elevated, I would suggest going back on the previous dose of lisinopril.

## 2020-01-20 ENCOUNTER — Encounter: Payer: Self-pay | Admitting: Internal Medicine

## 2020-01-20 ENCOUNTER — Telehealth: Payer: Self-pay

## 2020-01-20 ENCOUNTER — Encounter: Payer: Self-pay | Admitting: Family Medicine

## 2020-01-20 NOTE — Telephone Encounter (Signed)
Per RMR-Send letter to patient.  Send copy of letter with path to referring provider and PCP.   He needs a follow-up appointment in the office in the next couple of weeks if not already scheduled.

## 2020-01-20 NOTE — Telephone Encounter (Signed)
Pt notified and notified of Dr.McDowell's recommendations. Pt voiced understanding.

## 2020-01-21 NOTE — Telephone Encounter (Signed)
OV made, letter mailed and patient put on cancellation list.

## 2020-01-26 DIAGNOSIS — I2510 Atherosclerotic heart disease of native coronary artery without angina pectoris: Secondary | ICD-10-CM | POA: Diagnosis not present

## 2020-01-26 DIAGNOSIS — I10 Essential (primary) hypertension: Secondary | ICD-10-CM | POA: Diagnosis not present

## 2020-01-26 DIAGNOSIS — Z7189 Other specified counseling: Secondary | ICD-10-CM | POA: Diagnosis not present

## 2020-01-26 DIAGNOSIS — A09 Infectious gastroenteritis and colitis, unspecified: Secondary | ICD-10-CM | POA: Diagnosis not present

## 2020-01-27 DIAGNOSIS — K047 Periapical abscess without sinus: Secondary | ICD-10-CM | POA: Diagnosis not present

## 2020-01-27 DIAGNOSIS — R799 Abnormal finding of blood chemistry, unspecified: Secondary | ICD-10-CM | POA: Diagnosis not present

## 2020-01-27 DIAGNOSIS — E6609 Other obesity due to excess calories: Secondary | ICD-10-CM | POA: Diagnosis not present

## 2020-01-27 DIAGNOSIS — J189 Pneumonia, unspecified organism: Secondary | ICD-10-CM | POA: Diagnosis not present

## 2020-01-27 DIAGNOSIS — I10 Essential (primary) hypertension: Secondary | ICD-10-CM | POA: Diagnosis not present

## 2020-01-27 DIAGNOSIS — Z79899 Other long term (current) drug therapy: Secondary | ICD-10-CM | POA: Diagnosis not present

## 2020-01-27 DIAGNOSIS — I2510 Atherosclerotic heart disease of native coronary artery without angina pectoris: Secondary | ICD-10-CM | POA: Diagnosis not present

## 2020-01-27 DIAGNOSIS — E7800 Pure hypercholesterolemia, unspecified: Secondary | ICD-10-CM | POA: Diagnosis not present

## 2020-01-27 DIAGNOSIS — R5383 Other fatigue: Secondary | ICD-10-CM | POA: Diagnosis not present

## 2020-01-27 DIAGNOSIS — A09 Infectious gastroenteritis and colitis, unspecified: Secondary | ICD-10-CM | POA: Diagnosis not present

## 2020-01-31 NOTE — Progress Notes (Signed)
Cardiology Office Note   Date:  02/01/2020   ID:  Riggs, Dineen 1946-08-13, MRN [id number]  PCP:  Lemmie Evens, MD  Cardiologist:  Dr. Domenic Polite    Chief Complaint  Patient presents with  . Hospitalization Follow-up      History of Present Illness: Bradley Hurley is a 74 y.o. male who presents for post hospital.   hx of CAD (DES to LAD 2003, anterior MI s/p DES to prox-mid LAD in 2013), chronic angina, aspirin allergy, HTN, HLD, OSA, obesity, anxiety, depression,   He has history of stenting to the LAD in 2003. He had an anterior STEMI in 2013 with thrombectomy of the mid LAD and PTCA/DES x2 to the proximal and mid LAD.  otherwise showed diffuse small vessel disease in LAD and Circumflex. Last nuclear stress test in 12/2016 showed primarily fixed medium-sized basal to mid inferior and basal to mid inferoseptal perfusion defect, there does not appear to be significant ischemia, + possible prior infarction versus diaphragmatic attenuation, EF 52%, mild diffuse ischemia, overall low risk study - managed medically. He has history of chronic angina with higher levels of exertion but not ADLs  01/13/20 to 01/16/20 hospitalized -- He noticed a centralized burning sensation across his stomach. He thought he might be getting an ulcer so over the next few weeks began dabbling in self-adjusting and self-discontinuing all his medications. He would add some back then stop them again to see what helped but nothing did. 2 weeks ago he began noticing pink blood upon wiping. This then progressed to loose stools with small blood clots at times. He assumed this was due to his Effient so he also stopped this. He did not consult with a physician until Tuesday 5/18 when he saw his family doctor who facilitated ASAP referral to GI that was pending for tomorrow. However, last night the patient developed an episode of chest discomfort in the context of elevated blood pressure. He had eaten a hot dog and then  while at rest some time after developed "indigestion" in his chest with radiation to his neck/jaw similar to prior angina. No nausea, vomiting, diaphoresis or dyspnea. He took 1 SL NTG and 1 Effient with mild improvement. Due to lack of complete resolution he called EMS. He had gradual full resolution of discomfort after the SL NTG although discomfort waxed/waned for about an hour after that. Initial BP in the ED was 197/97. This morning he feels totally fine. He does not take any NSAIDS because he states he's otherwise allergic - takes Tylenol only. Workup reveals hsTroponin 12->66->161, Hgb 12.1 (previous value 11.6 in 06/2019), albumin 3.3, LDL 128, Covid test negative. NSR 73bpm, lower voltage precordial leads, baseline wander, nonspecific swooped appearance of ST segment in III, avF, no acute change from prior.   Echo with EF 65-70% no RWMA, mild LVH G1DD mild MR,  Resume effient, due to ASA allergy  Continue BB and statin.  + colitis on colonoscopy.   Lisinopril was resumed after pt discharged.    Today he has no complaints, no chest pain or SOB,  May have had small amount of blood in stool since beginning effient.   He had labs done with Dr. Karie Kirks last couple of days but has not heard back.  His BP was elevated so back on lisinopril 40 mg daily.  Back on home meds.  He is to see GI on Friday.  He still has tooth pain related to his sinus infection.  Plans to  see ENT.  He has competed his ABX.  BP today is well controlled.     . Past Medical History:  Diagnosis Date  . Anxiety   . CAD (coronary artery disease)    DES to prox-mid LAD 2013  . Cholelithiasis   . Depression   . Deviated septum   . Diverticulosis   . Erectile dysfunction    Low serum testosterone level  . Essential hypertension   . Hemorrhoid   . Hyperlipidemia   . MI (myocardial infarction) (HCC)    Anterior STEMI 8/13  . Obesity   . Obstructive sleep apnea   . Tinnitus     Past Surgical History:  Procedure  Laterality Date  . APPENDECTOMY    . BIOPSY  01/15/2020   Procedure: BIOPSY;  Surgeon: Corbin Ade, MD;  [facility];  Service: Endoscopy;;  . CARDIAC CATHETERIZATION  07/28/2012   20% distal left main, patent prox & mid LAD stents w/ 25% ISR of the latter (unchanged from 03/2012 cath, mid-LAD stent placed in 2003), 30-40% ostial LCx, 40-50% distal marginal, 30% distal serial RCA stenoses; LVEF 55% with apical HK  . COLONOSCOPY N/A 08/12/2013   rourk: anal canal hemorrhoids (minimal), abnormal sigmoid colon with diverticula and mucosal edema and submucosal petechia (bx showed Mild changes of diverticular colitis or diverticulitis. next tcs in 10 years.   . COLONOSCOPY N/A 01/15/2020   Procedure: COLONOSCOPY;  Surgeon: Corbin Ade, MD;  Location: [facility];  Service: Endoscopy;  Laterality: N/A;  . CORONARY ANGIOPLASTY WITH STENT PLACEMENT  2003   mid LAD  . CORONARY ANGIOPLASTY WITH STENT PLACEMENT  03/2012   STEMI s/p DES-prox LAD  . LEFT HEART CATHETERIZATION WITH CORONARY ANGIOGRAM N/A 04/10/2012   Procedure: LEFT HEART CATHETERIZATION WITH CORONARY ANGIOGRAM;  Surgeon: Kathleene Hazel, MD;  Location: [facility] CATH LAB;  Service: Cardiovascular;  Laterality: N/A;  . LEFT HEART CATHETERIZATION WITH CORONARY ANGIOGRAM N/A 07/28/2012   Procedure: LEFT HEART CATHETERIZATION WITH CORONARY ANGIOGRAM;  Surgeon: Kathleene Hazel, MD;  Location: [facility] CATH LAB;  Service: Cardiovascular;  Laterality: N/A;  . 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 TONSILLECTOMY       Current Outpatient Medications  Medication Sig Dispense Refill  . atorvastatin (LIPITOR) 80 MG tablet Take 1 tablet (80 mg total) by mouth every evening. 90 tablet 3  . carvedilol (COREG) 6.25 MG tablet TAKE 1 TABLET TWICE A DAY WITH FOOD (Patient taking differently: Take 6.25 mg by mouth 2 (two) times daily with a meal. ) 60 tablet 6  . isosorbide mononitrate (IMDUR) 30 MG 24 hr tablet Take 1 tablet (30 mg  total) by mouth daily. 90 tablet 3  . lisinopril (ZESTRIL) 40 MG tablet Take 40 mg by mouth daily.    Marland Kitchen LORazepam (ATIVAN) 1 MG tablet Take 1 mg by mouth at bedtime.     . Multiple Vitamins-Minerals (MEGA MULTI MEN PO) Take 1 tablet by mouth daily as needed (energy).    . nitroGLYCERIN (NITROSTAT) 0.4 MG SL tablet Place 1 tablet (0.4 mg total) under the tongue every 5 (five) minutes as needed for chest pain. 25 tablet 3  . prasugrel (EFFIENT) 10 MG TABS tablet Take 1 tablet (10 mg total) by mouth daily. 30 tablet 6   No current facility-administered medications for this visit.    Allergies:   Aspirin and Nsaids    Social History:  The patient  reports that he quit  smoking about 24 years ago. His smoking use included cigarettes. He has a 15.00 pack-year smoking history. He quit smokeless tobacco use about 39 years ago. He reports current alcohol use of about 1.0 standard drinks of alcohol per week. He reports that he does not use drugs.   Family History:  The patient's family history includes Arrhythmia in his father; Cancer in his mother.    ROS:  General:no colds or fevers, no weight changes Skin:no rashes or ulcers HEENT:no blurred vision, no congestion CV:see HPI PUL:see HPI GI:no diarrhea constipation or melena, no indigestion GU:no hematuria, no dysuria MS:no joint pain, no claudication Neuro:no syncope, no lightheadedness Endo:no diabetes, no thyroid disease  Wt Readings from Last 3 Encounters:  02/01/20 180 lb (81.6 kg)  01/13/20 180 lb (81.6 kg)  07/21/19 181 lb (82.1 kg)     PHYSICAL EXAM: VS:  BP 124/76   Pulse 75   Ht 5\' 5"  (1.651 m)   Wt 180 lb (81.6 kg)   SpO2 96%   BMI 29.95 kg/m  , BMI Body mass index is 29.95 kg/m. General:Pleasant affect, NAD Skin:Warm and dry, brisk capillary refill HEENT:normocephalic, sclera clear, mucus membranes moist Neck:supple, no JVD, no bruits  Heart:S1S2 RRR without murmur, gallup, rub or click Lungs:clear without rales,  rhonchi, or wheezes , non tender, + BS, do not palpate liver spleen or masses Ext:no lower ext edema, 2+ pedal pulses, 2+ radial pulses Neuro:alert and oriented, MAE, follows commands, + facial symmetry    EKG:  EKG is NOT ordered today.   Recent Labs: 01/13/2020: ALT 18 01/16/2020: BUN 13; Creatinine, Ser 1.02; Hemoglobin 10.3; Magnesium 1.8; Platelets 288; Potassium 4.0; Sodium 138    Lipid Panel    Component Value Date/Time   CHOL 180 01/13/2020 2308   TRIG 56 01/13/2020 2308   HDL 41 01/13/2020 2308   CHOLHDL 4.4 01/13/2020 2308   VLDL 11 01/13/2020 2308   LDLCALC 128 (H) 01/13/2020 2308       Other studies Reviewed: Additional studies/ records that were reviewed today include: . Echo 01/14/20 IMPRESSIONS    1. Left ventricular ejection fraction, by estimation, is 65 to 70%. The  left ventricle has normal function. The left ventricle has no regional  wall motion abnormalities. There is mild left ventricular hypertrophy.  Left ventricular diastolic parameters  are consistent with Grade I diastolic dysfunction (impaired relaxation).  2. Right ventricular systolic function is normal. The right ventricular  size is normal.  3. Left atrial size was mild to moderately dilated.  4. The mitral valve is normal in structure. Mild mitral valve  regurgitation. No evidence of mitral stenosis.  5. The aortic valve is tricuspid. Aortic valve regurgitation is not  visualized. No aortic stenosis is present.  6. The inferior vena cava is normal in size with greater than 50%  respiratory variability, suggesting right atrial pressure of 3 mmHg.   FINDINGS  Left Ventricle: Left ventricular ejection fraction, by estimation, is 65  to 70%. The left ventricle has normal function. The left ventricle has no  regional wall motion abnormalities. The left ventricular internal cavity  size was normal in size. There is  mild left ventricular hypertrophy. Left ventricular  diastolic parameters  are consistent with Grade I diastolic dysfunction (impaired relaxation).  Normal left ventricular filling pressure.   Right Ventricle: The right ventricular size is normal. No increase in  right ventricular wall thickness. Right ventricular systolic function is  normal.   Left Atrium: Left atrial size was  mild to moderately dilated.   Right Atrium: Right atrial size was normal in size.   Pericardium: There is no evidence of pericardial effusion.   Mitral Valve: The mitral valve is normal in structure. Mild mitral valve  regurgitation. No evidence of mitral valve stenosis.   Tricuspid Valve: The tricuspid valve is normal in structure. Tricuspid  valve regurgitation is trivial. No evidence of tricuspid stenosis.   Aortic Valve: The aortic valve is tricuspid. Aortic valve regurgitation is  not visualized. No aortic stenosis is present. Aortic valve mean gradient  measures 4.7 mmHg. Aortic valve peak gradient measures 9.8 mmHg. Aortic  valve area, by VTI measures 2.97  cm.   Pulmonic Valve: The pulmonic valve was not well visualized. Pulmonic valve  regurgitation is not visualized. No evidence of pulmonic stenosis.   Aorta: The aortic root is normal in size and structure.   Pulmonary Artery: Indeterminant PASP, inadequate TR jet.   Venous: The inferior vena cava is normal in size with greater than 50%  respiratory variability, suggesting right atrial pressure of 3 mmHg.   IAS/Shunts: No atrial level shunt detected by color flow Doppler.    Cardiac cath 07/2012  Impression: 1. Double vessel CAD with patent stents in the proximal and mid LAD with diffuse small vessel disease in LAD and Circumflex 2. Preserved LV systolic function  ASSESSMENT AND PLAN:  1.  Post hospitalization due to colitis and GI bleed.  Stable currently  Follows up with GI on Friday - labs done at Dr. Michelle Nasuti will ask for copy.   Follow up in 6 months here unless problems.   2.  CAD of native heat without angina -- with allergy to ASA pt is on effient daily and Imdur and BB.  3.  HLD continue statin last LDL 128 in 12/2019 seems elevated on 80 mg of lipitor will check with pt if taking the 80 mg if so then add zetia.  HL 41  4.  HTN now back on pre hospital meds and BP controlled.    Current medicines are reviewed with the patient today.  The patient Has no concerns regarding medicines.  The following changes have been made:  See above Labs/ tests ordered today include:see above  Disposition:   FU:  see above  Signed, Nada Boozer, NP  02/01/2020 3:51 PM    [facility] [address] [place], [place], [place]  [address]/ [address] [place], [place] Phone: [phone]; Fax: [phone]  [phone]

## 2020-02-01 ENCOUNTER — Encounter: Payer: Self-pay | Admitting: Cardiology

## 2020-02-01 ENCOUNTER — Other Ambulatory Visit: Payer: Self-pay | Admitting: Cardiology

## 2020-02-01 ENCOUNTER — Other Ambulatory Visit: Payer: Self-pay

## 2020-02-01 ENCOUNTER — Ambulatory Visit (INDEPENDENT_AMBULATORY_CARE_PROVIDER_SITE_OTHER): Payer: Medicare Other | Admitting: Cardiology

## 2020-02-01 VITALS — BP 124/76 | HR 75 | Ht 65.0 in | Wt 180.0 lb

## 2020-02-01 DIAGNOSIS — K529 Noninfective gastroenteritis and colitis, unspecified: Secondary | ICD-10-CM

## 2020-02-01 DIAGNOSIS — I2510 Atherosclerotic heart disease of native coronary artery without angina pectoris: Secondary | ICD-10-CM

## 2020-02-01 DIAGNOSIS — E782 Mixed hyperlipidemia: Secondary | ICD-10-CM

## 2020-02-01 DIAGNOSIS — I10 Essential (primary) hypertension: Secondary | ICD-10-CM

## 2020-02-01 DIAGNOSIS — I209 Angina pectoris, unspecified: Secondary | ICD-10-CM

## 2020-02-01 MED ORDER — EZETIMIBE 10 MG PO TABS: 10.0000 mg | ORAL_TABLET | Freq: Every day | ORAL | 3 refills | Status: DC

## 2020-02-01 NOTE — Addendum Note (Signed)
Addended by: Cleda Mccreedy on: 02/01/2020 05:18 PM   Modules accepted: Orders

## 2020-02-01 NOTE — Patient Instructions (Addendum)
Medication Instructions:  Your physician has recommended you make the following change in your medication:  1) Start taking ZETIA 10mg  daily  *If you need a refill on your cardiac medications before your next appointment, please call your pharmacy*   Lab Work: Hepatic and Lipid panel in 8 weeks  If you have labs (blood work) drawn today and your tests are completely normal, you will receive your results only by: MyChart Message (if you have MyChart) OR . A paper copy in the mail If you have any lab test that is abnormal or we need to change your treatment, we will call you to review the results.   Testing/Procedures: None today   Follow-Up: At [facility], you and your health needs are our priority.  As part of our continuing mission to provide you with exceptional heart care, we have created designated Provider Care Teams.  These Care Teams include your primary Cardiologist (physician) and Advanced Practice Providers (APPs -  Physician Assistants and Nurse Practitioners) who all work together to provide you with the care you need, when you need it.  We recommend signing up for the patient portal called "MyChart".  Sign up information is provided on this After Visit Summary.  MyChart is used to connect with patients for Virtual Visits (Telemedicine).  Patients are able to view lab/test results, encounter notes, upcoming appointments, etc.  Non-urgent messages can be sent to your provider as well.   To learn more about what you can do with MyChart, go to [facility].    Your next appointment:   6 month(s)  The format for your next appointment:   In Person  Provider:   [email or web address], MD   Other Instructions None      Thank you for choosing [facility] !

## 2020-02-01 NOTE — Addendum Note (Signed)
Addended by: Cleda Mccreedy on: 02/01/2020 05:14 PM   Modules accepted: Orders

## 2020-03-04 ENCOUNTER — Ambulatory Visit (INDEPENDENT_AMBULATORY_CARE_PROVIDER_SITE_OTHER): Payer: Medicare Other | Admitting: Gastroenterology

## 2020-03-04 ENCOUNTER — Other Ambulatory Visit: Payer: Self-pay

## 2020-03-04 ENCOUNTER — Encounter: Payer: Self-pay | Admitting: Gastroenterology

## 2020-03-04 DIAGNOSIS — K50111 Crohn's disease of large intestine with rectal bleeding: Secondary | ICD-10-CM | POA: Diagnosis not present

## 2020-03-04 DIAGNOSIS — I209 Angina pectoris, unspecified: Secondary | ICD-10-CM | POA: Diagnosis not present

## 2020-03-04 DIAGNOSIS — K5010 Crohn's disease of large intestine without complications: Secondary | ICD-10-CM | POA: Insufficient documentation

## 2020-03-04 NOTE — Progress Notes (Addendum)
Referring Provider: Gareth Morgan, MD Primary Care Physician:  Gareth Morgan, MD  Primary GI: Dr. Jena Gauss   Chief Complaint  Patient presents with  . Rectal Bleeding    none since hospitalization back in May and had TCS done  . Abdominal Pain    none recently    HPI:   Bradley Hurley is a 74 y.o. male presenting today in hospital follow-up. Admitted May 2021 with abdominal pain, intermittent hematochezia and loose stools. Underwent colonoscopy while inpatient with findings of segmental inflammatory change of the proximal sigmoid most consistent with segmental colitis associated with diverticula rather than ischemic, doubt IBD. Distal TI normal. Mildly active colitis with mild chronic changes on biopsies. Prescribed Cipro and Flagyl.    Has noted timing of Effient affects how much bleeding he has. Stool is starting to firm up. Will sometimes see some blood in stool but doesn't cloud the water. Took Effient yesterday, had BM today with no blood. 50-60% of time stool is normal and doesn't see blood. Very tiny clots about size of half a pea occasionally. No gas. Denies constipation. No abdominal pain. No straining. No rectal pain.   Appetite is not the best at times. Only certain foods he wants. Sense of taste decreased. No GERD. Present for past 4-5 months. Purposefully trying to lose weight.   177 today. Was 180 inpatient. He states he has been in the mid 180s for several years now. High of 210 many years ago. Again states he has been slowly trying to lose.   Past Medical History:  Diagnosis Date  . Anxiety   . CAD (coronary artery disease)    DES to prox-mid LAD 2013  . Cholelithiasis   . Depression   . Deviated septum   . Diverticulosis   . Erectile dysfunction    Low serum testosterone level  . Essential hypertension   . Hemorrhoid   . Hyperlipidemia   . MI (myocardial infarction) (HCC)    Anterior STEMI 8/13  . Obesity   . Obstructive sleep apnea   . Tinnitus      Past Surgical History:  Procedure Laterality Date  . APPENDECTOMY    . BIOPSY  01/15/2020   Procedure: BIOPSY;  Surgeon: Corbin Ade, MD;  [facility];  Service: Endoscopy;;  . CARDIAC CATHETERIZATION  07/28/2012   20% distal left main, patent prox & mid LAD stents w/ 25% ISR of the latter (unchanged from 03/2012 cath, mid-LAD stent placed in 2003), 30-40% ostial LCx, 40-50% distal marginal, 30% distal serial RCA stenoses; LVEF 55% with apical HK  . COLONOSCOPY N/A 08/12/2013   rourk: anal canal hemorrhoids (minimal), abnormal sigmoid colon with diverticula and mucosal edema and submucosal petechia (bx showed Mild changes of diverticular colitis or diverticulitis. next tcs in 10 years.   . COLONOSCOPY N/A 01/15/2020   segmental inflammatory change of the proximal sigmoid most consistent with segmental colitis associated with diverticula rather than ischemic, doubt IBD. Distal TI normal. Mildly active colitis with mild chronic changes on biopsies.  . CORONARY ANGIOPLASTY WITH STENT PLACEMENT  2003   mid LAD  . CORONARY ANGIOPLASTY WITH STENT PLACEMENT  03/2012   STEMI s/p DES-prox LAD  . LEFT HEART CATHETERIZATION WITH CORONARY ANGIOGRAM N/A 04/10/2012   Procedure: LEFT HEART CATHETERIZATION WITH CORONARY ANGIOGRAM;  Surgeon: Kathleene Hazel, MD;  Location: [facility] CATH LAB;  Service: Cardiovascular;  Laterality: N/A;  . LEFT HEART CATHETERIZATION WITH CORONARY ANGIOGRAM N/A 07/28/2012   Procedure: LEFT  HEART CATHETERIZATION WITH CORONARY ANGIOGRAM;  Surgeon: Kathleene Hazel, MD;  Location: [facility] CATH LAB;  Service: Cardiovascular;  Laterality: N/A;  . 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 TONSILLECTOMY      Current Outpatient Medications  Medication Sig Dispense Refill  . atorvastatin (LIPITOR) 80 MG tablet Take 1 tablet (80 mg total) by mouth every evening. 90 tablet 3  . carvedilol (COREG) 6.25 MG tablet TAKE 1 TABLET TWICE A DAY WITH FOOD (Patient taking  differently: Take 6.25 mg by mouth 2 (two) times daily with a meal. ) 60 tablet 6  . ezetimibe (ZETIA) 10 MG tablet Take 1 tablet (10 mg total) by mouth daily. 90 tablet 3  . isosorbide mononitrate (IMDUR) 30 MG 24 hr tablet Take 1 tablet (30 mg total) by mouth daily. 90 tablet 3  . lisinopril (ZESTRIL) 40 MG tablet Take 40 mg by mouth daily.    Marland Kitchen LORazepam (ATIVAN) 1 MG tablet Take 1 mg by mouth at bedtime.     . Multiple Vitamins-Minerals (MEGA MULTI MEN PO) Take 1 tablet by mouth daily as needed (energy).    . nitroGLYCERIN (NITROSTAT) 0.4 MG SL tablet Place 1 tablet (0.4 mg total) under the tongue every 5 (five) minutes as needed for chest pain. 25 tablet 3  . prasugrel (EFFIENT) 10 MG TABS tablet Take 1 tablet (10 mg total) by mouth daily. 30 tablet 6   No current facility-administered medications for this visit.    Allergies as of 03/04/2020 - Review Complete 03/04/2020  Allergen Reaction Noted  . Aspirin Anaphylaxis 04/09/2012  . Nsaids  01/14/2020    Family History  Problem Relation Age of Onset  . Cancer Mother   . Arrhythmia Father     Social History   Socioeconomic History  . Marital status: Married    Spouse name: Not on file  . Number of children: Not on file  . Years of education: Not on file  . Highest education level: Not on file  Occupational History  . Occupation: Probation officer    Comment: self-employed  Tobacco Use  . Smoking status: Former Smoker    Packs/day: 1.00    Years: 15.00    Pack years: 15.00    Types: Cigarettes    Quit date: 09/03/1995    Years since quitting: 24.5  . Smokeless tobacco: Former Neurosurgeon    Quit date: 01/30/1981  . Tobacco comment: Quit in 1982  Vaping Use  . Vaping Use: Never used  Substance and Sexual Activity  . Alcohol use: Yes    Alcohol/week: 1.0 standard drink    Types: 1 Standard drinks or equivalent per week    Comment: occ  . Drug use: No  . Sexual activity: Yes  Other Topics Concern  . 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   Social Determinants of Health   Financial Resource Strain:   . Difficulty of Paying Living Expenses:   Food Insecurity:   . Worried About Programme researcher, broadcasting/film/video in the Last Year:   . Barista in the Last Year:   Transportation Needs:   . Freight forwarder (Medical):   Marland Kitchen Lack of Transportation (Non-Medical):   Physical Activity:   . Days of Exercise per Week:   . Minutes of Exercise per Session:   Stress:   . Feeling of Stress :   Social Connections:   .  Frequency of Communication with Friends and Family:   . Frequency of Social Gatherings with Friends and Family:   . Attends Religious Services:   . Active Member of Clubs or Organizations:   . Attends Banker Meetings:   Marland Kitchen Marital Status:     Review of Systems: Gen: Denies fever, chills, anorexia. Denies fatigue, weakness, weight loss.  CV: Denies chest pain, palpitations, syncope, peripheral edema, and claudication. Resp: Denies dyspnea at rest, cough, wheezing, coughing up blood, and pleurisy. GI: see HPI Derm: Denies rash, itching, dry skin Psych: Denies depression, anxiety, memory loss, confusion. No homicidal or suicidal ideation.  Heme: see HPI  Physical Exam: BP (!) 149/86   Pulse 71   Temp (!) 97.3 F (36.3 C)   Ht 5\' 5"  (1.651 m)   Wt 177 lb 6.4 oz (80.5 kg)   BMI 29.52 kg/m  General:   Alert and oriented. No distress noted. Pleasant and cooperative.  Head:  Normocephalic and atraumatic. Eyes:  Conjuctiva clear without scleral icterus. Mouth:  Mask in place Abdomen:  +BS, soft, non-tender and non-distended. No rebound or guarding. No HSM. Query ventral hernia vs rectus diastasis Msk:  Symmetrical without gross deformities. Normal posture. Extremities:  Without edema. Neurologic:  Alert and  oriented x4 Psych:  Alert and cooperative. Normal mood and affect.  ASSESSMENT: Bradley Hurley is a 74 y.o. male presenting  today with history of segmental colitis in May 2021 likely due to diverticulosis, s/p colonoscopy May 2021 while inpatient. He completed course of Cipro and Flagyl with good response. Returning today with significant improvement in symptoms.  Occasional very low-volume hematochezia in setting of Effient noted; however, he states 50-60% of the time no blood is present.  Will need to recheck CBC to ensure improving. He also has vague decreased appetite but no dysphagia, GERD, N/V, and he notes after taking his medications he feels less of an appetite. I do note he has been very slowly losing weight over the past several years but reports this is purposeful. I feel we need to keep a close eye on this and ensure weight is stable and appetite remains appropriate, so we will see him back in 4 months.    PLAN:  Request CBC from Dr. June 2021: if none recent, needs updated (received dated June 2021 after hospitalization, Hgb 12.4, improved).   High fiber diet  Return in 4 months  Call if recurrent abdominal pain or rectal bleeding  July 2021, PhD, ANP-BC [facility]

## 2020-03-04 NOTE — Patient Instructions (Addendum)
I would like to see you back in 4 months. Please call if worsening rectal bleeding or abdominal pain.  You may follow a high fiber diet now; I have attached this here.  Please call if any concerns in the meantime!  It was a pleasure to see you today. I want to create trusting relationships with patients to provide genuine, compassionate, and quality care. I value your feedback. If you receive a survey regarding your visit,  I greatly appreciate you taking time to fill this out.   Bradley Mink, PhD, ANP-BC [facility]    High-Fiber Diet Fiber, also called dietary fiber, is a type of carbohydrate that is found in fruits, vegetables, whole grains, and beans. A high-fiber diet can have many health benefits. Your health care provider may recommend a high-fiber diet to help:  Prevent constipation. Fiber can make your bowel movements more regular.  Lower your cholesterol.  Relieve the following conditions: ? Swelling of veins in the anus (hemorrhoids). ? Swelling and irritation (inflammation) of specific areas of the digestive tract (uncomplicated diverticulosis). ? A problem of the large intestine (colon) that sometimes causes pain and diarrhea (irritable bowel syndrome, IBS).  Prevent overeating as part of a weight-loss plan.  Prevent heart disease, type 2 diabetes, and certain cancers. What is my plan? The recommended daily fiber intake in grams (g) includes:  38 g for men age 75 or younger.  30 g for men over age 10.  25 g for women age 40 or younger.  21 g for women over age 36. You can get the recommended daily intake of dietary fiber by:  Eating a variety of fruits, vegetables, grains, and beans.  Taking a fiber supplement, if it is not possible to get enough fiber through your diet. What do I need to know about a high-fiber diet?  It is better to get fiber through food sources rather than from fiber supplements. There is not a lot of research about how  effective supplements are.  Always check the fiber content on the nutrition facts label of any prepackaged food. Look for foods that contain 5 g of fiber or more per serving.  Talk with a diet and nutrition specialist (dietitian) if you have questions about specific foods that are recommended or not recommended for your medical condition, especially if those foods are not listed below.  Gradually increase how much fiber you consume. If you increase your intake of dietary fiber too quickly, you may have bloating, cramping, or gas.  Drink plenty of water. Water helps you to digest fiber. What are tips for following this plan?  Eat a wide variety of high-fiber foods.  Make sure that half of the grains that you eat each day are whole grains.  Eat breads and cereals that are made with whole-grain flour instead of refined flour or white flour.  Eat brown rice, bulgur wheat, or millet instead of white rice.  Start the day with a breakfast that is high in fiber, such as a cereal that contains 5 g of fiber or more per serving.  Use beans in place of meat in soups, salads, and pasta dishes.  Eat high-fiber snacks, such as berries, raw vegetables, nuts, and popcorn.  Choose whole fruits and vegetables instead of processed forms like juice or sauce. What foods can I eat?  Fruits Berries. Pears. Apples. Oranges. Avocado. Prunes and raisins. Dried figs. Vegetables Sweet potatoes. Spinach. Kale. Artichokes. Cabbage. Broccoli. Cauliflower. Green peas. Carrots. Squash. Grains Whole-grain  breads. Multigrain cereal. Oats and oatmeal. Brown rice. Barley. Bulgur wheat. Millet. Quinoa. Bran muffins. Popcorn. Rye wafer crackers. Meats and other proteins Navy, kidney, and pinto beans. Soybeans. Split peas. Lentils. Nuts and seeds. Dairy Fiber-fortified yogurt. Beverages Fiber-fortified soy milk. Fiber-fortified orange juice. Other foods Fiber bars. The items listed above may not be a complete list  of recommended foods and beverages. Contact a dietitian for more options. What foods are not recommended? Fruits Fruit juice. Cooked, strained fruit. Vegetables Fried potatoes. Canned vegetables. Well-cooked vegetables. Grains White bread. Pasta made with refined flour. White rice. Meats and other proteins Fatty cuts of meat. Fried chicken or fried fish. Dairy Milk. Yogurt. Cream cheese. Sour cream. Fats and oils Butters. Beverages Soft drinks. Other foods Cakes and pastries. The items listed above may not be a complete list of foods and beverages to avoid. Contact a dietitian for more information. Summary  Fiber is a type of carbohydrate. It is found in fruits, vegetables, whole grains, and beans.  There are many health benefits of eating a high-fiber diet, such as preventing constipation, lowering blood cholesterol, helping with weight loss, and reducing your risk of heart disease, diabetes, and certain cancers.  Gradually increase your intake of fiber. Increasing too fast can result in cramping, bloating, and gas. Drink plenty of water while you increase your fiber.  The best sources of fiber include whole fruits and vegetables, whole grains, nuts, seeds, and beans. This information is not intended to replace advice given to you by your health care provider. Make sure you discuss any questions you have with your health care provider. Document Revised: 06/17/2017 Document Reviewed: 06/17/2017 Elsevier Patient Education  2020 [employer].

## 2020-04-15 ENCOUNTER — Ambulatory Visit
Admission: EM | Admit: 2020-04-15 | Discharge: 2020-04-15 | Disposition: A | Discharge status: Home / Self Care | Payer: Medicare Other | Attending: Family Medicine | Admitting: Family Medicine

## 2020-04-15 ENCOUNTER — Other Ambulatory Visit: Payer: Self-pay

## 2020-04-15 ENCOUNTER — Encounter: Payer: Self-pay | Admitting: Emergency Medicine

## 2020-04-15 DIAGNOSIS — R05 Cough: Secondary | ICD-10-CM

## 2020-04-15 DIAGNOSIS — Z1152 Encounter for screening for COVID-19: Secondary | ICD-10-CM | POA: Diagnosis not present

## 2020-04-15 DIAGNOSIS — B349 Viral infection, unspecified: Secondary | ICD-10-CM | POA: Diagnosis not present

## 2020-04-15 DIAGNOSIS — R519 Headache, unspecified: Secondary | ICD-10-CM | POA: Diagnosis not present

## 2020-04-15 DIAGNOSIS — Z20822 Contact with and (suspected) exposure to covid-19: Secondary | ICD-10-CM

## 2020-04-15 DIAGNOSIS — R059 Cough, unspecified: Secondary | ICD-10-CM

## 2020-04-15 MED ORDER — BENZONATATE 100 MG PO CAPS
100.0000 mg | ORAL_CAPSULE | Freq: Three times a day (TID) | ORAL | 0 refills | Status: DC
Start: 2020-04-15 — End: 2020-07-05

## 2020-04-15 MED ORDER — PREDNISONE 10 MG (21) PO TBPK: ORAL_TABLET | Freq: Every day | ORAL | 0 refills | Status: AC

## 2020-04-15 MED ORDER — ALBUTEROL SULFATE HFA 108 (90 BASE) MCG/ACT IN AERS: 2.0000 | INHALATION_SPRAY | RESPIRATORY_TRACT | 0 refills | Status: AC | PRN

## 2020-04-15 NOTE — ED Triage Notes (Addendum)
Headache ,feels feverish, throat is mildly sore and nasal drainage. Has not had covid vaccines.

## 2020-04-15 NOTE — Discharge Instructions (Addendum)
Your COVID test is pending.  You should self quarantine until the test result is back.    Take Tylenol as needed for fever or discomfort.  Rest and keep yourself hydrated.    Go to the emergency department if you develop acute worsening symptoms.     

## 2020-04-15 NOTE — ED Provider Notes (Signed)
[facility]   [id number] 04/15/20 Arrival Time: 1604   CC: COVID symptoms  SUBJECTIVE: History from: patient  .  Marguarite ArbourRobert R Kot is a 74 y.o. male who presents with abrupt onset of nasal congestion, PND, and persistent dry cough for 2 days. Reports that his grandchildren and his wife are positive for Covid. Denies recent travel. Has negative history of Covid. Has not completed Covid vaccines. Has not taken OTC medications for this. There are no aggravating or alleviating factors. Denies previous symptoms in the past. Denies fever, chills, fatigue, sinus pain, rhinorrhea, sore throat, SOB, wheezing, chest pain, nausea, changes in bowel or bladder habits.    ROS: As per HPI.  All other pertinent ROS negative.     Past Medical History:  Diagnosis Date  . Anxiety   . CAD (coronary artery disease)    DES to prox-mid LAD 2013  . Cholelithiasis   . Depression   . Deviated septum   . Diverticulosis   . Erectile dysfunction    Low serum testosterone level  . Essential hypertension   . Hemorrhoid   . Hyperlipidemia   . MI (myocardial infarction) (HCC)    Anterior STEMI 8/13  . Obesity   . Obstructive sleep apnea   . Tinnitus    Past Surgical History:  Procedure Laterality Date  . APPENDECTOMY    . BIOPSY  01/15/2020   Procedure: BIOPSY;  Surgeon: Corbin Adeourk, Noeh M, MD;  Location: [facility];  Service: Endoscopy;;  . CARDIAC CATHETERIZATION  07/28/2012   20% distal left main, patent prox & mid LAD stents w/ 25% ISR of the latter (unchanged from 03/2012 cath, mid-LAD stent placed in 2003), 30-40% ostial LCx, 40-50% distal marginal, 30% distal serial RCA stenoses; LVEF 55% with apical HK  . COLONOSCOPY N/A 08/12/2013   rourk: anal canal hemorrhoids (minimal), abnormal sigmoid colon with diverticula and mucosal edema and submucosal petechia (bx showed Mild changes of diverticular colitis or diverticulitis. next tcs in 10 years.   . COLONOSCOPY N/A 01/15/2020   segmental  inflammatory change of the proximal sigmoid most consistent with segmental colitis associated with diverticula rather than ischemic, doubt IBD. Distal TI normal. Mildly active colitis with mild chronic changes on biopsies.  . CORONARY ANGIOPLASTY WITH STENT PLACEMENT  2003   mid LAD  . CORONARY ANGIOPLASTY WITH STENT PLACEMENT  03/2012   STEMI s/p DES-prox LAD  . LEFT HEART CATHETERIZATION WITH CORONARY ANGIOGRAM N/A 04/10/2012   Procedure: LEFT HEART CATHETERIZATION WITH CORONARY ANGIOGRAM;  Surgeon: Kathleene Hazelhristopher D McAlhany, MD;  [facility];  Service: Cardiovascular;  Laterality: N/A;  . LEFT HEART CATHETERIZATION WITH CORONARY ANGIOGRAM N/A 07/28/2012   Procedure: LEFT HEART CATHETERIZATION WITH CORONARY ANGIOGRAM;  Surgeon: Kathleene Hazelhristopher D McAlhany, MD;  [facility];  Service: Cardiovascular;  Laterality: N/A;  . 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 TONSILLECTOMY     Allergies  Allergen Reactions  . Aspirin Anaphylaxis    Throat closes  . Nsaids    No current facility-administered medications on file prior to encounter.   Current Outpatient Medications on File Prior to Encounter  Medication Sig Dispense Refill  . atorvastatin (LIPITOR) 80 MG tablet Take 1 tablet (80 mg total) by mouth every evening. 90 tablet 3  . carvedilol (COREG) 6.25 MG tablet TAKE 1 TABLET TWICE A DAY WITH FOOD (Patient taking differently: Take 6.25 mg by mouth 2 (two) times daily with a meal. ) 60 tablet 6  .  ezetimibe (ZETIA) 10 MG tablet Take 1 tablet (10 mg total) by mouth daily. 90 tablet 3  . isosorbide mononitrate (IMDUR) 30 MG 24 hr tablet Take 1 tablet (30 mg total) by mouth daily. 90 tablet 3  . lisinopril (ZESTRIL) 40 MG tablet Take 40 mg by mouth daily.    Marland Kitchen LORazepam (ATIVAN) 1 MG tablet Take 1 mg by mouth at bedtime.     . Multiple Vitamins-Minerals (MEGA MULTI MEN PO) Take 1 tablet by mouth daily as needed (energy).    . nitroGLYCERIN (NITROSTAT) 0.4 MG SL tablet Place 1  tablet (0.4 mg total) under the tongue every 5 (five) minutes as needed for chest pain. 25 tablet 3  . prasugrel (EFFIENT) 10 MG TABS tablet Take 1 tablet (10 mg total) by mouth daily. 30 tablet 6   Social History   Socioeconomic History  . Marital status: Married    Spouse name: Not on file  . Number of children: Not on file  . Years of education: Not on file  . Highest education level: Not on file  Occupational History  . Occupation: Probation officer    Comment: self-employed  Tobacco Use  . Smoking status: Former Smoker    Packs/day: 1.00    Years: 15.00    Pack years: 15.00    Types: Cigarettes    Quit date: 09/03/1995    Years since quitting: 24.6  . Smokeless tobacco: Former Neurosurgeon    Quit date: 01/30/1981  . Tobacco comment: Quit in 1982  Vaping Use  . Vaping Use: Never used  Substance and Sexual Activity  . Alcohol use: Yes    Alcohol/week: 1.0 standard drink    Types: 1 Standard drinks or equivalent per week    Comment: occ  . Drug use: No  . Sexual activity: Yes  Other Topics Concern  . 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   Social Determinants of Health   Financial Resource Strain:   . Difficulty of Paying Living Expenses: Not on file  Food Insecurity:   . Worried About Programme researcher, broadcasting/film/video in the Last Year: Not on file  . Ran Out of Food in the Last Year: Not on file  Transportation Needs:   . Lack of Transportation (Medical): Not on file  . Lack of Transportation (Non-Medical): Not on file  Physical Activity:   . Days of Exercise per Week: Not on file  . Minutes of Exercise per Session: Not on file  Stress:   . Feeling of Stress : Not on file  Social Connections:   . Frequency of Communication with Friends and Family: Not on file  . Frequency of Social Gatherings with Friends and Family: Not on file  . Attends Religious Services: Not on file  . Active Member of Clubs or Organizations: Not on file  . Attends  Banker Meetings: Not on file  . Marital Status: Not on file  Intimate Partner Violence:   . Fear of Current or Ex-Partner: Not on file  . Emotionally Abused: Not on file  . Physically Abused: Not on file  . Sexually Abused: Not on file   Family History  Problem Relation Age of Onset  . Cancer Mother   . Arrhythmia Father     OBJECTIVE:  Vitals:   04/15/20 1717 04/15/20 1719  BP:  (!) 172/82  Pulse:  98  Resp:  18  Temp:  99.1 F (37.3 C)  TempSrc:  Oral  SpO2:  97%  Weight: 185 lb (83.9 kg)   Height: 5\' 4"  (1.626 m)      General appearance: alert; appears fatigued, but nontoxic; speaking in full sentences and tolerating own secretions HEENT: NCAT; Ears: EACs clear, TMs pearly gray; Eyes: PERRL.  EOM grossly intact. Sinuses: nontender; Nose: nares patent without rhinorrhea, Throat: oropharynx clear, tonsils non erythematous or enlarged, uvula midline  Neck: supple without LAD Lungs: unlabored respirations, symmetrical air entry; cough: mild; no respiratory distress; CTAB Heart: regular rate and rhythm.  Radial pulses 2+ symmetrical bilaterally Skin: warm and dry Psychological: alert and cooperative; normal mood and affect  LABS:  No results found for this or any previous visit (from the past 24 hour(s)).   ASSESSMENT & PLAN:  1. Cough   2. Nonintractable headache, unspecified chronicity pattern, unspecified headache type   3. Exposure to COVID-19 virus   4. Encounter for screening for COVID-19     Meds ordered this encounter  Medications  . albuterol (VENTOLIN HFA) 108 (90 Base) MCG/ACT inhaler    Sig: Inhale 2 puffs into the lungs every 4 (four) hours as needed for wheezing or shortness of breath.    Dispense:  18 g    Refill:  0    Order Specific Question:   Supervising Provider    Answer:   Merrilee Jansky  . benzonatate (TESSALON) 100 MG capsule    Sig: Take 1 capsule (100 mg total) by mouth every 8 (eight) hours.    Dispense:   21 capsule    Refill:  0    Order Specific Question:   Supervising Provider    Answer:   [id number] Merrilee Jansky  . predniSONE (STERAPRED UNI-PAK 21 TAB) 10 MG (21) TBPK tablet    Sig: Take by mouth daily for 6 days. Take 6 tablets on day 1, 5 tablets on day 2, 4 tablets on day 3, 3 tablets on day 4, 2 tablets on day 5, 1 tablet on day 6    Dispense:  21 tablet    Refill:  0    Order Specific Question:   Supervising Provider    Answer:   [id number] Merrilee Jansky     Prescribed Albuterol inhaler Prescribed tessalon perles Prescribed prednisone taper Most likely Covid, given symptoms and exposure. Will treat with the above symptoms given cardiac risk factors, instructed to go to the ER if any of his symptoms worsen  COVID testing ordered.  It will take between 1-2 days for test results.  Someone will contact you regarding abnormal results.    Patient should remain in quarantine until they have received Covid results.  If negative you may resume normal activities (go back to work/school) while practicing hand hygiene, social distance, and mask wearing.  If positive, patient should remain in quarantine for 10 days from symptom onset AND greater than 72 hours after symptoms resolution (absence of fever without the use of fever-reducing medication and improvement in respiratory symptoms), whichever is longer Get plenty of rest and push fluids Use OTC zyrtec for nasal congestion, runny nose, and/or sore throat Use OTC flonase for nasal congestion and runny nose Use medications daily for symptom relief Use OTC medications like ibuprofen or tylenol as needed fever or pain Call or go to the ED if you have any new or worsening symptoms such as fever, worsening cough, shortness of breath, chest tightness, chest pain, turning blue, changes in mental status.  Reviewed expectations re:  course of current medical issues. Questions answered. Outlined signs and symptoms indicating need for more  acute intervention. Patient verbalized understanding. After Visit Summary given.         Moshe Cipro, NP 04/16/20 1018

## 2020-04-16 LAB — SARS-COV-2, NAA 2 DAY TAT

## 2020-04-16 LAB — NOVEL CORONAVIRUS, NAA: SARS-CoV-2, NAA: DETECTED — AB

## 2020-04-17 ENCOUNTER — Encounter: Payer: Self-pay | Admitting: Adult Health

## 2020-04-17 ENCOUNTER — Other Ambulatory Visit: Payer: Self-pay | Admitting: Adult Health

## 2020-04-17 DIAGNOSIS — U071 COVID-19: Secondary | ICD-10-CM

## 2020-04-17 NOTE — Progress Notes (Signed)
I connected by phone with Bradley Hurley on 04/17/2020 at 8:29 AM to discuss the potential use of a new treatment for mild to moderate COVID-19 viral infection in non-hospitalized patients.  This patient is a 74 y.o. male that meets the FDA criteria for Emergency Use Authorization of COVID monoclonal antibody casirivimab/imdevimab.  Has a (+) direct SARS-CoV-2 viral test result  Has mild or moderate COVID-19   Is NOT hospitalized due to COVID-19  Is within 10 days of symptom onset  Has at least one of the high risk factor(s) for progression to severe COVID-19 and/or hospitalization as defined in EUA.  Specific high risk criteria : Older age (>/= 74 yo), heart disease, HTN, bmi greater than 25   I have spoken and communicated the following to the patient or parent/caregiver regarding COVID monoclonal antibody treatment:  1. FDA has authorized the emergency use for the treatment of mild to moderate COVID-19 in adults and pediatric patients with positive results of direct SARS-CoV-2 viral testing who are 65 years of age and older weighing at least 40 kg, and who are at high risk for progressing to severe COVID-19 and/or hospitalization.  2. The significant known and potential risks and benefits of COVID monoclonal antibody, and the extent to which such potential risks and benefits are unknown.  3. Information on available alternative treatments and the risks and benefits of those alternatives, including clinical trials.  4. Patients treated with COVID monoclonal antibody should continue to self-isolate and use infection control measures (e.g., wear mask, isolate, social distance, avoid sharing personal items, clean and disinfect "high touch" surfaces, and frequent handwashing) according to CDC guidelines.   5. The patient or parent/caregiver has the option to accept or refuse COVID monoclonal antibody treatment.  After reviewing this information with the patient, The patient agreed to  proceed with receiving casirivimab\imdevimab infusion and will be provided a copy of the Fact sheet prior to receiving the infusion. [name] 04/17/2020 8:29 AM

## 2020-04-18 ENCOUNTER — Ambulatory Visit (HOSPITAL_COMMUNITY)
Admission: RE | Admit: 2020-04-18 | Discharge: 2020-04-18 | Disposition: A | Discharge status: Home / Self Care | Payer: Medicare Other | Source: Ambulatory Visit | Attending: Pulmonary Disease | Admitting: Pulmonary Disease

## 2020-04-18 DIAGNOSIS — U071 COVID-19: Secondary | ICD-10-CM | POA: Diagnosis not present

## 2020-04-18 DIAGNOSIS — Z23 Encounter for immunization: Secondary | ICD-10-CM | POA: Insufficient documentation

## 2020-04-18 MED ORDER — SODIUM CHLORIDE 0.9 % IV SOLN
1200.0000 mg | Freq: Once | INTRAVENOUS | Status: AC
  Administered 2020-04-18: 1200 mg via INTRAVENOUS
  Filled 2020-04-18: qty 10

## 2020-04-18 MED ORDER — SODIUM CHLORIDE 0.9 % IV SOLN: INTRAVENOUS | Status: DC | PRN

## 2020-04-18 MED ORDER — DIPHENHYDRAMINE HCL 50 MG/ML IJ SOLN: 50.0000 mg | Freq: Once | INTRAMUSCULAR | Status: DC | PRN

## 2020-04-18 MED ORDER — FAMOTIDINE IN NACL 20-0.9 MG/50ML-% IV SOLN: 20.0000 mg | Freq: Once | INTRAVENOUS | Status: DC | PRN

## 2020-04-18 MED ORDER — EPINEPHRINE 0.3 MG/0.3ML IJ SOAJ: 0.3000 mg | Freq: Once | INTRAMUSCULAR | Status: DC | PRN

## 2020-04-18 MED ORDER — METHYLPREDNISOLONE SODIUM SUCC 125 MG IJ SOLR: 125.0000 mg | Freq: Once | INTRAMUSCULAR | Status: DC | PRN

## 2020-04-18 MED ORDER — ALBUTEROL SULFATE HFA 108 (90 BASE) MCG/ACT IN AERS: 2.0000 | INHALATION_SPRAY | Freq: Once | RESPIRATORY_TRACT | Status: DC | PRN

## 2020-04-18 NOTE — Progress Notes (Signed)
  Diagnosis: COVID-19  Physician:Dr Delford Field  Procedure: [facility] Infusion Clinic Med: casirivimab\imdevimab infusion - Provided patient with casirivimab\imdevimab fact sheet for patients, parents and caregivers prior to infusion.  Complications: No immediate complications noted.  Discharge: Discharged home   Audrie Gallus 04/18/2020

## 2020-04-18 NOTE — Discharge Instructions (Signed)

## 2020-06-06 ENCOUNTER — Other Ambulatory Visit: Payer: Self-pay | Admitting: Family Medicine

## 2020-06-06 ENCOUNTER — Other Ambulatory Visit: Payer: Self-pay | Admitting: Cardiology

## 2020-07-05 ENCOUNTER — Other Ambulatory Visit: Payer: Self-pay

## 2020-07-05 ENCOUNTER — Encounter: Payer: Self-pay | Admitting: Gastroenterology

## 2020-07-05 ENCOUNTER — Ambulatory Visit (INDEPENDENT_AMBULATORY_CARE_PROVIDER_SITE_OTHER): Payer: Medicare Other | Admitting: Gastroenterology

## 2020-07-05 VITALS — BP 142/90 | HR 77 | Temp 97.1°F | Ht 65.0 in | Wt 174.0 lb

## 2020-07-05 DIAGNOSIS — I209 Angina pectoris, unspecified: Secondary | ICD-10-CM

## 2020-07-05 DIAGNOSIS — K5010 Crohn's disease of large intestine without complications: Secondary | ICD-10-CM | POA: Diagnosis not present

## 2020-07-05 NOTE — Patient Instructions (Signed)
Please call if any concerns, rectal bleeding, abdominal pain, unexplained weight loss.  We will see you back as needed!  Have a safe holiday season!  I enjoyed seeing you again today! As you know, I value our relationship and want to provide genuine, compassionate, and quality care. I welcome your feedback. If you receive a survey regarding your visit,  I greatly appreciate you taking time to fill this out. See you next time!  Gelene Mink, PhD, ANP-BC [facility]

## 2020-07-05 NOTE — Progress Notes (Signed)
Referring Provider: Gareth Morgan, MD Primary Care Physician:  Gareth Morgan, MD Primary GI: Dr. Jena Gauss    Chief Complaint  Patient presents with  . Rectal Bleeding    none    HPI:   Bradley Hurley is a 74 y.o. 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 Completed course of Cipro and Flagyl with good response. On Effient. Slow weight loss over past several years with plateau in the 180s, with last weight 177 in July 2021. Today 174.   When had Covid, went down to the 160s. Now increasing weight. Had the monoclonal infusion. Felt much better after that. Appetite is much better now. No dysphagia. Rare reflux. No abdominal pain. Good appetite. No N/V. No overt GI bleeding. Denies constipation or diarrhea. Feels well from a GI standpoint.    Past Medical History:  Diagnosis Date  . Anxiety   . CAD (coronary artery disease)    DES to prox-mid LAD 2013  . Cholelithiasis   . Depression   . Deviated septum   . Diverticulosis   . Erectile dysfunction    Low serum testosterone level  . Essential hypertension   . Hemorrhoid   . Hyperlipidemia   . MI (myocardial infarction) (HCC)    Anterior STEMI 8/13  . Obesity   . Obstructive sleep apnea   . Tinnitus     Past Surgical History:  Procedure Laterality Date  . APPENDECTOMY    . BIOPSY  01/15/2020   Procedure: BIOPSY;  Surgeon: Corbin Ade, MD;  [facility]: [facility];  Service: Endoscopy;;  . CARDIAC CATHETERIZATION  07/28/2012   20% distal left main, patent prox & mid LAD stents w/ 25% ISR of the latter (unchanged from 03/2012 cath, mid-LAD stent placed in 2003), 30-40% ostial LCx, 40-50% distal marginal, 30% distal serial RCA stenoses; LVEF 55% with apical HK  . COLONOSCOPY N/A 08/12/2013   rourk: anal canal hemorrhoids (minimal), abnormal sigmoid colon with diverticula and mucosal edema and  submucosal petechia (bx showed Mild changes of diverticular colitis or diverticulitis. next tcs in 10 years.   . COLONOSCOPY N/A 01/15/2020   segmental inflammatory change of the proximal sigmoid most consistent with segmental colitis associated with diverticula rather than ischemic, doubt IBD. Distal TI normal. Mildly active colitis with mild chronic changes on biopsies.  . CORONARY ANGIOPLASTY WITH STENT PLACEMENT  2003   mid LAD  . CORONARY ANGIOPLASTY WITH STENT PLACEMENT  03/2012   STEMI s/p DES-prox LAD  . LEFT HEART CATHETERIZATION WITH CORONARY ANGIOGRAM N/A 04/10/2012   Procedure: LEFT HEART CATHETERIZATION WITH CORONARY ANGIOGRAM;  Surgeon: Kathleene Hazel, MD;  Location: [facility] CATH LAB;  Service: Cardiovascular;  Laterality: N/A;  . LEFT HEART CATHETERIZATION WITH CORONARY ANGIOGRAM N/A 07/28/2012   Procedure: LEFT HEART CATHETERIZATION WITH CORONARY ANGIOGRAM;  Surgeon: Kathleene Hazel, MD;  Location: [facility] CATH LAB;  Service: Cardiovascular;  Laterality: N/A;  . 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 TONSILLECTOMY      Current Outpatient Medications  Medication Sig Dispense Refill  . albuterol (VENTOLIN HFA) 108 (90 Base) MCG/ACT inhaler Inhale 2 puffs into the lungs every 4 (four) hours as needed for wheezing or shortness of breath. 18 g 0  . atorvastatin (LIPITOR) 80 MG tablet TAKE 1 TABLET(80 MG) BY MOUTH EVERY EVENING 90 tablet 3  . carvedilol (  COREG) 6.25 MG tablet TAKE 1 TABLET TWICE A DAY WITH FOOD (Patient taking differently: Take 6.25 mg by mouth daily. ) 60 tablet 6  . isosorbide mononitrate (IMDUR) 30 MG 24 hr tablet Take 1 tablet (30 mg total) by mouth daily. 90 tablet 3  . lisinopril (ZESTRIL) 40 MG tablet TAKE 1 TABLET(40 MG) BY MOUTH DAILY 90 tablet 3  . LORazepam (ATIVAN) 1 MG tablet Take 1 mg by mouth at bedtime.     . Multiple Vitamins-Minerals (MEGA MULTI MEN PO) Take 1 tablet by mouth daily as needed (energy).    . nitroGLYCERIN (NITROSTAT) 0.4 MG SL  tablet Place 1 tablet (0.4 mg total) under the tongue every 5 (five) minutes as needed for chest pain. 25 tablet 3  . prasugrel (EFFIENT) 10 MG TABS tablet Take 1 tablet (10 mg total) by mouth daily. 30 tablet 6  . benzonatate (TESSALON) 100 MG capsule Take 1 capsule (100 mg total) by mouth every 8 (eight) hours. (Patient not taking: Reported on 07/05/2020) 21 capsule 0  . ezetimibe (ZETIA) 10 MG tablet Take 1 tablet (10 mg total) by mouth daily. (Patient not taking: Reported on 07/05/2020) 90 tablet 3   No current facility-administered medications for this visit.    Allergies as of 07/05/2020 - Review Complete 07/05/2020  Allergen Reaction Noted  . Aspirin Anaphylaxis 04/09/2012  . Nsaids  01/14/2020    Family History  Problem Relation Age of Onset  . Cancer Mother   . Arrhythmia Father     Social History   Socioeconomic History  . Marital status: Married    Spouse name: Not on file  . Number of children: Not on file  . Years of education: Not on file  . Highest education level: Not on file  Occupational History  . Occupation: Probation officer    Comment: self-employed  Tobacco Use  . Smoking status: Former Smoker    Packs/day: 1.00    Years: 15.00    Pack years: 15.00    Types: Cigarettes    Quit date: 09/03/1995    Years since quitting: 24.8  . Smokeless tobacco: Former Neurosurgeon    Quit date: 01/30/1981  . Tobacco comment: Quit in 1982  Vaping Use  . Vaping Use: Never used  Substance and Sexual Activity  . Alcohol use: Yes    Alcohol/week: 1.0 standard drink    Types: 1 Standard drinks or equivalent per week    Comment: occ  . Drug use: No  . Sexual activity: Yes  Other Topics Concern  . 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   Social Determinants of Health   Financial Resource Strain:   . Difficulty of Paying Living Expenses: Not on file  Food Insecurity:   . Worried About Programme researcher, broadcasting/film/video in the Last Year: Not on  file  . Ran Out of Food in the Last Year: Not on file  Transportation Needs:   . Lack of Transportation (Medical): Not on file  . Lack of Transportation (Non-Medical): Not on file  Physical Activity:   . Days of Exercise per Week: Not on file  . Minutes of Exercise per Session: Not on file  Stress:   . Feeling of Stress : Not on file  Social Connections:   . Frequency of Communication with Friends and Family: Not on file  . Frequency of Social Gatherings with Friends and Family: Not on file  . Attends Religious Services:  Not on file  . Active Member of Clubs or Organizations: Not on file  . Attends Banker Meetings: Not on file  . Marital Status: Not on file    Review of Systems: Gen: Denies fever, chills, anorexia. Denies fatigue, weakness, weight loss.  CV: Denies chest pain, palpitations, syncope, peripheral edema, and claudication. Resp: Denies dyspnea at rest, cough, wheezing, coughing up blood, and pleurisy. GI: see HPI  Derm: Denies rash, itching, dry skin Psych: Denies depression, anxiety, memory loss, confusion. No homicidal or suicidal ideation.  Heme: Denies bruising, bleeding, and enlarged lymph nodes.  Physical Exam: BP (!) 142/90   Pulse 77   Temp (!) 97.1 F (36.2 C)   Ht 5\' 5"  (1.651 m)   Wt 174 lb (78.9 kg)   BMI 28.96 kg/m  General:   Alert and oriented. No distress noted. Pleasant and cooperative.  Head:  Normocephalic and atraumatic. Eyes:  Conjuctiva clear without scleral icterus. Mouth:  Mask in place  Abdomen:  +BS, soft, non-tender and non-distended. No rebound or guarding. No HSM or masses noted. Msk:  Symmetrical without gross deformities. Normal posture. Extremities:  Without edema. Neurologic:  Alert and  oriented x4 Psych:  Alert and cooperative. Normal mood and affect.  ASSESSMENT/PLAN: MATHIUS BIRKELAND is a 74 y.o. male presenting today with history of segmental colitis earlier this year, doing well now without concerning upper  or lower GI signs/symptoms.   Early interval follow-up today due to history of slow weight loss (states purposeful) over past several years and had been in the mid 180s for several years. 177 at last visit, and 174 today. However, he notes with COVID-19 in August, he lost down to the 160s and is now regaining. Good appetite. I have asked him to call if any unintentional weight loss, rectal bleeding, abdominal pain. No further colonoscopy unless clinical changes.   We will see him back as needed.   September, PhD, ANP-BC [facility]

## 2020-07-07 NOTE — Progress Notes (Signed)
CC'ED TO PCP 

## 2020-08-08 ENCOUNTER — Other Ambulatory Visit: Payer: Self-pay

## 2020-08-08 ENCOUNTER — Encounter: Payer: Self-pay | Admitting: Cardiology

## 2020-08-08 ENCOUNTER — Ambulatory Visit (INDEPENDENT_AMBULATORY_CARE_PROVIDER_SITE_OTHER): Payer: Medicare Other | Admitting: Cardiology

## 2020-08-08 VITALS — BP 96/66 | HR 78 | Resp 16 | Ht 65.0 in | Wt 174.2 lb

## 2020-08-08 DIAGNOSIS — I25119 Atherosclerotic heart disease of native coronary artery with unspecified angina pectoris: Secondary | ICD-10-CM

## 2020-08-08 DIAGNOSIS — I10 Essential (primary) hypertension: Secondary | ICD-10-CM

## 2020-08-08 DIAGNOSIS — I209 Angina pectoris, unspecified: Secondary | ICD-10-CM

## 2020-08-08 DIAGNOSIS — E782 Mixed hyperlipidemia: Secondary | ICD-10-CM | POA: Diagnosis not present

## 2020-08-08 NOTE — Progress Notes (Signed)
Cardiology Office Note  Date: 08/08/2020   ID: Bradley Hurley, Bradley Hurley 20-Mar-1946, MRN [id number]  PCP:  Gareth Morgan, MD  Cardiologist:  Nona Dell, MD Electrophysiologist:  None   Chief Complaint  Patient presents with  . Follow-up    6 mth    History of Present Illness: Bradley Hurley is a 75 y.o. male last seen in June by Ms. Ingold NP.  He presents for a routine visit.  No angina symptoms on medical therapy.  He has been playing golf when the weather allows.  Reports no progressive breathlessness.  Echocardiogram in May of this year revealed LVEF 65 to 70% with mild diastolic dysfunction.  He continues to follow with gastroenterology, history of segmental colitis in May of this year.  No abdominal pain.  Blood pressure was low normal today.  He does have an automatic cuff at home, but has not been checking his blood pressure regularly.  I asked him to keep an eye on this in case lisinopril needs to be down titrated.  Past Medical History:  Diagnosis Date  . Anxiety   . CAD (coronary artery disease)    DES to prox-mid LAD 2013  . Cholelithiasis   . Depression   . Deviated septum   . Diverticulosis   . Erectile dysfunction    Low serum testosterone level  . Essential hypertension   . Hemorrhoid   . Hyperlipidemia   . MI (myocardial infarction) (HCC)    Anterior STEMI 8/13  . Obesity   . Obstructive sleep apnea   . Tinnitus     Past Surgical History:  Procedure Laterality Date  . APPENDECTOMY    . BIOPSY  01/15/2020   Procedure: BIOPSY;  Surgeon: Corbin Ade, MD;  [facility]: [facility];  Service: Endoscopy;;  . CARDIAC CATHETERIZATION  07/28/2012   20% distal left main, patent prox & mid LAD stents w/ 25% ISR of the latter (unchanged from 03/2012 cath, mid-LAD stent placed in 2003), 30-40% ostial LCx, 40-50% distal marginal, 30% distal serial RCA stenoses; LVEF 55% with apical HK  . COLONOSCOPY N/A 08/12/2013   rourk: anal canal hemorrhoids  (minimal), abnormal sigmoid colon with diverticula and mucosal edema and submucosal petechia (bx showed Mild changes of diverticular colitis or diverticulitis. next tcs in 10 years.   . COLONOSCOPY N/A 01/15/2020   segmental inflammatory change of the proximal sigmoid most consistent with segmental colitis associated with diverticula rather than ischemic, doubt IBD. Distal TI normal. Mildly active colitis with mild chronic changes on biopsies.  . CORONARY ANGIOPLASTY WITH STENT PLACEMENT  2003   mid LAD  . CORONARY ANGIOPLASTY WITH STENT PLACEMENT  03/2012   STEMI s/p DES-prox LAD  . LEFT HEART CATHETERIZATION WITH CORONARY ANGIOGRAM N/A 04/10/2012   Procedure: LEFT HEART CATHETERIZATION WITH CORONARY ANGIOGRAM;  Surgeon: Kathleene Hazel, MD;  Location: [facility] CATH LAB;  Service: Cardiovascular;  Laterality: N/A;  . LEFT HEART CATHETERIZATION WITH CORONARY ANGIOGRAM N/A 07/28/2012   Procedure: LEFT HEART CATHETERIZATION WITH CORONARY ANGIOGRAM;  Surgeon: Kathleene Hazel, MD;  Location: [facility] CATH LAB;  Service: Cardiovascular;  Laterality: N/A;  . 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 TONSILLECTOMY      Current Outpatient Medications  Medication Sig Dispense Refill  . albuterol (VENTOLIN HFA) 108 (90 Base) MCG/ACT inhaler Inhale 2 puffs into the lungs every 4 (four) hours as needed for wheezing or shortness of breath. 18 g 0  . atorvastatin (  LIPITOR) 80 MG tablet TAKE 1 TABLET(80 MG) BY MOUTH EVERY EVENING 90 tablet 3  . carvedilol (COREG) 6.25 MG tablet TAKE 1 TABLET TWICE A DAY WITH FOOD (Patient taking differently: Take 6.25 mg by mouth daily.) 60 tablet 6  . isosorbide mononitrate (IMDUR) 30 MG 24 hr tablet Take 1 tablet (30 mg total) by mouth daily. 90 tablet 3  . lisinopril (ZESTRIL) 40 MG tablet TAKE 1 TABLET(40 MG) BY MOUTH DAILY 90 tablet 3  . LORazepam (ATIVAN) 1 MG tablet Take 1 mg by mouth at bedtime.     . Multiple Vitamins-Minerals (MEGA MULTI MEN PO) Take 1 tablet by  mouth daily as needed (energy).    . nitroGLYCERIN (NITROSTAT) 0.4 MG SL tablet Place 1 tablet (0.4 mg total) under the tongue every 5 (five) minutes as needed for chest pain. 25 tablet 3  . prasugrel (EFFIENT) 10 MG TABS tablet Take 1 tablet (10 mg total) by mouth daily. 30 tablet 6   No current facility-administered medications for this visit.   Allergies:  Aspirin and Nsaids   ROS: No palpitations or syncope.  Physical Exam: VS:  BP 96/66   Pulse 78   Resp 16   Ht 5\' 5"  (1.651 m)   Wt 174 lb 3.2 oz (79 kg)   SpO2 97%   BMI 28.99 kg/m , BMI Body mass index is 28.99 kg/m.  Wt Readings from Last 3 Encounters:  08/08/20 174 lb 3.2 oz (79 kg)  07/05/20 174 lb (78.9 kg)  04/15/20 185 lb (83.9 kg)    General: Patient appears comfortable at rest. HEENT: Conjunctiva and lids normal, wearing a mask. Neck: Supple, no elevated JVP or carotid bruits, no thyromegaly. Lungs: Clear to auscultation, nonlabored breathing at rest. Cardiac: Regular rate and rhythm, no S3 or significant systolic murmur. Extremities: No pitting edema.  ECG:  An ECG dated 01/13/2020 was personally reviewed today and demonstrated:  Sinus rhythm with nonspecific ST changes.  Recent Labwork: 01/13/2020: ALT 18; AST 16 01/16/2020: BUN 13; Creatinine, Ser 1.02; Hemoglobin 10.3; Magnesium 1.8; Platelets 288; Potassium 4.0; Sodium 138     Component Value Date/Time   CHOL 180 01/13/2020 2308   TRIG 56 01/13/2020 2308   HDL 41 01/13/2020 2308   CHOLHDL 4.4 01/13/2020 2308   VLDL 11 01/13/2020 2308   LDLCALC 128 (H) 01/13/2020 2308    Other Studies Reviewed Today:  Echocardiogram 01/14/2020: 1. Left ventricular ejection fraction, by estimation, is 65 to 70%. The  left ventricle has normal function. The left ventricle has no regional  wall motion abnormalities. There is mild left ventricular hypertrophy.  Left ventricular diastolic parameters  are consistent with Grade I diastolic dysfunction (impaired  relaxation).  2. Right ventricular systolic function is normal. The right ventricular  size is normal.  3. Left atrial size was mild to moderately dilated.  4. The mitral valve is normal in structure. Mild mitral valve  regurgitation. No evidence of mitral stenosis.  5. The aortic valve is tricuspid. Aortic valve regurgitation is not  visualized. No aortic stenosis is present.  6. The inferior vena cava is normal in size with greater than 50%  respiratory variability, suggesting right atrial pressure of 3 mmHg.   Assessment and Plan:  1.  CAD status post DES to the proximal and mid LAD in 2013.  He does not report any active angina at this time and we continue medical therapy and observation.  Current medications include Effient, Lipitor, Coreg, Imdur, lisinopril, and as needed  nitroglycerin.  2.  Mixed hyperlipidemia on Lipitor.  Last LDL was 128.  Would plan to recheck FLP around the time of his next visit.  3.  Essential hypertension.  Blood pressure low normal today.  I have asked him to check with his automatic cuff at home, if systolic is consistently running in 90-110 range, reduce lisinopril to 20 mg daily.  Medication Adjustments/Labs and Tests Ordered: Current medicines are reviewed at length with the patient today.  Concerns regarding medicines are outlined above.   Tests Ordered: No orders of the defined types were placed in this encounter.   Medication Changes: No orders of the defined types were placed in this encounter.   Disposition:  Follow up 6 months.  Signed, Jonelle Sidle, MD, [facility] 08/08/2020 4:01 PM    [facility] [address] [place], [place], [place] [address] Phone: [phone]; Fax: [phone]

## 2020-08-08 NOTE — Patient Instructions (Addendum)
Medication Instructions:   Your physician recommends that you continue on your current medications as directed. Please refer to the Current Medication list given to you today.  Labwork:  None  Testing/Procedures:  None  Follow-Up:  Your physician recommends that you schedule a follow-up appointment in: 6 months.  Any Other Special Instructions Will Be Listed Below (If Applicable). Your physician has requested that you regularly monitor and record your blood pressure readings at home. Please use the same machine at the same time of day to check your readings and record them.  Contact our office if your blood pressure continues to be low so that we an adjust your lisinopril dose  If you need a refill on your cardiac medications before your next appointment, please call your pharmacy.

## 2020-09-24 ENCOUNTER — Other Ambulatory Visit: Payer: Self-pay | Admitting: Cardiology

## 2021-01-27 ENCOUNTER — Ambulatory Visit: Payer: Medicare Other | Admitting: Cardiology

## 2021-02-22 ENCOUNTER — Other Ambulatory Visit: Payer: Self-pay

## 2021-02-22 ENCOUNTER — Encounter: Payer: Self-pay | Admitting: Student

## 2021-02-22 ENCOUNTER — Ambulatory Visit (INDEPENDENT_AMBULATORY_CARE_PROVIDER_SITE_OTHER): Payer: Medicare Other | Admitting: Student

## 2021-02-22 VITALS — BP 160/90 | HR 58 | Ht 64.0 in | Wt 175.4 lb

## 2021-02-22 DIAGNOSIS — I2510 Atherosclerotic heart disease of native coronary artery without angina pectoris: Secondary | ICD-10-CM | POA: Diagnosis not present

## 2021-02-22 DIAGNOSIS — I10 Essential (primary) hypertension: Secondary | ICD-10-CM | POA: Diagnosis not present

## 2021-02-22 DIAGNOSIS — E785 Hyperlipidemia, unspecified: Secondary | ICD-10-CM

## 2021-02-22 DIAGNOSIS — G4733 Obstructive sleep apnea (adult) (pediatric): Secondary | ICD-10-CM | POA: Diagnosis not present

## 2021-02-22 NOTE — Progress Notes (Signed)
Cardiology Office Note    Date:  02/22/2021   ID:  Bradley Hurley, Bradley Hurley 10/29/1945, MRN [id number]  PCP:  Gareth Morgan, MD  Cardiologist: Nona Dell, MD    Chief Complaint  Patient presents with   Follow-up    6 month visit    History of Present Illness:    Bradley Hurley is a 75 y.o. male with past medical history of CAD (s/p DES to LAD in 2003, anterior STEMI with DESx2 to prox-mid LAD in 2013, low-risk NST in 12/2016), HTN, HLD, OSA (intolerant to CPAP), anxiety, depression and history of GIB who presents to the office today for 50-month follow-up.  He was last examined Dr. Diona Browner in 07/2020 and denied any recent progressive angina. He was continued on his current cardiac medications including Effient, Lipitor, Coreg, Imdur and Lisinopril. He was encouraged to follow BP readings at home and reduce Lisinopril to 20 mg daily if BP remained soft (was at 96/66 during his visit).  In talking with the patient today, he reports overall doing well since his last office visit. He does report occasional discomfort along his sternum which he feels is likely acid reflux as symptoms typically resolve with Tums but he does take SL NTGx1 at times and symptoms have resolved as well. Reports having done this approximately 4 times over the past 6+ months. No exertional symptoms. He does not exercise regularly but reports staying active at baseline as he typically does yard work and also enjoys golfing at least once a week. He denies any recent orthopnea, PND or lower extremity edema.  His blood pressure is elevated today but he did not take his medications this morning and believes he might have forgotten them last night as well. [name] was also his birthday and he reports consuming foods high in sodium, including pizza.   Past Medical History:  Diagnosis Date   Anxiety    CAD (coronary artery disease)    a. s/p DES to LAD in 2003 b. anterior STEMI with DESx2 to prox-mid LAD in 2013 c.  low-risk NST in 12/2016   Cholelithiasis    Depression    Deviated septum    Diverticulosis    Erectile dysfunction    Low serum testosterone level   Essential hypertension    Hemorrhoid    Hyperlipidemia    MI (myocardial infarction) (HCC)    Anterior STEMI 8/13   Obesity    Obstructive sleep apnea    Tinnitus     Past Surgical History:  Procedure Laterality Date   APPENDECTOMY     BIOPSY  01/15/2020   Procedure: BIOPSY;  Surgeon: Corbin Ade, MD;  Location: [facility];  Service: Endoscopy;;   CARDIAC CATHETERIZATION  07/28/2012   20% distal left main, patent prox & mid LAD stents w/ 25% ISR of the latter (unchanged from 03/2012 cath, mid-LAD stent placed in 2003), 30-40% ostial LCx, 40-50% distal marginal, 30% distal serial RCA stenoses; LVEF 55% with apical HK   COLONOSCOPY N/A 08/12/2013   rourk: anal canal hemorrhoids (minimal), abnormal sigmoid colon with diverticula and mucosal edema and submucosal petechia (bx showed Mild changes of diverticular colitis or diverticulitis. next tcs in 10 years.    COLONOSCOPY N/A 01/15/2020   segmental inflammatory change of the proximal sigmoid most consistent with segmental colitis associated with diverticula rather than ischemic, doubt IBD. Distal TI normal. Mildly active colitis with mild chronic changes on biopsies.   CORONARY ANGIOPLASTY WITH STENT PLACEMENT  2003  mid LAD   CORONARY ANGIOPLASTY WITH STENT PLACEMENT  03/2012   STEMI s/p DES-prox LAD   LEFT HEART CATHETERIZATION WITH CORONARY ANGIOGRAM N/A 04/10/2012   Procedure: LEFT HEART CATHETERIZATION WITH CORONARY ANGIOGRAM;  Surgeon: Kathleene Hazel, MD;  Location: [facility] CATH LAB;  Service: Cardiovascular;  Laterality: N/A;   LEFT HEART CATHETERIZATION WITH CORONARY ANGIOGRAM N/A 07/28/2012   Procedure: LEFT HEART CATHETERIZATION WITH CORONARY ANGIOGRAM;  Surgeon: Kathleene Hazel, MD;  Location: [facility] CATH LAB;  Service: Cardiovascular;  Laterality: N/A;   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 18 g 0   atorvastatin (LIPITOR) 80 MG tablet TAKE 1 TABLET(80 MG) BY MOUTH EVERY EVENING 90 tablet 3   carvedilol (COREG) 6.25 MG tablet TAKE 1 TABLET TWICE A DAY WITH FOOD (Patient taking differently: Take 6.25 mg by mouth daily.) 60 tablet 6   isosorbide mononitrate (IMDUR) 30 MG 24 hr tablet TAKE 1 TABLET(30 MG) BY MOUTH DAILY 90 tablet 3   lisinopril (ZESTRIL) 40 MG tablet TAKE 1 TABLET(40 MG) BY MOUTH DAILY 90 tablet 3   LORazepam (ATIVAN) 1 MG tablet Take 1 mg by mouth at bedtime.      Multiple Vitamins-Minerals (MEGA MULTI MEN PO) Take 1 tablet by mouth daily as needed (energy).     nitroGLYCERIN (NITROSTAT) 0.4 MG SL tablet Place 1 tablet (0.4 mg total) under the tongue every 5 (five) minutes as needed for chest pain. 25 tablet 3   prasugrel (EFFIENT) 10 MG TABS tablet Take 1 tablet (10 mg total) by mouth daily. 30 tablet 6   No facility-administered medications prior to visit.     Allergies:   Aspirin and Nsaids   Social History   Socioeconomic History   Marital status: Married    Spouse name: Not on file   Number of children: Not on file   Years of education: Not on file   Highest education level: Not on file  Occupational History   Occupation: Probation officer    Comment: self-employed  Tobacco Use   Smoking status: Former    Packs/day: 1.00    Years: 15.00    Pack years: 15.00    Types: Cigarettes    Quit date: 09/03/1995    Years since quitting: 25.4   Smokeless tobacco: Former    Quit date: 01/30/1981   Tobacco comments:    Quit in 1982  Vaping Use   Vaping Use: Never used  Substance and Sexual Activity   Alcohol use: Yes    Alcohol/week: 1.0 standard drink     Types: 1 Standard drinks or equivalent per week    Comment: occ   Drug use: No   Sexual activity: Yes  Other Topics Concern   Not on file  Social History Narrative   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   Social Determinants of Health   Financial Resource Strain: Not on file  Food Insecurity: Not on file  Transportation Needs: Not on file  Physical Activity: Not on file  Stress: Not on file  Social Connections: Not on file     Family History:  The patient's family history includes Arrhythmia in his father; Cancer in his mother.  Review of Systems:    Please see the history of present illness.     All other systems reviewed and are otherwise negative except as noted above.   Physical Exam:    VS:  BP (!) 160/90   Pulse (!) 58   Ht 5\' 4"  (1.626 m)   Wt 175 lb 6.4 oz (79.6 kg)   SpO2 98%   BMI 30.11 kg/m    General: Well developed, well nourished,male appearing in no acute distress. Head: Normocephalic, atraumatic. Neck: No carotid bruits. JVD not elevated.  Lungs: Respirations regular and unlabored, without wheezes or rales.  Heart: Regular rate and rhythm. No S3 or S4.  No murmur, no rubs, or gallops appreciated. Abdomen: Appears non-distended. No obvious abdominal masses. Msk:  Strength and tone appear normal for age. No obvious joint deformities or effusions. Extremities: No clubbing or cyanosis. No pitting edema.  Distal pedal pulses are 2+ bilaterally. Neuro: Alert and oriented X 3. Moves all extremities spontaneously. No focal deficits noted. Psych:  Responds to questions appropriately with a normal affect. Skin: No rashes or lesions noted  Wt Readings from Last 3 Encounters:  02/22/21 175 lb 6.4 oz (79.6 kg)  08/08/20 174 lb 3.2 oz (79 kg)  07/05/20 174 lb (78.9 kg)     Studies/Labs Reviewed:   EKG:  EKG is ordered today.  The ekg ordered today demonstrates sinus bradycardia, HR 58 with PAC's and no acute ST changes when compared to prior tracings.    Recent Labs: No results found for requested labs within last 8760 hours.   Lipid Panel    Component Value Date/Time   CHOL 180 01/13/2020 2308   TRIG 56 01/13/2020 2308   HDL 41 01/13/2020 2308   CHOLHDL 4.4 01/13/2020 2308   VLDL 11 01/13/2020 2308   LDLCALC 128 (H) 01/13/2020 2308    Additional studies/ records that were reviewed today include:   NST: 12/2016  There was no ST segment deviation noted during stress. This is a low risk study. The left ventricular ejection fraction is mildly decreased (45-54%).   1. EF 52%, mild diffuse hypokinesis. 2. Primarily fixed medium-sized basal to mid inferior and basal to mid inferoseptal perfusion defect.  There does not appear to be significant ischemia.  Possible prior infarction versus diaphragmatic attenuation.   Given lack of ischemia, overall low risk study.  Echocardiogram: 12/2019 IMPRESSIONS     1. Left ventricular ejection fraction, by estimation, is 65 to 70%. The  left ventricle has normal function. The left ventricle has no regional  wall motion abnormalities. There is mild left ventricular hypertrophy.  Left ventricular diastolic parameters  are consistent with Grade I diastolic dysfunction (impaired relaxation).   2. Right ventricular systolic function is normal. The right ventricular  size is normal.   3. Left atrial size was mild to moderately dilated.   4. The mitral valve is normal in structure. Mild mitral valve  regurgitation. No evidence of mitral stenosis.   5. The aortic valve is tricuspid. Aortic valve regurgitation is not  visualized. No aortic stenosis is present.   6. The inferior vena cava is normal in size with greater than 50%  respiratory variability, suggesting right atrial pressure of 3 mmHg.    Assessment:    1. Coronary artery disease involving native coronary artery of native heart without angina pectoris   2. Essential hypertension   3. Hyperlipidemia LDL goal <70   4. OSA  (obstructive sleep apnea)  Plan:   In order of problems listed above:  1. CAD - He is s/p DES to LAD in 2003 and anterior STEMI with DESx2 to prox-mid LAD in 2013. Most recent ischemic evaluation was a low-risk NST in 12/2016. - He does report occasional episodes of sternal discomfort which he feels are likely due to acid reflux as they occur after food consumption. No recent change in his symptoms. He denies any exertional chest pain. - Will continue with his current medical therapy including Effient 10 mg daily (allergic to ASA), Atorvastatin 80 mg daily, Coreg 6.25 mg twice daily and Imdur 30 mg daily. I encouraged him to reach out if his symptoms increase in frequency or severity as Imdur could be further titrated or repeat ischemic evaluation could be arranged.  2. HTN - His BP is elevated at 160/90 during today's visit with similar values by repeat check. He does not believe he took his medications last night or this morning and reports having increased sodium intake yesterday as outlined above. I encouraged him to follow readings at home and report back if they remain above goal. He was provided with a BP log. - Will continue his current regimen for now with Coreg 6.25 mg twice daily, Imdur 30 mg daily and Lisinopril 40 mg daily.  3. HLD - Followed by his PCP. Will request a copy of most recent labs. He remains on Atorvastatin 80 mg daily with goal LDL less than 70 in the setting of known CAD.  4. OSA - Says he did not tolerate CPAP in the past and has not used for years. Could also be contributing to his HTN.    Medication Adjustments/Labs and Tests Ordered: Current medicines are reviewed at length with the patient today.  Concerns regarding medicines are outlined above.  Medication changes, Labs and Tests ordered today are listed in the Patient Instructions below. Patient Instructions  Medication Instructions:  Your physician recommends that you continue on your current  medications as directed. Please refer to the Current Medication list given to you today.  Your physician has requested that you regularly monitor and record your blood pressure readings at home. Please use the same machine at the same time of day to check your readings and record them to bring to your follow-up visit.  *If you need a refill on your cardiac medications before your next appointment, please call your pharmacy*   Lab Work: NONE   If you have labs (blood work) drawn today and your tests are completely normal, you will receive your results only by: MyChart Message (if you have MyChart) OR A paper copy in the mail If you have any lab test that is abnormal or we need to change your treatment, we will call you to review the results.   Testing/Procedures: NONE    Follow-Up: At [facility], you and your health needs are our priority.  As part of our continuing mission to provide you with exceptional heart care, we have created designated Provider Care Teams.  These Care Teams include your primary Cardiologist (physician) and Advanced Practice Providers (APPs -  Physician Assistants and Nurse Practitioners) who all work together to provide you with the care you need, when you need it.  We recommend signing up for the patient portal called "MyChart".  Sign up information is provided on this After Visit Summary.  MyChart is used to connect with patients for Virtual Visits (Telemedicine).  Patients are able to view lab/test results, encounter notes, upcoming appointments,  etc.  Non-urgent messages can be sent to your provider as well.   To learn more about what you can do with MyChart, go to [email or web address].    Your next appointment:   6 month(s)  The format for your next appointment:   In Person  Provider:   Nona Dell, MD   Other Instructions Thank you for choosing [facility]!     Signed, Ellsworth Lennox, PA-C  02/22/2021 4:58 PM    [facility] [address] [place], [place] [address] Phone: [phone] Fax: [phone]

## 2021-02-22 NOTE — Patient Instructions (Signed)
Medication Instructions:  Your physician recommends that you continue on your current medications as directed. Please refer to the Current Medication list given to you today.  Your physician has requested that you regularly monitor and record your blood pressure readings at home. Please use the same machine at the same time of day to check your readings and record them to bring to your follow-up visit.  *If you need a refill on your cardiac medications before your next appointment, please call your pharmacy*   Lab Work: NONE   If you have labs (blood work) drawn today and your tests are completely normal, you will receive your results only by: MyChart Message (if you have MyChart) OR A paper copy in the mail If you have any lab test that is abnormal or we need to change your treatment, we will call you to review the results.   Testing/Procedures: NONE    Follow-Up: At [facility], you and your health needs are our priority.  As part of our continuing mission to provide you with exceptional heart care, we have created designated Provider Care Teams.  These Care Teams include your primary Cardiologist (physician) and Advanced Practice Providers (APPs -  Physician Assistants and Nurse Practitioners) who all work together to provide you with the care you need, when you need it.  We recommend signing up for the patient portal called "MyChart".  Sign up information is provided on this After Visit Summary.  MyChart is used to connect with patients for Virtual Visits (Telemedicine).  Patients are able to view lab/test results, encounter notes, upcoming appointments, etc.  Non-urgent messages can be sent to your provider as well.   To learn more about what you can do with MyChart, go to [email or web address].    Your next appointment:   6 month(s)  The format for your next appointment:   In Person  Provider:   Nona Dell, MD   Other Instructions Thank you for choosing [facility]!

## 2021-04-15 ENCOUNTER — Other Ambulatory Visit: Payer: Self-pay | Admitting: Cardiology

## 2021-04-19 ENCOUNTER — Telehealth: Payer: Self-pay | Admitting: Student

## 2021-04-19 MED ORDER — NITROGLYCERIN 0.4 MG SL SUBL: 0.4000 mg | SUBLINGUAL_TABLET | SUBLINGUAL | 3 refills | Status: DC | PRN

## 2021-04-19 NOTE — Telephone Encounter (Signed)
Refill complete 

## 2021-04-19 NOTE — Telephone Encounter (Signed)
New message     *STAT* If patient is at the pharmacy, call can be transferred to refill team.   1. Which medications need to be refilled? (please list name of each medication and dose if known) nitroglycerin   2. Which pharmacy/location (including street and city if local pharmacy) is medication to be sent to? [facility] [address]   3. Do they need a 30 day or 90 day supply? 30

## 2021-07-04 ENCOUNTER — Telehealth: Payer: Self-pay

## 2021-07-04 MED ORDER — CARVEDILOL 6.25 MG PO TABS
6.2500 mg | ORAL_TABLET | Freq: Two times a day (BID) | ORAL | 6 refills | Status: DC
Start: 2021-07-04 — End: 2022-08-03

## 2021-07-04 NOTE — Telephone Encounter (Signed)
Medication refill request approved for Carvedilol 6.25 mg tablets approved and sent to pharmacy.

## 2021-07-16 ENCOUNTER — Other Ambulatory Visit: Payer: Self-pay | Admitting: Cardiology

## 2021-08-08 DIAGNOSIS — E7800 Pure hypercholesterolemia, unspecified: Secondary | ICD-10-CM | POA: Diagnosis not present

## 2021-08-08 DIAGNOSIS — R351 Nocturia: Secondary | ICD-10-CM | POA: Diagnosis not present

## 2021-08-08 DIAGNOSIS — Z9861 Coronary angioplasty status: Secondary | ICD-10-CM | POA: Diagnosis not present

## 2021-08-08 DIAGNOSIS — I2510 Atherosclerotic heart disease of native coronary artery without angina pectoris: Secondary | ICD-10-CM | POA: Diagnosis not present

## 2021-09-30 IMAGING — DX DG CHEST 1V PORT
1 series · 1 of 1 positions shown · non-contrast
Comparison: Chest radiographs 07/03/2016 and earlier.

CLINICAL DATA: 73-year-old male with acute chest pain. Former
smoker.

EXAM:
PORTABLE CHEST 1 VIEW

[chest ap]
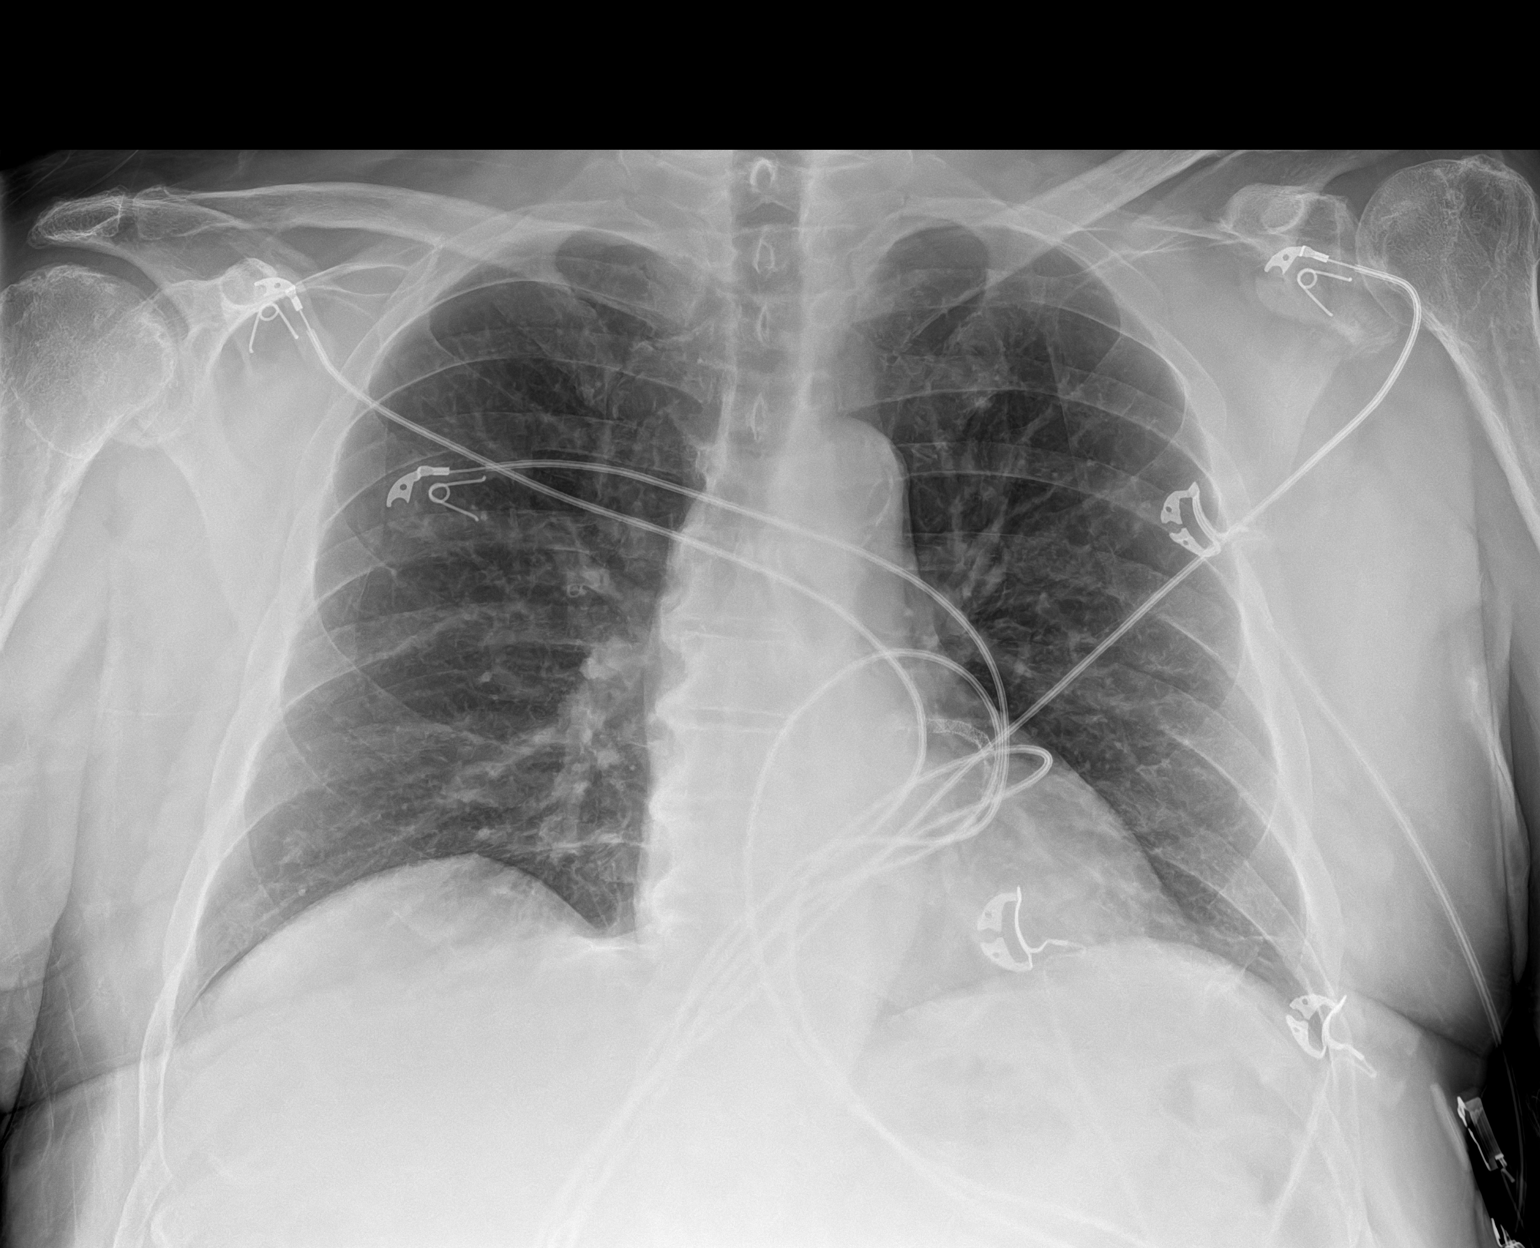

[1 of 1 positions shown; findings below may reference images not displayed]

FINDINGS: Portable AP upright view at 5163 hours. Lung volumes and mediastinal
contours remain normal. Left coronary artery calcified plaque and/or
stent. Visualized tracheal air column is within normal limits. No
pneumothorax. Allowing for portable technique the lungs are clear.
Negative visible bowel gas pattern. No acute osseous abnormality
identified.
IMPRESSION: Negative portable chest.

## 2021-11-08 ENCOUNTER — Telehealth: Payer: Self-pay | Admitting: Cardiology

## 2021-11-08 MED ORDER — ATORVASTATIN CALCIUM 80 MG PO TABS: ORAL_TABLET | ORAL | 3 refills | Status: AC

## 2021-11-08 NOTE — Telephone Encounter (Signed)
Bradley Hurley walks in her said he needs a refill for Atorvastatin he has called [facility]. Has not been able to get through to [place], so he has went 5 days without taking this prescription. Explained to him he was over due for a recall. Scheduled him next available 02/07/2022. TIA  ?

## 2021-11-08 NOTE — Telephone Encounter (Signed)
Medication refill request approved for Atorvastatin 80 mg tablets and sent to [place] per patient request.  ?

## 2021-11-19 ENCOUNTER — Other Ambulatory Visit: Payer: Self-pay | Admitting: Cardiology

## 2021-11-20 ENCOUNTER — Other Ambulatory Visit: Payer: Self-pay | Admitting: Cardiology

## 2022-02-06 NOTE — Progress Notes (Unsigned)
Cardiology Office Note    Date:  02/07/2022   ID:  [facility] Hurley, DOB [birth year], MRN [id number]  PCP:  Lemmie Evens, MD  Cardiologist: Rozann Lesches, MD    Chief Complaint  Patient presents with   Follow-up    Annual Visit    History of Present Illness:    [facility] Hurley is a 76 y.o. male with past medical history of CAD (s/p DES to LAD in 2003, anterior STEMI with DESx2 to prox-mid LAD in 2013, low-risk NST in 12/2016), HTN, HLD, OSA (intolerant to CPAP), anxiety and history of GIB who presents to the office today for overdue 60-month follow-up.  He was last examined by myself in 01/2021 and reported occasional discomfort which he felt was likely due to acid reflux as his symptoms would resolve with Tums. He denied any recent exertional chest pain. He was continued on his current medical therapy including Effient 10 mg daily (on this due to allergy to ASA), Atorvastatin, Coreg and Imdur and he was encouraged to reach out if his episodes of pain increased in frequency or severity as Imdur could be further titrated or repeat ischemic evaluation arranged.  In talking with the patient today, he reports overall doing well from a cardiac perspective since his last office visit. He continues to work routinely as him and his wife operate the [employer]. He denies any recent chest pain or dyspnea on exertion. No recent palpitations, orthopnea, PND or pitting edema.  He remains on Effient and reports easy bruising with this but no melena or hematochezia.   Past Medical History:  Diagnosis Date   Anxiety    CAD (coronary artery disease)    a. s/p DES to LAD in 2003 b. anterior STEMI with DESx2 to prox-mid LAD in 2013 c. low-risk NST in 12/2016   Cholelithiasis    Depression    Deviated septum    Diverticulosis    Erectile dysfunction    Low serum testosterone level   Essential hypertension    Hemorrhoid    Hyperlipidemia    MI (myocardial infarction) ([name])     Anterior STEMI 8/13   Obesity    Obstructive sleep apnea    Tinnitus     Past Surgical History:  Procedure Laterality Date   APPENDECTOMY     BIOPSY  01/15/2020   Procedure: BIOPSY;  Surgeon: Daneil Dolin, MD;  Location: [facility];  Service: Endoscopy;;   CARDIAC CATHETERIZATION  07/28/2012   20% distal left main, patent prox & mid LAD stents w/ 25% ISR of the latter (unchanged from 03/2012 cath, mid-LAD stent placed in 2003), 30-40% ostial LCx, 40-50% distal marginal, 30% distal serial RCA stenoses; LVEF 55% with apical HK   COLONOSCOPY N/A 08/12/2013   rourk: anal canal hemorrhoids (minimal), abnormal sigmoid colon with diverticula and mucosal edema and submucosal petechia (bx showed Mild changes of diverticular colitis or diverticulitis. next tcs in 10 years.    COLONOSCOPY N/A 01/15/2020   segmental inflammatory change of the proximal sigmoid most consistent with segmental colitis associated with diverticula rather than ischemic, doubt IBD. Distal TI normal. Mildly active colitis with mild chronic changes on biopsies.   CORONARY ANGIOPLASTY WITH STENT PLACEMENT  2003   mid LAD   CORONARY ANGIOPLASTY WITH STENT PLACEMENT  03/2012   STEMI s/p DES-prox LAD   LEFT HEART CATHETERIZATION WITH CORONARY ANGIOGRAM N/A 04/10/2012   Procedure: LEFT HEART CATHETERIZATION WITH CORONARY ANGIOGRAM;  Surgeon: Burnell Blanks, MD;  Location: [facility];  Service: Cardiovascular;  Laterality: N/A;   LEFT HEART CATHETERIZATION WITH CORONARY ANGIOGRAM N/A 07/28/2012   Procedure: LEFT HEART CATHETERIZATION WITH CORONARY ANGIOGRAM;  Surgeon: Burnell Blanks, MD;  Location: [facility] CATH LAB;  Service: Cardiovascular;  Laterality: N/A;   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 60 tablet 6   isosorbide mononitrate (IMDUR) 30 MG 24 hr tablet TAKE 1 TABLET(30 MG) BY MOUTH DAILY 90 tablet 3   lisinopril (ZESTRIL) 40 MG tablet TAKE 1 TABLET(40 MG) BY MOUTH DAILY 90 tablet 3   LORazepam (ATIVAN) 1 MG tablet Take 1 mg by mouth at bedtime.      Multiple Vitamins-Minerals (MEGA MULTI MEN PO) Take 1 tablet by mouth daily as needed (energy).     nitroGLYCERIN (NITROSTAT) 0.4 MG SL tablet Place 1 tablet (0.4 mg total) under the tongue every 5 (five) minutes as needed for chest pain. 25 tablet 3   prasugrel (EFFIENT) 10 MG TABS tablet TAKE 1 TABLET(10 MG) BY MOUTH DAILY 30 tablet 6   albuterol (VENTOLIN HFA) 108 (90 Base) MCG/ACT inhaler Inhale 2 puffs into the lungs every 4 (four) hours as needed for wheezing or shortness of breath. (Patient not taking: Reported on 02/07/2022) 18 g 0   No facility-administered medications prior to visit.     Allergies:   Aspirin and Nsaids   Social History   Socioeconomic History   Marital status: Married    Spouse name: Not on file   Number of children: Not on file   Years of education: Not on file   Highest education level: Not on file  Occupational History   Occupation: Scientist, forensic    Comment: self-employed  Tobacco Use   Smoking status: Former    Packs/day: 1.00    Years: 15.00    Total pack years: 15.00    Types: Cigarettes    Quit date: 09/03/1995    Years since quitting: 26.4   Smokeless tobacco: Former    Quit date: 01/30/1981   Tobacco comments:    Quit in 1982  Vaping Use   Vaping Use: Never used  Substance and Sexual Activity   Alcohol use: Not Currently    Alcohol/week: 1.0 standard drink of alcohol    Types: 1 Standard drinks or equivalent per week    Comment: occ   Drug use: No   Sexual activity: Yes  Other Topics Concern   Not on file  Social History Narrative   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    Social Determinants of Health   Financial Resource Strain: Not on file  Food Insecurity: Not on file  Transportation Needs: Not on file  Physical Activity: Not on file  Stress: Not on file  Social Connections: Not on file     Family History:  The patient's family history includes Arrhythmia in his father; Cancer in his mother.   Review of Systems:    Please see the history of present illness.     All other systems reviewed and are otherwise negative except as noted above.   Physical Exam:    VS:  BP 132/78   Pulse 64   Ht 5\' 4"  (1.626 m)   Wt 169 lb 12.8 oz (77 kg)   SpO2 98%   BMI 29.15 kg/m    General: Well developed, well nourished,male appearing in no acute distress. Head: Normocephalic, atraumatic. Neck: No carotid bruits. JVD not elevated.  Lungs: Respirations regular and unlabored, without wheezes or rales.  Heart: Regular rate and rhythm. No S3 or S4.  No murmur, no rubs, or gallops appreciated. Abdomen: Appears non-distended. No obvious abdominal masses. Msk:  Strength and tone appear normal for age. No obvious joint deformities or effusions. Extremities: No clubbing or cyanosis. No pitting edema.  Distal pedal pulses are 2+ bilaterally. Neuro: Alert and oriented X 3. Moves all extremities spontaneously. No focal deficits noted. Psych:  Responds to questions appropriately with a normal affect. Skin: No rashes or lesions noted  Wt Readings from Last 3 Encounters:  02/07/22 169 lb 12.8 oz (77 kg)  02/22/21 175 lb 6.4 oz (79.6 kg)  08/08/20 174 lb 3.2 oz (79 kg)     Studies/Labs Reviewed:   EKG:  EKG is ordered today. The ekg ordered today demonstrates normal sinus rhythm, heart rate 64 with no acute ST changes when compared to prior tracings  Recent Labs: No results found for requested labs within last 365 days.   Lipid Panel    Component Value Date/Time   CHOL 180 01/13/2020 2308   TRIG 56 01/13/2020 2308   HDL 41 01/13/2020 2308   CHOLHDL 4.4  01/13/2020 2308   VLDL 11 01/13/2020 2308   LDLCALC 128 (H) 01/13/2020 2308    Additional studies/ records that were reviewed today include:   NST: 12/2016 There was no ST segment deviation noted during stress. This is a low risk study. The left ventricular ejection fraction is mildly decreased (45-54%).   1. EF 52%, mild diffuse hypokinesis.  2. Primarily fixed medium-sized basal to mid inferior and basal to mid inferoseptal perfusion defect.  There does not appear to be significant ischemia.  Possible prior infarction versus diaphragmatic attenuation.    Given lack of ischemia, overall low risk study.   Echo: 12/2019 IMPRESSIONS     1. Left ventricular ejection fraction, by estimation, is 65 to 70%. The  left ventricle has normal function. The left ventricle has no regional  wall motion abnormalities. There is mild left ventricular hypertrophy.  Left ventricular diastolic parameters  are consistent with Grade I diastolic dysfunction (impaired relaxation).   2. Right ventricular systolic function is normal. The right ventricular  size is normal.   3. Left atrial size was mild to moderately dilated.   4. The mitral valve is normal in structure. Mild mitral valve  regurgitation. No evidence of mitral stenosis.   5. The aortic valve is tricuspid. Aortic valve regurgitation is not  visualized. No aortic stenosis is present.   6. The inferior vena cava is normal in size with greater than 50%  respiratory variability, suggesting right atrial pressure of 3 mmHg.   Assessment:    1. Coronary artery disease involving native coronary artery of native heart without angina pectoris   2. Essential hypertension   3. Hyperlipidemia LDL goal <70      Plan:   In order of problems listed above:  1. CAD - He is s/p DES to LAD in 2003 and Anterior STEMI with DESx2 to prox-mid LAD in 2013. Most recent ischemic evaluation was a low-risk NST in 12/2016. He remains active at baseline and  denies any recent anginal symptoms. Continue current medical therapy with Atorvastatin 80 mg daily, Coreg 6.25 mg twice daily, Imdur 30 mg daily and Effient 10 mg daily. He is allergic to ASA, therefore has remained on Effient.  Will request a copy of most recent labs from his PCP.  2. HTN - His BP was initially recorded at 150/80, rechecked after sitting for several minutes and improved to 132/78.  Continue current medical therapy with Coreg 6.25 mg twice daily, Imdur 30 mg daily and Lisinopril 40 mg daily.  3. HLD - Followed by his PCP. Will request a copy of most recent labs. He remains on Atorvastatin 80 mg daily.   Medication Adjustments/Labs and Tests Ordered: Current medicines are reviewed at length with the patient today.  Concerns regarding medicines are outlined above.  Medication changes, Labs and Tests ordered today are listed in the Patient Instructions below. Patient Instructions  Medication Instructions:  Your physician recommends that you continue on your current medications as directed. Please refer to the Current Medication list given to you today.  *If you need a refill on your cardiac medications before your next appointment, please call your pharmacy*   Lab Work: NONE   If you have labs (blood work) drawn today and your tests are completely normal, you will receive your results only by: [place] (if you have MyChart) OR A paper copy in the mail If you have any lab test that is abnormal or we need to change your treatment, we will call you to review the results.   Testing/Procedures: NONE    Follow-Up: At [facility], you and your health needs are our priority.  As part of our continuing mission to provide you with exceptional heart care, we have created designated Provider Care Teams.  These Care Teams include your primary Cardiologist (physician) and Advanced Practice Providers (APPs -  Physician Assistants and Nurse Practitioners) who all work together  to provide you with the care you need, when you need it.  We recommend signing up for the patient portal called "MyChart".  Sign up information is provided on this After Visit Summary.  MyChart is used to connect with patients for Virtual Visits (Telemedicine).  Patients are able to view lab/test results, encounter notes, upcoming appointments, etc.  Non-urgent messages can be sent to your provider as well.   To learn more about what you can do with MyChart, go to [email or web address].    Your next appointment:   1 year(s)  The format for your next appointment:   In Person  Provider:   Rozann Lesches, MD or Bernerd Pho, PA-C    Other Instructions Thank you for choosing [facility]!    Important Information About Sugar         Signed, Erma Heritage, PA-C  02/07/2022 2:44 PM    [place] [address] [place], [place] [address] Phone: [phone] Fax: [phone]

## 2022-02-07 ENCOUNTER — Encounter: Payer: Self-pay | Admitting: Student

## 2022-02-07 ENCOUNTER — Ambulatory Visit (INDEPENDENT_AMBULATORY_CARE_PROVIDER_SITE_OTHER): Payer: Medicare Other | Admitting: Student

## 2022-02-07 ENCOUNTER — Encounter: Payer: Self-pay | Admitting: *Deleted

## 2022-02-07 VITALS — BP 132/78 | HR 64 | Ht 64.0 in | Wt 169.8 lb

## 2022-02-07 DIAGNOSIS — I2510 Atherosclerotic heart disease of native coronary artery without angina pectoris: Secondary | ICD-10-CM | POA: Diagnosis not present

## 2022-02-07 DIAGNOSIS — I10 Essential (primary) hypertension: Secondary | ICD-10-CM | POA: Diagnosis not present

## 2022-02-07 DIAGNOSIS — E785 Hyperlipidemia, unspecified: Secondary | ICD-10-CM

## 2022-02-07 NOTE — Addendum Note (Signed)
Addended by: Mareta Chesnut G on: 02/07/2022 03:57 PM   Modules accepted: Orders  

## 2022-02-07 NOTE — Patient Instructions (Signed)
Medication Instructions:  Your physician recommends that you continue on your current medications as directed. Please refer to the Current Medication list given to you today.  *If you need a refill on your cardiac medications before your next appointment, please call your pharmacy*   Lab Work: NONE   If you have labs (blood work) drawn today and your tests are completely normal, you will receive your results only by: MyChart Message (if you have MyChart) OR A paper copy in the mail If you have any lab test that is abnormal or we need to change your treatment, we will call you to review the results.   Testing/Procedures: NONE    Follow-Up: At [facility], you and your health needs are our priority.  As part of our continuing mission to provide you with exceptional heart care, we have created designated Provider Care Teams.  These Care Teams include your primary Cardiologist (physician) and Advanced Practice Providers (APPs -  Physician Assistants and Nurse Practitioners) who all work together to provide you with the care you need, when you need it.  We recommend signing up for the patient portal called "MyChart".  Sign up information is provided on this After Visit Summary.  MyChart is used to connect with patients for Virtual Visits (Telemedicine).  Patients are able to view lab/test results, encounter notes, upcoming appointments, etc.  Non-urgent messages can be sent to your provider as well.   To learn more about what you can do with MyChart, go to [email or web address].    Your next appointment:   1 year(s)  The format for your next appointment:   In Person  Provider:   Nona Dell, MD or Randall An, PA-C    Other Instructions Thank you for choosing [facility]!    Important Information About Sugar

## 2022-05-08 DIAGNOSIS — I2510 Atherosclerotic heart disease of native coronary artery without angina pectoris: Secondary | ICD-10-CM | POA: Diagnosis not present

## 2022-05-08 DIAGNOSIS — A09 Infectious gastroenteritis and colitis, unspecified: Secondary | ICD-10-CM | POA: Diagnosis not present

## 2022-05-08 DIAGNOSIS — J189 Pneumonia, unspecified organism: Secondary | ICD-10-CM | POA: Diagnosis not present

## 2022-05-08 DIAGNOSIS — Z125 Encounter for screening for malignant neoplasm of prostate: Secondary | ICD-10-CM | POA: Diagnosis not present

## 2022-05-08 DIAGNOSIS — I10 Essential (primary) hypertension: Secondary | ICD-10-CM | POA: Diagnosis not present

## 2022-05-08 DIAGNOSIS — E7800 Pure hypercholesterolemia, unspecified: Secondary | ICD-10-CM | POA: Diagnosis not present

## 2022-05-08 DIAGNOSIS — R799 Abnormal finding of blood chemistry, unspecified: Secondary | ICD-10-CM | POA: Diagnosis not present

## 2022-05-08 DIAGNOSIS — Z79899 Other long term (current) drug therapy: Secondary | ICD-10-CM | POA: Diagnosis not present

## 2022-05-08 DIAGNOSIS — E6609 Other obesity due to excess calories: Secondary | ICD-10-CM | POA: Diagnosis not present

## 2022-05-08 DIAGNOSIS — K047 Periapical abscess without sinus: Secondary | ICD-10-CM | POA: Diagnosis not present

## 2022-05-08 DIAGNOSIS — R5383 Other fatigue: Secondary | ICD-10-CM | POA: Diagnosis not present

## 2022-05-29 DIAGNOSIS — F419 Anxiety disorder, unspecified: Secondary | ICD-10-CM | POA: Diagnosis not present

## 2022-05-29 DIAGNOSIS — G4700 Insomnia, unspecified: Secondary | ICD-10-CM | POA: Diagnosis not present

## 2022-07-28 ENCOUNTER — Other Ambulatory Visit: Payer: Self-pay | Admitting: Cardiology

## 2022-08-03 ENCOUNTER — Other Ambulatory Visit: Payer: Self-pay | Admitting: Student

## 2023-01-09 DIAGNOSIS — F419 Anxiety disorder, unspecified: Secondary | ICD-10-CM | POA: Diagnosis not present

## 2023-01-09 DIAGNOSIS — G4700 Insomnia, unspecified: Secondary | ICD-10-CM | POA: Diagnosis not present

## 2023-01-09 DIAGNOSIS — Z9081 Acquired absence of spleen: Secondary | ICD-10-CM | POA: Diagnosis not present

## 2023-01-09 DIAGNOSIS — I10 Essential (primary) hypertension: Secondary | ICD-10-CM | POA: Diagnosis not present

## 2023-05-15 ENCOUNTER — Encounter: Payer: Self-pay | Admitting: Cardiology

## 2023-05-15 ENCOUNTER — Ambulatory Visit: Payer: Medicare Other | Attending: Cardiology | Admitting: Cardiology

## 2023-05-15 VITALS — BP 130/78 | HR 68 | Ht 65.0 in | Wt 172.0 lb

## 2023-05-15 DIAGNOSIS — E782 Mixed hyperlipidemia: Secondary | ICD-10-CM

## 2023-05-15 DIAGNOSIS — I25119 Atherosclerotic heart disease of native coronary artery with unspecified angina pectoris: Secondary | ICD-10-CM | POA: Diagnosis present

## 2023-05-15 DIAGNOSIS — I10 Essential (primary) hypertension: Secondary | ICD-10-CM | POA: Diagnosis present

## 2023-05-15 MED ORDER — NITROGLYCERIN 0.4 MG SL SUBL: 0.4000 mg | SUBLINGUAL_TABLET | SUBLINGUAL | 3 refills | Status: AC | PRN

## 2023-05-15 NOTE — Progress Notes (Signed)
Cardiology Office Note  Date: 05/15/2023   ID: [facility] Hurley, DOB November 30, 1945, MRN [id number]  History of Present Illness: [facility] is a 77 y.o. male last seen in June 2023 by Ms. Strader PA-C, I reviewed the note (our last visit was in 2021).  He is here for a follow-up visit.  Reestablished with the [facility] system in May.  He does not report any active angina or nitroglycerin use.  States that he has been compliant with his medications which we reviewed.  No obvious drug intolerances.  He does need a refill for fresh bottle of nitroglycerin.  He had lab work through the [facility] system in May as outlined below.  ECG today shows normal sinus rhythm with increased voltage.  Physical Exam: VS:  BP 130/78 (BP Location: Left Arm, Patient Position: Sitting, Cuff Size: Normal)   Pulse 68   Ht 5\' 5"  (1.651 m)   Wt 172 lb (78 kg)   SpO2 96%   BMI 28.62 kg/m , BMI Body mass index is 28.62 kg/m.  Wt Readings from Last 3 Encounters:  05/15/23 172 lb (78 kg)  02/07/22 169 lb 12.8 oz (77 kg)  02/22/21 175 lb 6.4 oz (79.6 kg)    General: Patient appears comfortable at rest. HEENT: Conjunctiva and lids normal. Neck: Supple, no elevated JVP or carotid bruits. Lungs: Clear to auscultation, nonlabored breathing at rest. Cardiac: Regular rate and rhythm, no S3 or significant systolic murmur. Extremities: No pitting edema.  ECG:  An ECG dated 02/07/2022 was personally reviewed today and demonstrated:  Sinus rhythm.  Labwork:  June 2021: BUN 13, creatinine 0.96, potassium 4.0, hemoglobin 12.4, platelets 466 May 2024: Cholesterol 153, triglycerides 95, HDL 42, LDL 92, hemoglobin A1c 5.7%, hemoglobin 12.8, platelets 339, magnesium 1.94  Other Studies Reviewed Today:  No interval cardiac testing for review today.  Assessment and Plan:  1.  CAD status post DES to the LAD in 2003 and anterior STEMI in 2013 with DES x 2 to the proximal to mid LAD.  LVEF 65 to 70% by  echocardiogram in May 2021.  Last formal ischemic testing was in 2018 with low risk Myoview.  ECG reviewed.  He does not report any angina or nitroglycerin use, refill provided for fresh bottle of nitroglycerin.  Plan to continue observation for now unless symptoms intervene.  He is on Effient with aspirin allergy, lisinopril, Imdur, Coreg, and Lipitor.  2.  Essential hypertension.  No changes to current regimen.  3.  Mixed hyperlipidemia.  LDL 92 in May.  Continue Lipitor 80 mg daily.  Disposition:  Follow up  1 year, sooner if needed.  Signed, Jonelle Sidle, M.D., F.A.C.C. [facility]

## 2023-05-15 NOTE — Patient Instructions (Signed)
Medication Instructions:  Your physician recommends that you continue on your current medications as directed. Please refer to the Current Medication list given to you today.   Labwork: None today  Testing/Procedures: None today  Follow-Up: 1 year  Any Other Special Instructions Will Be Listed Below (If Applicable).  If you need a refill on your cardiac medications before your next appointment, please call your pharmacy.  

## 2023-07-07 ENCOUNTER — Other Ambulatory Visit: Payer: Self-pay | Admitting: Student

## 2023-07-15 ENCOUNTER — Other Ambulatory Visit: Payer: Self-pay | Admitting: Cardiology

## 2023-07-17 ENCOUNTER — Encounter: Payer: Self-pay | Admitting: *Deleted

## 2023-08-12 ENCOUNTER — Other Ambulatory Visit: Payer: Self-pay | Admitting: Student

## 2024-06-18 ENCOUNTER — Encounter: Payer: Self-pay | Admitting: Cardiology

## 2024-06-18 ENCOUNTER — Ambulatory Visit: Attending: Cardiology | Admitting: Cardiology

## 2024-10-01 ENCOUNTER — Ambulatory Visit: Admitting: Cardiology

## 2024-12-01 ENCOUNTER — Ambulatory Visit: Admitting: Cardiology
